# Patient Record
Sex: Male | Born: 1950 | Race: Black or African American | Hispanic: No | Marital: Married | Smoking: Never smoker
Health system: Southern US, Community
[De-identification: ages and names within clinical notes are randomized; demographics above are authoritative.]

## PROBLEM LIST (undated history)

## (undated) DIAGNOSIS — E785 Hyperlipidemia, unspecified: Secondary | ICD-10-CM

## (undated) DIAGNOSIS — E119 Type 2 diabetes mellitus without complications: Secondary | ICD-10-CM

## (undated) DIAGNOSIS — I10 Essential (primary) hypertension: Secondary | ICD-10-CM

## (undated) DIAGNOSIS — M1990 Unspecified osteoarthritis, unspecified site: Secondary | ICD-10-CM

## (undated) HISTORY — DX: Essential (primary) hypertension: I10

## (undated) HISTORY — DX: Hyperlipidemia, unspecified: E78.5

## (undated) HISTORY — DX: Type 2 diabetes mellitus without complications: E11.9

## (undated) HISTORY — PX: COLONOSCOPY: SHX174

## (undated) HISTORY — DX: Unspecified osteoarthritis, unspecified site: M19.90

## (undated) HISTORY — PX: EYE SURGERY: SHX253

---

## 1999-03-08 ENCOUNTER — Ambulatory Visit (HOSPITAL_COMMUNITY): Admission: RE | Admit: 1999-03-08 | Discharge: 1999-03-08 | Payer: Self-pay | Admitting: Specialist

## 1999-03-08 ENCOUNTER — Encounter: Payer: Self-pay | Admitting: Specialist

## 2007-05-14 ENCOUNTER — Ambulatory Visit: Payer: Self-pay | Admitting: Internal Medicine

## 2008-06-05 ENCOUNTER — Ambulatory Visit: Payer: Self-pay | Admitting: Internal Medicine

## 2008-07-03 ENCOUNTER — Ambulatory Visit: Payer: Self-pay | Admitting: Internal Medicine

## 2008-07-03 LAB — CONVERTED CEMR LAB: Microalb, Ur: 2.02 mg/dL — ABNORMAL HIGH (ref 0.00–1.89)

## 2008-07-07 ENCOUNTER — Ambulatory Visit: Payer: Self-pay | Admitting: *Deleted

## 2008-07-09 ENCOUNTER — Encounter: Admission: RE | Admit: 2008-07-09 | Discharge: 2008-10-07 | Payer: Self-pay | Admitting: Internal Medicine

## 2008-09-24 ENCOUNTER — Ambulatory Visit: Payer: Self-pay | Admitting: Internal Medicine

## 2008-12-03 ENCOUNTER — Encounter: Admission: RE | Admit: 2008-12-03 | Discharge: 2008-12-03 | Payer: Self-pay | Admitting: Internal Medicine

## 2009-02-03 ENCOUNTER — Ambulatory Visit: Payer: Self-pay | Admitting: Internal Medicine

## 2009-02-25 ENCOUNTER — Encounter: Admission: RE | Admit: 2009-02-25 | Discharge: 2009-03-18 | Payer: Self-pay | Admitting: Internal Medicine

## 2009-05-18 ENCOUNTER — Encounter: Admission: RE | Admit: 2009-05-18 | Discharge: 2009-05-18 | Payer: Self-pay | Admitting: Internal Medicine

## 2009-06-16 ENCOUNTER — Ambulatory Visit: Payer: Self-pay | Admitting: Internal Medicine

## 2009-08-24 ENCOUNTER — Encounter: Admission: RE | Admit: 2009-08-24 | Discharge: 2009-08-24 | Payer: Self-pay | Admitting: Internal Medicine

## 2009-08-25 ENCOUNTER — Ambulatory Visit: Payer: Self-pay | Admitting: Internal Medicine

## 2009-09-24 DIAGNOSIS — E1165 Type 2 diabetes mellitus with hyperglycemia: Secondary | ICD-10-CM | POA: Insufficient documentation

## 2009-09-24 DIAGNOSIS — Z008 Encounter for other general examination: Secondary | ICD-10-CM | POA: Insufficient documentation

## 2009-09-24 DIAGNOSIS — E785 Hyperlipidemia, unspecified: Secondary | ICD-10-CM | POA: Insufficient documentation

## 2009-09-24 DIAGNOSIS — E119 Type 2 diabetes mellitus without complications: Secondary | ICD-10-CM | POA: Insufficient documentation

## 2009-09-24 DIAGNOSIS — I10 Essential (primary) hypertension: Secondary | ICD-10-CM | POA: Insufficient documentation

## 2009-12-10 ENCOUNTER — Encounter (INDEPENDENT_AMBULATORY_CARE_PROVIDER_SITE_OTHER): Payer: Self-pay | Admitting: Internal Medicine

## 2009-12-10 LAB — CONVERTED CEMR LAB
ALT: 33 units/L (ref 0–53)
AST: 28 units/L (ref 0–37)
Albumin: 4.3 g/dL (ref 3.5–5.2)
Alkaline Phosphatase: 72 units/L (ref 39–117)
BUN: 16 mg/dL (ref 6–23)
CO2: 29 meq/L (ref 19–32)
Calcium: 9.4 mg/dL (ref 8.4–10.5)
Chloride: 103 meq/L (ref 96–112)
Cholesterol: 169 mg/dL (ref 0–200)
Creatinine, Ser: 1.08 mg/dL (ref 0.40–1.50)
Glucose, Bld: 145 mg/dL — ABNORMAL HIGH (ref 70–99)
HDL: 28 mg/dL — ABNORMAL LOW (ref >39)
LDL Cholesterol: 119 mg/dL — ABNORMAL HIGH (ref 0–99)
Microalb, Ur: 2.88 mg/dL — ABNORMAL HIGH (ref 0.00–1.89)
Potassium: 5 meq/L (ref 3.5–5.3)
Sodium: 142 meq/L (ref 135–145)
Total Bilirubin: 0.6 mg/dL (ref 0.3–1.2)
Total CHOL/HDL Ratio: 6
Total Protein: 7 g/dL (ref 6.0–8.3)
Triglycerides: 112 mg/dL (ref <150)
VLDL: 22 mg/dL (ref 0–40)

## 2010-10-14 ENCOUNTER — Other Ambulatory Visit: Payer: Self-pay | Admitting: Gastroenterology

## 2010-10-14 ENCOUNTER — Ambulatory Visit (HOSPITAL_COMMUNITY)
Admission: RE | Admit: 2010-10-14 | Discharge: 2010-10-14 | Disposition: A | Discharge status: Home / Self Care | Payer: Self-pay | Source: Ambulatory Visit | Attending: Gastroenterology | Admitting: Gastroenterology

## 2010-10-14 DIAGNOSIS — D126 Benign neoplasm of colon, unspecified: Secondary | ICD-10-CM | POA: Insufficient documentation

## 2010-10-14 DIAGNOSIS — I10 Essential (primary) hypertension: Secondary | ICD-10-CM | POA: Insufficient documentation

## 2010-10-14 DIAGNOSIS — E119 Type 2 diabetes mellitus without complications: Secondary | ICD-10-CM | POA: Insufficient documentation

## 2010-10-14 LAB — GLUCOSE, CAPILLARY
Glucose-Capillary: 196 mg/dL — ABNORMAL HIGH (ref 70–99)
Glucose-Capillary: 189 mg/dL — ABNORMAL HIGH (ref 70–99)

## 2010-12-01 NOTE — Op Note (Signed)
  NAMEEDREES, RODRICK NO.:  [id number]  MEDICAL RECORD NO.:  [id number]  LOCATION:  [facility]                         FACILITY:  [facility]  PHYSICIAN:  Wonda Horner, M.D.   DATE OF BIRTH:  [birth year]  DATE OF PROCEDURE:  10/14/2010 DATE OF DISCHARGE:                              OPERATIVE REPORT   INDICATIONS FOR PROCEDURE:  Screening.  Informed consent was obtained after explanation of the risks of bleeding, infection, and perforation.  PREMEDICATION:  Fentanyl 75 mcg IV, Versed 5 mg IV.  PROCEDURE:  With the patient in the left lateral decubitus position, a rectal exam was performed.  No masses were felt.  The Pentax colonoscope was inserted into the rectum and advanced around the colon to the cecum. Cecal landmarks were identified.  The cecum and ascending colon were normal.  The transverse colon was normal.  The descending colon was normal.  In the distal sigmoid, there was a small 4-mm polyp biopsied off with forceps.  The rectum was normal.  He tolerated the procedure well without complications.  IMPRESSION:  Sigmoid polyp.  PLAN:  Check pathology.          ______________________________ Wonda Horner, M.D.     SFG/MEDQ  D:  10/14/2010  T:  10/14/2010  Job:  [id number]  cc:   [facility]  Electronically Signed by Anson Fret MD on 12/01/2010 08:48:27 AM

## 2013-12-18 DIAGNOSIS — E11319 Type 2 diabetes mellitus with unspecified diabetic retinopathy without macular edema: Secondary | ICD-10-CM | POA: Insufficient documentation

## 2014-12-16 DIAGNOSIS — I10 Essential (primary) hypertension: Secondary | ICD-10-CM | POA: Diagnosis not present

## 2014-12-16 DIAGNOSIS — E785 Hyperlipidemia, unspecified: Secondary | ICD-10-CM | POA: Diagnosis not present

## 2014-12-16 DIAGNOSIS — Z125 Encounter for screening for malignant neoplasm of prostate: Secondary | ICD-10-CM | POA: Diagnosis not present

## 2014-12-16 DIAGNOSIS — Z0000 Encounter for general adult medical examination without abnormal findings: Secondary | ICD-10-CM | POA: Insufficient documentation

## 2014-12-16 DIAGNOSIS — E1165 Type 2 diabetes mellitus with hyperglycemia: Secondary | ICD-10-CM | POA: Diagnosis not present

## 2014-12-23 DIAGNOSIS — I129 Hypertensive chronic kidney disease with stage 1 through stage 4 chronic kidney disease, or unspecified chronic kidney disease: Secondary | ICD-10-CM | POA: Insufficient documentation

## 2014-12-23 DIAGNOSIS — E785 Hyperlipidemia, unspecified: Secondary | ICD-10-CM | POA: Diagnosis not present

## 2014-12-23 DIAGNOSIS — E1165 Type 2 diabetes mellitus with hyperglycemia: Secondary | ICD-10-CM | POA: Diagnosis not present

## 2014-12-23 DIAGNOSIS — N183 Chronic kidney disease, stage 3 (moderate): Secondary | ICD-10-CM | POA: Diagnosis not present

## 2014-12-23 DIAGNOSIS — E1121 Type 2 diabetes mellitus with diabetic nephropathy: Secondary | ICD-10-CM | POA: Diagnosis not present

## 2014-12-23 DIAGNOSIS — N1831 Chronic kidney disease, stage 3a: Secondary | ICD-10-CM | POA: Insufficient documentation

## 2014-12-23 DIAGNOSIS — E11319 Type 2 diabetes mellitus with unspecified diabetic retinopathy without macular edema: Secondary | ICD-10-CM | POA: Diagnosis not present

## 2014-12-23 DIAGNOSIS — I10 Essential (primary) hypertension: Secondary | ICD-10-CM | POA: Diagnosis not present

## 2014-12-23 DIAGNOSIS — Z0000 Encounter for general adult medical examination without abnormal findings: Secondary | ICD-10-CM | POA: Diagnosis not present

## 2014-12-23 DIAGNOSIS — Z1389 Encounter for screening for other disorder: Secondary | ICD-10-CM | POA: Diagnosis not present

## 2015-03-13 ENCOUNTER — Ambulatory Visit (INDEPENDENT_AMBULATORY_CARE_PROVIDER_SITE_OTHER): Payer: Medicare PPO

## 2015-03-13 ENCOUNTER — Ambulatory Visit (INDEPENDENT_AMBULATORY_CARE_PROVIDER_SITE_OTHER): Payer: Medicare HMO | Admitting: Physician Assistant

## 2015-03-13 VITALS — BP 144/80 | HR 63 | Temp 97.3°F | Resp 20 | Ht 61.0 in | Wt 202.8 lb

## 2015-03-13 DIAGNOSIS — M542 Cervicalgia: Secondary | ICD-10-CM

## 2015-03-13 DIAGNOSIS — R51 Headache: Secondary | ICD-10-CM | POA: Diagnosis not present

## 2015-03-13 DIAGNOSIS — R519 Headache, unspecified: Secondary | ICD-10-CM

## 2015-03-13 MED ORDER — CYCLOBENZAPRINE HCL 10 MG PO TABS: 5.0000 mg | ORAL_TABLET | Freq: Three times a day (TID) | ORAL | Status: DC | PRN

## 2015-03-13 MED ORDER — ACETAMINOPHEN 325 MG PO TABS
1000.0000 mg | ORAL_TABLET | Freq: Once | ORAL | Status: AC
  Administered 2015-03-13: 975 mg via ORAL

## 2015-03-13 NOTE — Patient Instructions (Signed)
Take 400-600 mg of Ibuprofen every 8 hours as needed for pain.
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
## 2015-03-19 NOTE — Progress Notes (Signed)
  Medical screening examination/treatment/procedure(s) were performed by non-physician practitioner and as supervising physician I was immediately available for consultation/collaboration.     

## 2015-06-16 ENCOUNTER — Emergency Department (HOSPITAL_COMMUNITY)
Admission: EM | Admit: 2015-06-16 | Discharge: 2015-06-16 | Disposition: A | Discharge status: Home / Self Care | Payer: Medicare Other | Attending: Emergency Medicine | Admitting: Emergency Medicine

## 2015-06-16 ENCOUNTER — Encounter (HOSPITAL_COMMUNITY): Payer: Self-pay | Admitting: Emergency Medicine

## 2015-06-16 ENCOUNTER — Emergency Department (HOSPITAL_COMMUNITY): Payer: Medicare Other

## 2015-06-16 DIAGNOSIS — E119 Type 2 diabetes mellitus without complications: Secondary | ICD-10-CM | POA: Insufficient documentation

## 2015-06-16 DIAGNOSIS — R7989 Other specified abnormal findings of blood chemistry: Secondary | ICD-10-CM | POA: Diagnosis not present

## 2015-06-16 DIAGNOSIS — R55 Syncope and collapse: Secondary | ICD-10-CM | POA: Insufficient documentation

## 2015-06-16 DIAGNOSIS — Z7984 Long term (current) use of oral hypoglycemic drugs: Secondary | ICD-10-CM | POA: Insufficient documentation

## 2015-06-16 DIAGNOSIS — Z79899 Other long term (current) drug therapy: Secondary | ICD-10-CM | POA: Diagnosis not present

## 2015-06-16 DIAGNOSIS — D649 Anemia, unspecified: Secondary | ICD-10-CM | POA: Insufficient documentation

## 2015-06-16 DIAGNOSIS — R799 Abnormal finding of blood chemistry, unspecified: Secondary | ICD-10-CM

## 2015-06-16 LAB — COMPREHENSIVE METABOLIC PANEL
ALT: 13 U/L — ABNORMAL LOW (ref 17–63)
AST: 20 U/L (ref 15–41)
Albumin: 3.6 g/dL (ref 3.5–5.0)
Alkaline Phosphatase: 75 U/L (ref 38–126)
Anion gap: 11 (ref 5–15)
BUN: 29 mg/dL — ABNORMAL HIGH (ref 6–20)
CO2: 22 mmol/L (ref 22–32)
Calcium: 8.4 mg/dL — ABNORMAL LOW (ref 8.9–10.3)
Chloride: 103 mmol/L (ref 101–111)
Creatinine, Ser: 1.72 mg/dL — ABNORMAL HIGH (ref 0.61–1.24)
GFR calc Af Amer: 46 mL/min — ABNORMAL LOW (ref >60)
GFR calc non Af Amer: 40 mL/min — ABNORMAL LOW (ref >60)
Glucose, Bld: 208 mg/dL — ABNORMAL HIGH (ref 65–99)
Potassium: 4.2 mmol/L (ref 3.5–5.1)
Sodium: 136 mmol/L (ref 135–145)
Total Bilirubin: 0.7 mg/dL (ref 0.3–1.2)
Total Protein: 6.5 g/dL (ref 6.5–8.1)

## 2015-06-16 LAB — CBC WITH DIFFERENTIAL/PLATELET
Basophils Absolute: 0 10*3/uL (ref 0.0–0.1)
Basophils Relative: 0 %
Eosinophils Absolute: 0 10*3/uL (ref 0.0–0.7)
Eosinophils Relative: 1 %
HCT: 30.8 % — ABNORMAL LOW (ref 39.0–52.0)
Hemoglobin: 10.8 g/dL — ABNORMAL LOW (ref 13.0–17.0)
Lymphocytes Relative: 15 %
Lymphs Abs: 0.9 10*3/uL (ref 0.7–4.0)
MCH: 32.1 pg (ref 26.0–34.0)
MCHC: 35.1 g/dL (ref 30.0–36.0)
MCV: 91.7 fL (ref 78.0–100.0)
Monocytes Absolute: 0.8 10*3/uL (ref 0.1–1.0)
Monocytes Relative: 14 %
Neutro Abs: 3.9 10*3/uL (ref 1.7–7.7)
Neutrophils Relative %: 70 %
Platelets: 168 10*3/uL (ref 150–400)
RBC: 3.36 MIL/uL — ABNORMAL LOW (ref 4.22–5.81)
RDW: 11.9 % (ref 11.5–15.5)
WBC: 5.6 10*3/uL (ref 4.0–10.5)

## 2015-06-16 LAB — URINALYSIS, ROUTINE W REFLEX MICROSCOPIC
Bilirubin Urine: NEGATIVE
Glucose, UA: 100 mg/dL — AB
Hgb urine dipstick: NEGATIVE
Ketones, ur: NEGATIVE mg/dL
Leukocytes, UA: NEGATIVE
Nitrite: NEGATIVE
Protein, ur: 30 mg/dL — AB
Specific Gravity, Urine: 1.013 (ref 1.005–1.030)
pH: 5.5 (ref 5.0–8.0)

## 2015-06-16 LAB — URINE MICROSCOPIC-ADD ON: Bacteria, UA: NONE SEEN

## 2015-06-16 LAB — CBG MONITORING, ED
Glucose-Capillary: 284 mg/dL — ABNORMAL HIGH (ref 65–99)
Glucose-Capillary: 224 mg/dL — ABNORMAL HIGH (ref 65–99)

## 2015-06-16 LAB — I-STAT TROPONIN, ED: Troponin i, poc: 0.01 ng/mL (ref 0.00–0.08)

## 2015-06-16 LAB — POC OCCULT BLOOD, ED: Fecal Occult Bld: NEGATIVE

## 2015-06-16 MED ORDER — DEXTROMETHORPHAN HBR 15 MG/5ML PO SYRP: 10.0000 mL | ORAL_SOLUTION | Freq: Four times a day (QID) | ORAL | Status: DC | PRN

## 2015-06-16 MED ORDER — SODIUM CHLORIDE 0.9 % IV BOLUS (SEPSIS)
1000.0000 mL | Freq: Once | INTRAVENOUS | Status: AC
  Administered 2015-06-16: 1000 mL via INTRAVENOUS

## 2015-06-16 NOTE — Discharge Instructions (Signed)
Medications: Dextromethorphan  Treatment: Take dextromethorphan every 4 hours as needed for cough.   Follow-up: Please follow up with your primary care doctor for further evaluation of your anemia and elevated BUN and Creatinine. Please return to the emergency department or contact your doctor if you experience similar symptoms, develop a fever, or any other concerning symptom.   Anemia, Nonspecific Anemia is a condition in which the concentration of red blood cells or hemoglobin in the blood is below normal. Hemoglobin is a substance in red blood cells that carries oxygen to the tissues of the body. Anemia results in not enough oxygen reaching these tissues.  CAUSES  Common causes of anemia include:   Excessive bleeding. Bleeding may be internal or external. This includes excessive bleeding from periods (in women) or from the intestine.   Poor nutrition.   Chronic kidney, thyroid, and liver disease.  Bone marrow disorders that decrease red blood cell production.  Cancer and treatments for cancer.  HIV, AIDS, and their treatments.  Spleen problems that increase red blood cell destruction.  Blood disorders.  Excess destruction of red blood cells due to infection, medicines, and autoimmune disorders. SIGNS AND SYMPTOMS   Minor weakness.   Dizziness.   Headache.  Palpitations.   Shortness of breath, especially with exercise.   Paleness.  Cold sensitivity.  Indigestion.  Nausea.  Difficulty sleeping.  Difficulty concentrating. Symptoms may occur suddenly or they may develop slowly.  DIAGNOSIS  Additional blood tests are often needed. These help your health care provider determine the best treatment. Your health care provider will check your stool for blood and look for other causes of blood loss.  TREATMENT  Treatment varies depending on the cause of the anemia. Treatment can include:   Supplements of iron, vitamin 123456, or folic acid.   Hormone  medicines.   A blood transfusion. This may be needed if blood loss is severe.   Hospitalization. This may be needed if there is significant continual blood loss.   Dietary changes.  Spleen removal. HOME CARE INSTRUCTIONS Keep all follow-up appointments. It often takes many weeks to correct anemia, and having your health care provider check on your condition and your response to treatment is very important. SEEK IMMEDIATE MEDICAL CARE IF:   You develop extreme weakness, shortness of breath, or chest pain.   You become dizzy or have trouble concentrating.  You develop heavy vaginal bleeding.   You develop a rash.   You have bloody or black, tarry stools.   You faint.   You vomit up blood.   You vomit repeatedly.   You have abdominal pain.  You have a fever or persistent symptoms for more than 2-3 days.   You have a fever and your symptoms suddenly get worse.   You are dehydrated.  MAKE SURE YOU:  Understand these instructions.  Will watch your condition.  Will get help right away if you are not doing well or get worse.   This information is not intended to replace advice given to you by your health care provider. Make sure you discuss any questions you have with your health care provider.   Document Released: 04/14/2004 Document Revised: 11/07/2012 Document Reviewed: 08/31/2012 Elsevier Interactive Patient Education [employer].

## 2015-06-16 NOTE — Progress Notes (Signed)
Patient confirms his pcp is Dr. Velna Hatchet and has an upcoming appointment on April 17th.  System updated.

## 2015-06-16 NOTE — ED Notes (Signed)
Per EMS, patient had near syncopal event while walking up stairs at apartment. PTAR stated her BP was 70 palpated. Blood pressure was 116/41 with [facility].

## 2015-06-16 NOTE — ED Provider Notes (Signed)
CSN: [id number]     Arrival date & time 06/16/15  1139 History   First MD Initiated Contact with Patient 06/16/15 1234     Chief Complaint  Patient presents with  . Near Syncope     (Consider location/radiation/quality/duration/timing/severity/associated sxs/prior Treatment) HPI Comments: Patient is a very pleasant 65yo male who presents today after an episode of near syncope. Patient was walking up stairs today when he began feeling lightheaded, warm, and clammy. Patient has had a nonproductive cough for the past few days. He has been taking Nyquil at night with good relief. Patient took Nyquil this morning for his cough, ate breakfast, and was feeling good. About 2-3 hours following taking Nyquil, patient had this near syncope episode. Pt states he feels great now and was cracking jokes during most of my time with him. He is still coughing. Wife stated that he felt warm in bed last night but has had no documented fever. Pt has had good appetite. Patient does not take his blood sugar regularly but his last A1c was 6.1. Most recent blood sugar at home unknown.  Patient is a 65 y.o. male presenting with near-syncope. The history is provided by the patient.  Near Syncope Pertinent negatives include no abdominal pain, chest pain, chills, fever, headaches, nausea, rash, sore throat or vomiting.    Past Medical History  Diagnosis Date  . Diabetes mellitus without complication ([place])   . Hypertension    History reviewed. No pertinent past surgical history. Family History  Problem Relation Age of Onset  . Diabetes Mother   . Diabetes Father   . Diabetes Sister   . Diabetes Brother    Social History  Substance Use Topics  . Smoking status: Never Smoker   . Smokeless tobacco: None  . Alcohol Use: 1.2 oz/week    2 Standard drinks or equivalent per week    Review of Systems  Constitutional: Negative for fever and chills.  HENT: Negative for facial swelling and sore throat.   Respiratory:  Negative for shortness of breath.   Cardiovascular: Positive for near-syncope. Negative for chest pain.  Gastrointestinal: Negative for nausea, vomiting and abdominal pain.  Genitourinary: Negative for dysuria.  Musculoskeletal: Negative for back pain.  Skin: Negative for rash and wound.  Neurological: Negative for light-headedness and headaches.  Psychiatric/Behavioral: The patient is not nervous/anxious.       Allergies  Review of patient's allergies indicates no known allergies.  Home Medications   Prior to Admission medications   Medication Sig Start Date End Date Taking? Authorizing Provider  glipiZIDE (GLUCOTROL XL) 10 MG 24 hr tablet Take 10 mg by mouth daily. 05/26/15  Yes Historical Provider, MD  lisinopril-hydrochlorothiazide (PRINZIDE,ZESTORETIC) 10-12.5 MG tablet Take 1 tablet by mouth daily. 05/26/15  Yes Historical Provider, MD  Pseudoeph-Doxylamine-DM-APAP (NYQUIL PO) Take 1 Dose by mouth daily as needed (cold symptoms).   Yes Historical Provider, MD  sitaGLIPtin-metformin (JANUMET) 50-1000 MG tablet Take 1 tablet by mouth 2 (two) times daily with a meal.   Yes Historical Provider, MD  cyclobenzaprine (FLEXERIL) 10 MG tablet Take 0.5-1 tablets (5-10 mg total) by mouth 3 (three) times daily as needed for muscle spasms. 03/13/15   Tereasa Coop, PA-C  dextromethorphan 15 MG/5ML syrup Take 10 mLs (30 mg total) by mouth 4 (four) times daily as needed for cough. 06/16/15   Abigail Teall M Kamren Heintzelman, PA-C   BP 141/75 mmHg  Pulse 74  Temp(Src) 98.3 F (36.8 C) (Oral)  Resp 16  SpO2 95% Physical  Exam  Constitutional: He appears well-developed and well-nourished. No distress.  HENT:  Head: Normocephalic and atraumatic.  Eyes: Conjunctivae and EOM are normal. Pupils are equal, round, and reactive to light. Right eye exhibits no discharge. Left eye exhibits no discharge. No scleral icterus.  Neck: Normal range of motion. Neck supple. No thyromegaly present.  Cardiovascular: Normal rate,  regular rhythm, normal heart sounds and intact distal pulses.  Exam reveals no gallop and no friction rub.   No murmur heard. Pulmonary/Chest: Effort normal and breath sounds normal. No stridor. No respiratory distress. He has no wheezes. He has no rales.  Abdominal: Soft. Bowel sounds are normal. He exhibits no distension, no abdominal bruit, no pulsatile midline mass and no mass. There is no tenderness. There is no rebound and no guarding.  Musculoskeletal: He exhibits no edema.  Lymphadenopathy:    He has no cervical adenopathy.  Neurological: He is alert. Coordination normal.  CN 3-12 intact, normal sensation, 5/5 strength throughout, good coordination on finger to nose evaluation; no facial drooping or slurring of speech  Skin: Skin is warm and dry. No rash noted. He is not diaphoretic. No pallor.  Psychiatric: He has a normal mood and affect.  Nursing note and vitals reviewed.   ED Course  Procedures (including critical care time) Labs Review Labs Reviewed  CBC WITH DIFFERENTIAL/PLATELET - Abnormal; Notable for the following:    RBC 3.36 (*)    Hemoglobin 10.8 (*)    HCT 30.8 (*)    All other components within normal limits  COMPREHENSIVE METABOLIC PANEL - Abnormal; Notable for the following:    Glucose, Bld 208 (*)    BUN 29 (*)    Creatinine, Ser 1.72 (*)    Calcium 8.4 (*)    ALT 13 (*)    GFR calc non Af Amer 40 (*)    GFR calc Af Amer 46 (*)    All other components within normal limits  CBG MONITORING, ED - Abnormal; Notable for the following:    Glucose-Capillary 284 (*)    All other components within normal limits  CBG MONITORING, ED - Abnormal; Notable for the following:    Glucose-Capillary 224 (*)    All other components within normal limits  URINALYSIS, ROUTINE W REFLEX MICROSCOPIC (NOT AT [facility])  POCT CBG (FASTING - GLUCOSE)-MANUAL ENTRY  I-STAT TROPOININ, ED  POC OCCULT BLOOD, ED    Imaging Review Dg Chest 2 View  06/16/2015  CLINICAL DATA:  Cough for 3  days. Near syncopal episode earlier today EXAM: CHEST  2 VIEW COMPARISON:  None. FINDINGS: Lungs are clear. Heart size and pulmonary vascularity are normal. No adenopathy. No bone lesions. IMPRESSION: No edema or consolidation. Electronically Signed   By: Lowella Grip III M.D.   On: 06/16/2015 14:55   I have personally reviewed and evaluated these images and lab results as part of my medical decision-making.   EKG Interpretation   Date/Time:  Tuesday June 16 2015 11:59:39 EDT Ventricular Rate:  67 PR Interval:  141 QRS Duration: 80 QT Interval:  378 QTC Calculation: 399 R Axis:   64 Text Interpretation:  Sinus rhythm Borderline repolarization abnormality  Confirmed by Jeneen Rinks  MD, [place] ([id number]) on 06/16/2015 1:51:46 PM      MDM   Patient is 65yo with a hx of DM and HTN who had a near syncopal episode this morning. He is "feeling great now." Patient was cracking jokes during the majority of my H&P. Troponin neg. CBC shows anemia,  HGB 10.8, HCT 30.8. CMP shows elevated BUN and Cr. Glucose 284, 224. UA unremarkable besides glucose. Fecal occult negative. CXR shows no edema or consolidation.  Suspected cause is anemia or possibly took too much lisinopril this morning. Patient realized in our discharge conversation that his pills changed and he wasn't sure if he took them correctly this morning and may have taken too much. Other possible etiology is his morning Nyquil intake. Patient discharged with daytime cough medicine dextromethorphan. Encouraged to drink fluids. Pt to follow up with PCP regarding anemia and elevated BUN and Cr. Patient also evaluated by Dr. Dorie Rank who is in agreement with the plan.  Patient understands and agrees with the plan.    Final diagnoses:  Near syncope  Anemia, unspecified anemia type       Frederica Kuster, PA-C 06/16/15 [place], PA-C 06/16/15 [place], PA-C 06/16/15 1724  Dorie Rank, MD 06/17/15 [phone]

## 2015-07-06 DIAGNOSIS — D649 Anemia, unspecified: Secondary | ICD-10-CM | POA: Insufficient documentation

## 2015-07-06 DIAGNOSIS — N179 Acute kidney failure, unspecified: Secondary | ICD-10-CM | POA: Insufficient documentation

## 2015-12-04 LAB — GLUCOSE, POCT (MANUAL RESULT ENTRY): POC GLUCOSE: 257 mg/dL — AB (ref 70–99)

## 2018-02-12 DIAGNOSIS — Z91199 Patient's noncompliance with other medical treatment and regimen due to unspecified reason: Secondary | ICD-10-CM | POA: Insufficient documentation

## 2019-04-22 DIAGNOSIS — I10 Essential (primary) hypertension: Secondary | ICD-10-CM | POA: Diagnosis not present

## 2019-04-22 DIAGNOSIS — Z7984 Long term (current) use of oral hypoglycemic drugs: Secondary | ICD-10-CM | POA: Diagnosis not present

## 2019-04-22 DIAGNOSIS — E119 Type 2 diabetes mellitus without complications: Secondary | ICD-10-CM | POA: Diagnosis not present

## 2019-04-26 DIAGNOSIS — H2513 Age-related nuclear cataract, bilateral: Secondary | ICD-10-CM | POA: Diagnosis not present

## 2019-04-26 DIAGNOSIS — H3582 Retinal ischemia: Secondary | ICD-10-CM | POA: Diagnosis not present

## 2019-04-26 DIAGNOSIS — E113593 Type 2 diabetes mellitus with proliferative diabetic retinopathy without macular edema, bilateral: Secondary | ICD-10-CM | POA: Diagnosis not present

## 2019-05-02 DIAGNOSIS — E1165 Type 2 diabetes mellitus with hyperglycemia: Secondary | ICD-10-CM | POA: Diagnosis not present

## 2019-05-03 DIAGNOSIS — E1165 Type 2 diabetes mellitus with hyperglycemia: Secondary | ICD-10-CM | POA: Diagnosis not present

## 2019-05-03 DIAGNOSIS — N1832 Chronic kidney disease, stage 3b: Secondary | ICD-10-CM | POA: Diagnosis not present

## 2019-05-03 DIAGNOSIS — I129 Hypertensive chronic kidney disease with stage 1 through stage 4 chronic kidney disease, or unspecified chronic kidney disease: Secondary | ICD-10-CM | POA: Diagnosis not present

## 2019-09-18 DIAGNOSIS — E11319 Type 2 diabetes mellitus with unspecified diabetic retinopathy without macular edema: Secondary | ICD-10-CM | POA: Diagnosis not present

## 2019-09-18 DIAGNOSIS — N1832 Chronic kidney disease, stage 3b: Secondary | ICD-10-CM | POA: Diagnosis not present

## 2019-09-18 DIAGNOSIS — I129 Hypertensive chronic kidney disease with stage 1 through stage 4 chronic kidney disease, or unspecified chronic kidney disease: Secondary | ICD-10-CM | POA: Diagnosis not present

## 2019-09-18 DIAGNOSIS — E785 Hyperlipidemia, unspecified: Secondary | ICD-10-CM | POA: Diagnosis not present

## 2019-09-18 DIAGNOSIS — E1165 Type 2 diabetes mellitus with hyperglycemia: Secondary | ICD-10-CM | POA: Diagnosis not present

## 2019-11-15 DIAGNOSIS — B351 Tinea unguium: Secondary | ICD-10-CM | POA: Diagnosis not present

## 2019-11-15 DIAGNOSIS — M19071 Primary osteoarthritis, right ankle and foot: Secondary | ICD-10-CM | POA: Diagnosis not present

## 2019-11-21 ENCOUNTER — Ambulatory Visit: Payer: Medicare HMO | Admitting: Podiatry

## 2019-11-22 DIAGNOSIS — B351 Tinea unguium: Secondary | ICD-10-CM | POA: Diagnosis not present

## 2019-12-13 DIAGNOSIS — H3582 Retinal ischemia: Secondary | ICD-10-CM | POA: Diagnosis not present

## 2019-12-13 DIAGNOSIS — E113593 Type 2 diabetes mellitus with proliferative diabetic retinopathy without macular edema, bilateral: Secondary | ICD-10-CM | POA: Diagnosis not present

## 2019-12-18 DIAGNOSIS — N1831 Chronic kidney disease, stage 3a: Secondary | ICD-10-CM | POA: Diagnosis not present

## 2019-12-18 DIAGNOSIS — I129 Hypertensive chronic kidney disease with stage 1 through stage 4 chronic kidney disease, or unspecified chronic kidney disease: Secondary | ICD-10-CM | POA: Diagnosis not present

## 2019-12-18 DIAGNOSIS — E1121 Type 2 diabetes mellitus with diabetic nephropathy: Secondary | ICD-10-CM | POA: Diagnosis not present

## 2019-12-20 DIAGNOSIS — B351 Tinea unguium: Secondary | ICD-10-CM | POA: Diagnosis not present

## 2020-01-24 DIAGNOSIS — B351 Tinea unguium: Secondary | ICD-10-CM | POA: Diagnosis not present

## 2020-01-24 DIAGNOSIS — Z7689 Persons encountering health services in other specified circumstances: Secondary | ICD-10-CM | POA: Diagnosis not present

## 2020-01-29 DIAGNOSIS — E1121 Type 2 diabetes mellitus with diabetic nephropathy: Secondary | ICD-10-CM | POA: Diagnosis not present

## 2020-01-29 DIAGNOSIS — I129 Hypertensive chronic kidney disease with stage 1 through stage 4 chronic kidney disease, or unspecified chronic kidney disease: Secondary | ICD-10-CM | POA: Diagnosis not present

## 2020-01-29 DIAGNOSIS — N1832 Chronic kidney disease, stage 3b: Secondary | ICD-10-CM | POA: Diagnosis not present

## 2020-02-21 DIAGNOSIS — B351 Tinea unguium: Secondary | ICD-10-CM | POA: Diagnosis not present

## 2020-02-27 DIAGNOSIS — Z125 Encounter for screening for malignant neoplasm of prostate: Secondary | ICD-10-CM | POA: Diagnosis not present

## 2020-02-27 DIAGNOSIS — E1165 Type 2 diabetes mellitus with hyperglycemia: Secondary | ICD-10-CM | POA: Diagnosis not present

## 2020-02-27 DIAGNOSIS — E785 Hyperlipidemia, unspecified: Secondary | ICD-10-CM | POA: Diagnosis not present

## 2020-03-05 DIAGNOSIS — I129 Hypertensive chronic kidney disease with stage 1 through stage 4 chronic kidney disease, or unspecified chronic kidney disease: Secondary | ICD-10-CM | POA: Diagnosis not present

## 2020-03-05 DIAGNOSIS — N1832 Chronic kidney disease, stage 3b: Secondary | ICD-10-CM | POA: Diagnosis not present

## 2020-03-05 DIAGNOSIS — E1121 Type 2 diabetes mellitus with diabetic nephropathy: Secondary | ICD-10-CM | POA: Diagnosis not present

## 2020-03-05 DIAGNOSIS — Z0000 Encounter for general adult medical examination without abnormal findings: Secondary | ICD-10-CM | POA: Diagnosis not present

## 2020-03-05 DIAGNOSIS — R82998 Other abnormal findings in urine: Secondary | ICD-10-CM | POA: Diagnosis not present

## 2020-03-05 DIAGNOSIS — Z794 Long term (current) use of insulin: Secondary | ICD-10-CM | POA: Diagnosis not present

## 2020-03-05 DIAGNOSIS — E785 Hyperlipidemia, unspecified: Secondary | ICD-10-CM | POA: Diagnosis not present

## 2020-03-05 DIAGNOSIS — I10 Essential (primary) hypertension: Secondary | ICD-10-CM | POA: Diagnosis not present

## 2020-04-16 DIAGNOSIS — I129 Hypertensive chronic kidney disease with stage 1 through stage 4 chronic kidney disease, or unspecified chronic kidney disease: Secondary | ICD-10-CM | POA: Diagnosis not present

## 2020-04-16 DIAGNOSIS — E1121 Type 2 diabetes mellitus with diabetic nephropathy: Secondary | ICD-10-CM | POA: Diagnosis not present

## 2020-04-16 DIAGNOSIS — N1832 Chronic kidney disease, stage 3b: Secondary | ICD-10-CM | POA: Diagnosis not present

## 2020-05-20 DIAGNOSIS — N1832 Chronic kidney disease, stage 3b: Secondary | ICD-10-CM | POA: Diagnosis not present

## 2020-05-20 DIAGNOSIS — E1121 Type 2 diabetes mellitus with diabetic nephropathy: Secondary | ICD-10-CM | POA: Diagnosis not present

## 2020-05-20 DIAGNOSIS — Z794 Long term (current) use of insulin: Secondary | ICD-10-CM | POA: Diagnosis not present

## 2020-05-20 DIAGNOSIS — E785 Hyperlipidemia, unspecified: Secondary | ICD-10-CM | POA: Diagnosis not present

## 2020-05-20 DIAGNOSIS — I129 Hypertensive chronic kidney disease with stage 1 through stage 4 chronic kidney disease, or unspecified chronic kidney disease: Secondary | ICD-10-CM | POA: Diagnosis not present

## 2020-05-22 DIAGNOSIS — B351 Tinea unguium: Secondary | ICD-10-CM | POA: Diagnosis not present

## 2020-05-25 DIAGNOSIS — E785 Hyperlipidemia, unspecified: Secondary | ICD-10-CM | POA: Diagnosis not present

## 2020-05-25 DIAGNOSIS — N1832 Chronic kidney disease, stage 3b: Secondary | ICD-10-CM | POA: Diagnosis not present

## 2020-05-25 DIAGNOSIS — Z794 Long term (current) use of insulin: Secondary | ICD-10-CM | POA: Diagnosis not present

## 2020-05-25 DIAGNOSIS — R600 Localized edema: Secondary | ICD-10-CM | POA: Diagnosis not present

## 2020-05-25 DIAGNOSIS — E1121 Type 2 diabetes mellitus with diabetic nephropathy: Secondary | ICD-10-CM | POA: Diagnosis not present

## 2020-05-25 DIAGNOSIS — I129 Hypertensive chronic kidney disease with stage 1 through stage 4 chronic kidney disease, or unspecified chronic kidney disease: Secondary | ICD-10-CM | POA: Diagnosis not present

## 2020-06-10 DIAGNOSIS — E1142 Type 2 diabetes mellitus with diabetic polyneuropathy: Secondary | ICD-10-CM | POA: Diagnosis not present

## 2020-06-10 DIAGNOSIS — Z7722 Contact with and (suspected) exposure to environmental tobacco smoke (acute) (chronic): Secondary | ICD-10-CM | POA: Diagnosis not present

## 2020-06-10 DIAGNOSIS — Z794 Long term (current) use of insulin: Secondary | ICD-10-CM | POA: Diagnosis not present

## 2020-06-10 DIAGNOSIS — Z809 Family history of malignant neoplasm, unspecified: Secondary | ICD-10-CM | POA: Diagnosis not present

## 2020-06-10 DIAGNOSIS — Z8249 Family history of ischemic heart disease and other diseases of the circulatory system: Secondary | ICD-10-CM | POA: Diagnosis not present

## 2020-06-10 DIAGNOSIS — I10 Essential (primary) hypertension: Secondary | ICD-10-CM | POA: Diagnosis not present

## 2020-06-10 DIAGNOSIS — Z833 Family history of diabetes mellitus: Secondary | ICD-10-CM | POA: Diagnosis not present

## 2020-06-10 DIAGNOSIS — Z7984 Long term (current) use of oral hypoglycemic drugs: Secondary | ICD-10-CM | POA: Diagnosis not present

## 2020-06-10 DIAGNOSIS — E1165 Type 2 diabetes mellitus with hyperglycemia: Secondary | ICD-10-CM | POA: Diagnosis not present

## 2020-06-19 DIAGNOSIS — E113591 Type 2 diabetes mellitus with proliferative diabetic retinopathy without macular edema, right eye: Secondary | ICD-10-CM | POA: Diagnosis not present

## 2020-06-19 DIAGNOSIS — H2513 Age-related nuclear cataract, bilateral: Secondary | ICD-10-CM | POA: Diagnosis not present

## 2020-06-19 DIAGNOSIS — E113512 Type 2 diabetes mellitus with proliferative diabetic retinopathy with macular edema, left eye: Secondary | ICD-10-CM | POA: Diagnosis not present

## 2020-08-21 DIAGNOSIS — B351 Tinea unguium: Secondary | ICD-10-CM | POA: Diagnosis not present

## 2020-08-26 DIAGNOSIS — N1832 Chronic kidney disease, stage 3b: Secondary | ICD-10-CM | POA: Diagnosis not present

## 2020-08-26 DIAGNOSIS — E1121 Type 2 diabetes mellitus with diabetic nephropathy: Secondary | ICD-10-CM | POA: Diagnosis not present

## 2020-08-26 DIAGNOSIS — Z794 Long term (current) use of insulin: Secondary | ICD-10-CM | POA: Diagnosis not present

## 2020-08-26 DIAGNOSIS — E785 Hyperlipidemia, unspecified: Secondary | ICD-10-CM | POA: Diagnosis not present

## 2020-08-26 DIAGNOSIS — I129 Hypertensive chronic kidney disease with stage 1 through stage 4 chronic kidney disease, or unspecified chronic kidney disease: Secondary | ICD-10-CM | POA: Diagnosis not present

## 2020-10-30 DIAGNOSIS — B351 Tinea unguium: Secondary | ICD-10-CM | POA: Diagnosis not present

## 2020-11-30 DIAGNOSIS — N1832 Chronic kidney disease, stage 3b: Secondary | ICD-10-CM | POA: Diagnosis not present

## 2020-11-30 DIAGNOSIS — I10 Essential (primary) hypertension: Secondary | ICD-10-CM | POA: Diagnosis not present

## 2020-11-30 DIAGNOSIS — E785 Hyperlipidemia, unspecified: Secondary | ICD-10-CM | POA: Diagnosis not present

## 2020-11-30 DIAGNOSIS — E1121 Type 2 diabetes mellitus with diabetic nephropathy: Secondary | ICD-10-CM | POA: Diagnosis not present

## 2020-11-30 DIAGNOSIS — Z794 Long term (current) use of insulin: Secondary | ICD-10-CM | POA: Diagnosis not present

## 2020-12-01 ENCOUNTER — Other Ambulatory Visit: Payer: Self-pay

## 2020-12-01 NOTE — Patient Outreach (Signed)
Aging Gracefully Program  12/01/2020  Gene Fletcher 1950-06-25 [id number]  [facility] Evaluation Interviewer made contact with patient. Aging Gracefully survey completed.   Interviewer will send referral to [name], RN and OT for follow up.   [place] [facility] Management Assistant  [phone]

## 2020-12-11 ENCOUNTER — Other Ambulatory Visit: Payer: Self-pay

## 2020-12-11 ENCOUNTER — Other Ambulatory Visit: Payer: Medicare Other | Admitting: Occupational Therapy

## 2020-12-11 NOTE — Patient Instructions (Signed)
Goals Addressed             This Visit's Progress    Patient Stated       To feel more safe when showering (walk in shower, hand held shower, grab bars, shower seat)     Patient Stated       Feel more safe with bedroom floors (stretching carpet)     Patient Stated       Feel more safe getting in and out their back door. (Fix back door lock so they can get out, small threshold ramp from porch into house so a wheelchair could get in, ramp at back door v. Fixing steps only)     Patient Stated       Gutters--potentially installing gutter guards so he does not have to get up on ladder to clean gutters.

## 2020-12-11 NOTE — Patient Outreach (Signed)
Aging Gracefully Program  OT Initial Visit  12/11/2020  Gene Fletcher 1950-12-05 [id number]  Visit:  1- Initial Visit  Start Time:  0930 End Time:  7867 Total Minutes:  60  CCAP: Typical Daily Routine: Typical Daily Routine:: Gets up, washes up, makes breakfast for he and wife, run errands-crossword books, walking, driving around town, shopping What Types Of Care Problems Are You Having Throughout The Day?: Harder than my wife to get up and down from surfaces. What Kind Of Help Do You Receive?: none Do You Think You Need Other Types Of Help?: home modificatons What Do You Think Would Make Everyday Life Easier For You?: ramp, walk in shower What Is A Good Day Like?: When we can get out and do something What Is A Bad Day Like?: When we are stuck in the house (raining) Do You Have Time For Yourself?: yes Patient Reported Equipment: Other Equipment:: tub bench, quad cane, transport chair Functional Mobility-Maintain Balance While Showering: Maintaining Balance While Showering: A Little Difficulty (sometimes I get dizzy if I turn too fast) Do You:: Use A Device Importance Of Learning New Strategies:: Moderate Safety: A Little Risk Efficiency: Somewhat Intervention: Yes Other Comments:: walk in shower, grab bars, hand held shower head, shower seat. Functional Mobility-Move In And Out Of Chair: Move In and Out Of A Chair: A Little Difficulty Do You:: No Device/No Assistance Importance Of Learning New Strategies:: A Little Observation: Move In And Out Of Chair: Independent With Pain, Difficulty, Or Use Of Device Safety: No Risk Efficiency: Somewhat Intervention: Yes Other Comments::  (Physical therapy) Functional Mobility-Get On And Off Toilet: Getting Up From The Floor: Moderate Difficulty Do You:: No Device/No Assistance Importance Of Learning New Strategies:: A Little Intervention: Yes Other Comments:: go over hand out; maybe physical therapy  Readiness To Change  Score:  Readiness to Change Score: 10  Home Environment Assessment: Outside Home Entry:: Front porch has on hand rail but no railings around the porch. Back door has uneven steps and back screen porch door is broken as well as back door into house lock is broken (so they can't open the door). Bathroom:: [employer] has a tub/shower combo with one lower grab bar. Master bath has tub/shower combo with hand held shower and 4 grab bars. Master Bedroom:: Carpet is rolling Other Home Environment Concerns:: Cleaning gutters   Patient Education: Safety at home Checklist and getting up from a fall    Goals:  Goals Addressed             This Visit's Progress    Patient Stated       To feel more safe when showering (walk in shower, hand held shower, grab bars, shower seat)     Patient Stated       Feel more safe with bedroom floors (stretching carpet)     Patient Stated       Feel more safe getting in and out their back door. (Fix back door lock so they can get out, small threshold ramp from porch into house so a wheelchair could get in, ramp at back door v. Fixing steps only)     Patient Stated       Gutters--potentially installing gutter guards so he does not have to get up on ladder to clean gutters.        Post Clinical Reasoning: Clinician View Of Client Situation:: Mr. Blanch Media has DM and reports he is taking better care of himself these days to get his A1C  down. He tries his best to watch what he eats. He tries to stay active for his physcial and mental health. He is an outgoing gentleman who loves to be around others.He and his wife help out other older ladies with getting errands run.He cooks breakfast for he and his wife. He likes to do the shopping. He has thought about aspects of their home that they would like modified/made more safe and he shared them with me. He has a great sense of humor. Client View Of His/Her Situation:: He feels he is doing really well for his age (he reports  he does have more of an issue with getting up and down from surfaces), He wants to keep as active as possible and likes to stay around those younger than him to help keep him young as well as helping those in need. Next Visit Plan:: Shower seat

## 2020-12-16 ENCOUNTER — Other Ambulatory Visit: Payer: Self-pay

## 2020-12-16 NOTE — Patient Outreach (Signed)
Aging Gracefully Program  RN Visit  12/16/2020  Gene Fletcher [birth year] [id number]  Visit:   RN Initial home visit  Start Time:   1100 End Time:   1200 Total Minutes:   60  Readiness To Change Score:     Universal RN Interventions: Calendar Distribution: Yes Exercise Review: No Medications: Yes Medication Changes: Yes Mood: Yes Pain: Yes PCP Advocacy/Support: No Fall Prevention: Yes Incontinence: Yes Clinician View Of Client Situation: Home neat and clean. Ambulating well with no assistive devices. Happy joking patient. Able to self manage well but not will to self monitor CBG due to not like to have his finger stuck. After visit patient needed to get on a zoom call. Client View Of His/Her Situation: Patient reports that he is working on his DM. Reports he has been doing better. Has elimated juice. States he is foody. Reports he was started on insulin and his wife gives it to him.  Reports he is supposed to take 70/30 twice a day but only takes it once a day.  Reports he stays positive.  Healthcare Provider Communication: Did Higher education careers adviser With [employer] Provider?: No According to Client, Did PCP Report Communication With An Aging Gracefully RN?: No  Clinician View of Client Situation: Clinician View Of Client Situation: Home neat and clean. Ambulating well with no assistive devices. Happy joking patient. Able to self manage well but not will to self monitor CBG due to not like to have his finger stuck. After visit patient needed to get on a zoom call. Client's View of His/Her Situation: Client View Of His/Her Situation: Patient reports that he is working on his DM. Reports he has been doing better. Has elimated juice. States he is foody. Reports he was started on insulin and his wife gives it to him.  Reports he is supposed to take 70/30 twice a day but only takes it once a day.  Reports he stays positive.  Medication Assessment: Do You Have Any Problems Paying  For Medications?: No Where Does Client Store Medications?: Other: (bedroom) Can Client Read Pill Bottles?: No Does Client Use A Pillbox?: Yes Does Anyone Assist Client In Filling Pillbox?: No Does Anyone Assist Client In Taking Medications?: Yes Name Of Person Assisting With Medications: wife gives him his insuin Relationship Of Person Assisting With Medications: wife Do You Take Vitamin D?: No Does Client Have Any Questions Or Concerns About Medictions?: No Is Client Complaining Of Any Symptoms That Could Be Side Effects To Medications?: No Any Possible Changes In Medication Regimen?: No  OT Update: pending [facility] assessment  Session Summary:Very nice patient who is appreciative of the AG program. Poor DM control and not self monitoring. Reviewed importance of DM control and will support patient in improving DM control.    Goals Addressed               This Visit's Progress     Patient Stated (pt-stated)        Aging Gracefully RN:  Goal: Patient states he will self monitor CBG ( with wife's assistance) 2 times per day.  Assessment: not self monitoring at all. Assisted patient with checking CBG during home visit . Reading 233.   Interventions: long discussion with patient about how 70/30 insulin works. Reviewed the importance of self monitoring and recording CBG readings to help MD figure out best dose of insulin. Reviewed with patient the importance of good DM control. Reviewed DM diet and avoiding " white things"  Report  he has DM educations and needs to read it.  PLAN: next home visit scheduled. Encouraged patient to self monitor.   Gene Rand RN, BSN, Careers information officer for [employer] Mobile: [phone]          Gene Rand RN, BSN, Careers information officer for [employer] Mobile: [phone]

## 2020-12-16 NOTE — Patient Instructions (Signed)
Visit Information  PATIENT GOALS:  Goals Addressed               This Visit's Progress     Patient Stated (pt-stated)        Aging Gracefully RN:  Goal: Patient states he will self monitor CBG ( with wife's assistance) 2 times per day.  Assessment: not self monitoring at all. Assisted patient with checking CBG during home visit . Reading 233.   Interventions: long discussion with patient about how 70/30 insulin works. Reviewed the importance of self monitoring and recording CBG readings to help MD figure out best dose of insulin. Reviewed with patient the importance of good DM control. Reviewed DM diet and avoiding " white things"  Report he has DM educations and needs to read it.  PLAN: next home visit scheduled. Encouraged patient to self monitor.   [name] RN, BSN, Careers information officer for [employer] Mobile: [phone]

## 2021-01-13 ENCOUNTER — Other Ambulatory Visit: Payer: Self-pay

## 2021-01-13 NOTE — Patient Outreach (Signed)
Aging Gracefully Program  RN Visit  01/13/2021  Gene Fletcher March 18, 1951 [id number]  Visit:   RN home visit #2  Start Time:   1000 End Time:   1100 Total Minutes:   60  Readiness To Change Score:     Universal RN Interventions: Calendar Distribution: Yes Exercise Review: Yes Medications: Yes Medication Changes: Yes Mood: Yes Pain: Yes PCP Advocacy/Support: No Fall Prevention: Yes Incontinence: Yes Clinician View Of Client Situation: Ambulating well. Very talkative.Home neat and clean. Client View Of His/Her Situation: Reports taking the shingle shot.  Reports that he has not been self monitoring except once a week.  Reports CBG range of 120-220.  reports taking medications as prescribed.  Healthcare Provider Communication: Did Higher education careers adviser With [employer] Provider?: No According to Client, Did PCP Report Communication With An Aging Gracefully RN?: No  Clinician View of Client Situation: Clinician View Of Client Situation: Ambulating well. Very talkative.Home neat and clean. Client's View of His/Her Situation: Client View Of His/Her Situation: Reports taking the shingle shot.  Reports that he has not been self monitoring except once a week.  Reports CBG range of 120-220.  reports taking medications as prescribed.  Medication Assessment:no changes in medications. Outpatient Encounter Medications as of 01/13/2021  Medication Sig   cyclobenzaprine (FLEXERIL) 10 MG tablet Take 0.5-1 tablets (5-10 mg total) by mouth 3 (three) times daily as needed for muscle spasms. (Patient not taking: Reported on 12/16/2020)   dextromethorphan 15 MG/5ML syrup Take 10 mLs (30 mg total) by mouth 4 (four) times daily as needed for cough. (Patient not taking: Reported on 12/16/2020)   glipiZIDE (GLUCOTROL XL) 10 MG 24 hr tablet Take 10 mg by mouth daily. (Patient not taking: Reported on 12/16/2020)   insulin aspart protamine- aspart (NOVOLOG MIX 70/30) (70-30) 100 UNIT/ML injection  Inject 20 Units into the skin daily with breakfast.   lisinopril-hydrochlorothiazide (PRINZIDE,ZESTORETIC) 10-12.5 MG tablet Take 1 tablet by mouth daily.   metFORMIN (GLUCOPHAGE) 1000 MG tablet Take 1,000 mg by mouth 2 (two) times daily with a meal.   Pseudoeph-Doxylamine-DM-APAP (NYQUIL PO) Take 1 Dose by mouth daily as needed (cold symptoms). (Patient not taking: Reported on 12/16/2020)   sitaGLIPtin-metformin (JANUMET) 50-1000 MG tablet Take 1 tablet by mouth 2 (two) times daily with a meal. (Patient not taking: Reported on 12/16/2020)   No facility-administered encounter medications on file as of 01/13/2021.       OT Update: pending home assessment from [facility]  Session Summary: Patient doing well. Interested in home exercises as provided today. Reviewed with patient the importance of DM control. No admissions or falls since last home visit.   Goals Addressed               This Visit's Progress     Patient Stated (pt-stated)        Aging Gracefully RN:  Goal: Patient states he will self monitor CBG ( with wife's assistance) 2 times per day.  Assessment: not self monitoring at all. Assisted patient with checking CBG during home visit . Reading 233.   Interventions: long discussion with patient about how 70/30 insulin works. Reviewed the importance of self monitoring and recording CBG readings to help MD figure out best dose of insulin. Reviewed with patient the importance of good DM control. Reviewed DM diet and avoiding " white things"  Report he has DM educations and needs to read it.  PLAN: next home visit scheduled. Encouraged patient to self monitor.   Gene Fletcher  Randolm Idol, BSN, Careers information officer for [place] Mobile: [phone]   01/13/2021 Assessment:  Patient is not self monitoring.  Reports he is busy.   Interventions:  reviewed importance of self monitoring.  Reviewed and provided home exercise plan. Provided a carb  counting handbook. Reviewed with patient diet suggestions. Encouraged patient to eat foods with less carbs.  Patient is eating lots of fruits.  PLan: Follow up in 1 month  Gene Rand RN, BSN, Careers information officer for [place] Mobile: [phone]         Gene Rand RN, BSN, Careers information officer for [employer] Mobile: [phone]

## 2021-01-27 DIAGNOSIS — H31093 Other chorioretinal scars, bilateral: Secondary | ICD-10-CM | POA: Diagnosis not present

## 2021-01-27 DIAGNOSIS — E113512 Type 2 diabetes mellitus with proliferative diabetic retinopathy with macular edema, left eye: Secondary | ICD-10-CM | POA: Diagnosis not present

## 2021-01-27 DIAGNOSIS — E113591 Type 2 diabetes mellitus with proliferative diabetic retinopathy without macular edema, right eye: Secondary | ICD-10-CM | POA: Diagnosis not present

## 2021-01-27 DIAGNOSIS — H2513 Age-related nuclear cataract, bilateral: Secondary | ICD-10-CM | POA: Diagnosis not present

## 2021-03-01 ENCOUNTER — Other Ambulatory Visit: Payer: Self-pay

## 2021-03-01 NOTE — Patient Outreach (Signed)
Aging Gracefully Program  RN Visit  03/01/2021  Gene Fletcher 07-31-1950 [id number]  Visit:   RN home visit #3  Start Time:   1200 End Time:   51 Total Minutes:   40 Readiness To Change Score:     Universal RN Interventions: Calendar Distribution: Yes Exercise Review: Yes Medications: Yes Medication Changes: Yes Mood: Yes Pain: Yes PCP Advocacy/Support: No Fall Prevention: Yes Incontinence: Yes Clinician View Of Client Situation: HOme neat and clean.  Ambulating without problems. , Client View Of His/Her Situation: Just returmed from [place].  Walking more and doing home exercises. Feels stronger. Reports CBG range 130-160.  Reports only taking insulin once a day.   Wants to lose weight.  Going to eye doctor this week.  Healthcare Provider Communication: Did Higher education careers adviser With [employer] Provider?: No According to Client, Did PCP Report Communication With An Aging Gracefully RN?: No  Clinician View of Client Situation: Clinician View Of Client Situation: HOme neat and clean.  Ambulating without problems. , Client's View of His/Her Situation: Client View Of His/Her Situation: Just returmed from [place].  Walking more and doing home exercises. Feels stronger. Reports CBG range 130-160.  Reports only taking insulin once a day.   Wants to lose weight.  Going to eye doctor this week.  Medication Assessment:no changes. Taking insulin once a day    OT Update: pending home assessment  Session Summary: Doing well. No new problems or concerns. No falls no hospitalizations.    Goals Addressed               This Visit's Progress     Patient Stated (pt-stated)        Aging Gracefully RN:  Goal: Patient states he will self monitor CBG ( with wife's assistance) 2 times per day.  Assessment: not self monitoring at all. Assisted patient with checking CBG during home visit . Reading 233.   Interventions: long discussion with patient about how 70/30 insulin  works. Reviewed the importance of self monitoring and recording CBG readings to help MD figure out best dose of insulin. Reviewed with patient the importance of good DM control. Reviewed DM diet and avoiding " white things"  Report he has DM educations and needs to read it.  PLAN: next home visit scheduled. Encouraged patient to self monitor.   [name] RN, BSN, CEN RN Case Freight forwarder for [employer] Mobile: [phone]   01/13/2021 Assessment:  Patient is not self monitoring.  Reports he is busy.   Interventions:  reviewed importance of self monitoring.  Reviewed and provided home exercise plan. Provided a carb counting handbook. Reviewed with patient diet suggestions. Encouraged patient to eat foods with less carbs.  Patient is eating lots of fruits.  PLan: Follow up in 1 month  [name] RN, BSN, CEN RN Case Freight forwarder for [place] Mobile: [phone]   03/01/2021  Assessment:  reports that he is doing well. Reports that he cut back on some of his fruits. Reports that he is feeling good. Realized he is out of shape.   Reports he is walking more and doing his home exercises.  Interventions:   Encouraged patient to keep on exercising. Reviewed importance of continuing to take medications as prescribed.   Plan: follow up in 1 month  [name] RN, BSN, Careers information officer for [place] Mobile: [place] RN, BSN, [employer]  RN Case Freight forwarder for [employer] Mobile: [phone]

## 2021-03-17 DIAGNOSIS — Z794 Long term (current) use of insulin: Secondary | ICD-10-CM | POA: Diagnosis not present

## 2021-03-17 DIAGNOSIS — I129 Hypertensive chronic kidney disease with stage 1 through stage 4 chronic kidney disease, or unspecified chronic kidney disease: Secondary | ICD-10-CM | POA: Diagnosis not present

## 2021-03-17 DIAGNOSIS — E785 Hyperlipidemia, unspecified: Secondary | ICD-10-CM | POA: Diagnosis not present

## 2021-03-17 DIAGNOSIS — E1121 Type 2 diabetes mellitus with diabetic nephropathy: Secondary | ICD-10-CM | POA: Diagnosis not present

## 2021-03-17 DIAGNOSIS — N1831 Chronic kidney disease, stage 3a: Secondary | ICD-10-CM | POA: Diagnosis not present

## 2021-03-19 DIAGNOSIS — E119 Type 2 diabetes mellitus without complications: Secondary | ICD-10-CM | POA: Diagnosis not present

## 2021-04-16 ENCOUNTER — Other Ambulatory Visit: Payer: Self-pay | Admitting: Occupational Therapy

## 2021-04-17 ENCOUNTER — Other Ambulatory Visit: Payer: Self-pay

## 2021-04-17 NOTE — Patient Instructions (Signed)
Goals Addressed             This Visit's Progress    COMPLETED: Patient Stated       To feel more safe when showering (walk in shower, hand held shower, grab bars, shower seat)  ACTION PLANNING - BATHING  Target Problem Area: Decreased safety with showering  Why Problem May Occur: Decreased balance, decreased safety with stepping over tub wall   Target Goal: Safety and independence with showering   STRATEGIES Saving Your Energy: DO: DON'T:  Use a shower seat Minimize standing while bathing, it uses more energy  Keep all items you'll need within easy reach [place]  Modifying your home environment and making it safe: DO: DON'T:  Use grab bars in the shower    Place a rubber mat the size you need in the shower or use non skid treds. Place loose rugs in the bathroom- they can trip you or your walker/cane can get caught on them  Make sure the bathroom is well lit    Use handheld shower head   Simplifying the way you set up tasks or daily routines: DO: DON'T:  Plan to shower before you're overly tired [place] through [employer] all items before getting started    Practice It is important to practice the strategies so we can determine if they will be effective in helping to reach your goal. Follow these specific recommendations: 1.Use your new shower seat. 2.Use a non-skid mat.  If a strategy does not work the first time, try it again and again (and maybe again). Golden Circle, OTR/L       04/16/2021

## 2021-04-17 NOTE — Patient Outreach (Signed)
Aging Gracefully Program  OT Follow-Up Visit  04/17/2021  Gene Fletcher October 03, 1950 [id number]  Visit:  2- Second Visit  Start Time:  5366 End Time:  4403 Total Minutes:  30  Readiness to Change Score :  Readiness to Change Score: 10  Durable Medical Equipment: Durable Medical Equipment: Shower Chair With Back Durable Medical Equipment Distribution Date: 04/16/21   Goals:   Goals Addressed             This Visit's Progress    COMPLETED: Patient Stated       To feel more safe when showering (walk in shower, hand held shower, grab bars, shower seat)  ACTION PLANNING - BATHING  Target Problem Area: Decreased safety with showering  Why Problem May Occur: Decreased balance, decreased safety with stepping over tub wall   Target Goal: Safety and independence with showering   STRATEGIES Saving Your Energy: DO: DON'T:  Use a shower seat Minimize standing while bathing, it uses more energy  Keep all items you'll need within easy reach [place]  Modifying your home environment and making it safe: DO: DON'T:  Use grab bars in the shower    Place a rubber mat the size you need in the shower or use non skid treds. Place loose rugs in the bathroom- they can trip you or your walker/cane can get caught on them  Make sure the bathroom is well lit    Use handheld shower head   Simplifying the way you set up tasks or daily routines: DO: DON'T:  Plan to shower before you're overly tired [place] through [employer] all items before getting started    Practice It is important to practice the strategies so we can determine if they will be effective in helping to reach your goal. Follow these specific recommendations: 1.Use your new shower seat. 2.Use a non-skid mat.  If a strategy does not work the first time, try it again and again (and maybe again). Golden Circle, OTR/L       04/16/2021        Post Clinical Reasoning: Client Action (Goal) One Interventions:  Client is really happy with new tub cut out and now seat that has been provided. Did Client Try?: Yes Targeted Problem Area Status: A Lot Better Clinician View Of Client Situation:: [name] is doing well. He and [name] are happy with their new tub cut out and now shower seat provided. I went over placement of the shower seat and were they could put handheld shower head so they could both reach it easier when sitting on the shower seat. Client View Of His/Her Situation:: [name] continues to do well and is so happy with all the modifications that have been completed at their home. Next Visit Plan:: Getting up from a fall and "Tips for Aging at Home" book.  Golden Circle, OTR/L Aging Gracefully [phone]

## 2021-05-03 ENCOUNTER — Other Ambulatory Visit: Payer: Self-pay

## 2021-05-03 NOTE — Patient Instructions (Signed)
Goals Addressed               This Visit's Progress     COMPLETED: Patient Stated (pt-stated)        Aging Gracefully RN:  Goal: Patient states he will self monitor CBG ( with wife's assistance) 2 times per day.  Assessment: not self monitoring at all. Assisted patient with checking CBG during home visit . Reading 233.   Interventions: long discussion with patient about how 70/30 insulin works. Reviewed the importance of self monitoring and recording CBG readings to help MD figure out best dose of insulin. Reviewed with patient the importance of good DM control. Reviewed DM diet and avoiding " white things"  Report he has DM educations and needs to read it.  PLAN: next home visit scheduled. Encouraged patient to self monitor.   [name] RN, BSN, CEN RN Case Freight forwarder for [facility] [employer] Mobile: [phone]   01/13/2021 Assessment:  Patient is not self monitoring.  Reports he is busy.   Interventions:  reviewed importance of self monitoring.  Reviewed and provided home exercise plan. Provided a carb counting handbook. Reviewed with patient diet suggestions. Encouraged patient to eat foods with less carbs.  Patient is eating lots of fruits.  PLan: Follow up in 1 month  [name] RN, BSN, CEN RN Case Freight forwarder for [place] Mobile: [phone]   03/01/2021  Assessment:  reports that he is doing well. Reports that he cut back on some of his fruits. Reports that he is feeling good. Realized he is out of shape.   Reports he is walking more and doing his home exercises.  Interventions:   Encouraged patient to keep on exercising. Reviewed importance of continuing to take medications as prescribed.   Plan: follow up in 1 month  [name] RN, BSN, CEN RN Case Freight forwarder for [facility] Mobile: [phone]   05/03/2021  Assessment: Patient continues to struggle with CBG monitoring. He has  a fear of needles and CBG and insulin shots bother him.   He  has not self monitored in 2 weeks.  He reports that he took his insulin twice a day as RX for a few days and felt weak. So he has gone back to once  a day. He is interested in trying to go off his insulin as it is very bothersome to him. Interventions: reviewed DM diet and importance of exercise. Encouraged patient to talk to his MD at his scheduled check up on 05/31/2021  at 2pm.  Plan: Patient has completed the Aging Gracefully RN part of the program./ goal not met due to dis interest in CBG monitoring.  [name] RN, BSN, Careers information officer for [employer] Mobile: [phone]

## 2021-05-03 NOTE — Patient Outreach (Signed)
Aging Gracefully Program  RN Visit  05/03/2021  Gene Fletcher 1950-12-22 [id number]  Visit:   RN home visit #4  Start Time:   1300 End Time:   1500 Total Minutes:   120  Readiness To Change Score:     Universal RN Interventions: Calendar Distribution: Yes Exercise Review: Yes Medications: Yes Medication Changes: Yes Mood: Yes Pain: Yes PCP Advocacy/Support: No Fall Prevention: Yes Incontinence: Yes Clinician View Of Client Situation: Home neat and clean.  Walking without difficulty. Client View Of His/Her Situation: Patient reports that he is doing well.  Reports that he is self monitoring 1 time every 2 weeks.   Taking insulin once  day.   Reports h is taking all his medications as prescribed.  Reports he does not want to check his CBG during this home visit.  no pain. no falls.  Healthcare Provider Communication: Did Higher education careers adviser With [employer] Provider?: No According to Client, Did PCP Report Communication With An Aging Gracefully RN?: No  Clinician View of Client Situation: Clinician View Of Client Situation: Home neat and clean.  Walking without difficulty. Client's View of His/Her Situation: Client View Of His/Her Situation: Patient reports that he is doing well.  Reports that he is self monitoring 1 time every 2 weeks.   Taking insulin once  day.   Reports h is taking all his medications as prescribed.  Reports he does not want to check his CBG during this home visit.  no pain. no falls.  Medication Assessment:denies any changes.     OT Update: home modifications completed. RN home visit completed.   Session Summary: Patient doing well. Reports that he is doing better with his diet. Reports knowledge of need for exercise. Encouraged patient to talk to MD at next office visit.    Goals Addressed               This Visit's Progress     COMPLETED: Patient Stated (pt-stated)        Aging Gracefully RN:  Goal: Patient states he will self monitor  CBG ( with wife's assistance) 2 times per day.  Assessment: not self monitoring at all. Assisted patient with checking CBG during home visit . Reading 233.   Interventions: long discussion with patient about how 70/30 insulin works. Reviewed the importance of self monitoring and recording CBG readings to help MD figure out best dose of insulin. Reviewed with patient the importance of good DM control. Reviewed DM diet and avoiding " white things"  Report he has DM educations and needs to read it.  PLAN: next home visit scheduled. Encouraged patient to self monitor.   [name] RN, BSN, CEN RN Case Freight forwarder for [facility] [employer] Mobile: [phone]   01/13/2021 Assessment:  Patient is not self monitoring.  Reports he is busy.   Interventions:  reviewed importance of self monitoring.  Reviewed and provided home exercise plan. Provided a carb counting handbook. Reviewed with patient diet suggestions. Encouraged patient to eat foods with less carbs.  Patient is eating lots of fruits.  PLan: Follow up in 1 month  [name] RN, BSN, CEN RN Case Freight forwarder for [place] Mobile: [phone]   03/01/2021  Assessment:  reports that he is doing well. Reports that he cut back on some of his fruits. Reports that he is feeling good. Realized he is out of shape.   Reports he is walking more and doing his home exercises.  Interventions:  Encouraged patient to keep on exercising. Reviewed importance of continuing to take medications as prescribed.   Plan: follow up in 1 month  [name] RN, BSN, CEN RN Case Freight forwarder for [place] Mobile: [phone]   05/03/2021  Assessment: Patient continues to struggle with CBG monitoring. He has a fear of needles and CBG and insulin shots bother him.   He  has not self monitored in 2 weeks.  He reports that he took his insulin twice a day as RX for a few days and felt  weak. So he has gone back to once  a day. He is interested in trying to go off his insulin as it is very bothersome to him. Interventions: reviewed DM diet and importance of exercise. Encouraged patient to talk to his MD at his scheduled check up on 05/31/2021  at 2pm.  Plan: Patient has completed the Aging Gracefully RN part of the program./ goal not met due to dis interest in CBG monitoring.  [name] RN, BSN, Careers information officer for [employer] Mobile: [phone]

## 2021-05-05 DIAGNOSIS — H52223 Regular astigmatism, bilateral: Secondary | ICD-10-CM | POA: Diagnosis not present

## 2021-05-05 DIAGNOSIS — H524 Presbyopia: Secondary | ICD-10-CM | POA: Diagnosis not present

## 2021-05-24 DIAGNOSIS — I10 Essential (primary) hypertension: Secondary | ICD-10-CM | POA: Diagnosis not present

## 2021-05-24 DIAGNOSIS — E1121 Type 2 diabetes mellitus with diabetic nephropathy: Secondary | ICD-10-CM | POA: Diagnosis not present

## 2021-05-24 DIAGNOSIS — Z125 Encounter for screening for malignant neoplasm of prostate: Secondary | ICD-10-CM | POA: Diagnosis not present

## 2021-05-24 DIAGNOSIS — E785 Hyperlipidemia, unspecified: Secondary | ICD-10-CM | POA: Diagnosis not present

## 2021-05-31 DIAGNOSIS — E1121 Type 2 diabetes mellitus with diabetic nephropathy: Secondary | ICD-10-CM | POA: Diagnosis not present

## 2021-05-31 DIAGNOSIS — D649 Anemia, unspecified: Secondary | ICD-10-CM | POA: Diagnosis not present

## 2021-05-31 DIAGNOSIS — Z794 Long term (current) use of insulin: Secondary | ICD-10-CM | POA: Diagnosis not present

## 2021-05-31 DIAGNOSIS — Z1339 Encounter for screening examination for other mental health and behavioral disorders: Secondary | ICD-10-CM | POA: Diagnosis not present

## 2021-05-31 DIAGNOSIS — I129 Hypertensive chronic kidney disease with stage 1 through stage 4 chronic kidney disease, or unspecified chronic kidney disease: Secondary | ICD-10-CM | POA: Diagnosis not present

## 2021-05-31 DIAGNOSIS — E785 Hyperlipidemia, unspecified: Secondary | ICD-10-CM | POA: Diagnosis not present

## 2021-05-31 DIAGNOSIS — Z1331 Encounter for screening for depression: Secondary | ICD-10-CM | POA: Diagnosis not present

## 2021-05-31 DIAGNOSIS — N1832 Chronic kidney disease, stage 3b: Secondary | ICD-10-CM | POA: Diagnosis not present

## 2021-05-31 DIAGNOSIS — Z0000 Encounter for general adult medical examination without abnormal findings: Secondary | ICD-10-CM | POA: Diagnosis not present

## 2021-06-03 ENCOUNTER — Other Ambulatory Visit: Payer: Self-pay | Admitting: Occupational Therapy

## 2021-06-03 ENCOUNTER — Other Ambulatory Visit: Payer: Self-pay

## 2021-06-03 NOTE — Patient Instructions (Signed)
Handouts on getting up from a fall and Check for Safety at home provided and gone over. ?

## 2021-06-03 NOTE — Patient Outreach (Signed)
Aging Gracefully Program ? ?OT Follow-Up Visit ? ?06/03/2021 ? ?Karn Cassis ?July 07, 1950 ?[id number] ? ?Visit:  3- Third Visit ? ?Start Time:  1000 ?End Time:  1030 ?Total Minutes:  30 ? ? ?Readiness to Change Score :  Readiness to Change Score: 10 ? ?Patient Education: ?Education Provided: Yes ?Education Details: How to get up from a fall and Check for Safety handouts provided ?Person(s) Educated: Patient ?Comprehension: Returned Demonstration, Verbalized Understanding (return demo for getting up from fall, verbalized understanding for check for safety handout) ? ?Goals: This goal was completed last visit--forgot to mark it as such. ? Goals Addressed   ? ?  ?  ?  ?  ? This Visit's Progress  ?  COMPLETED: Patient Stated     ?  Feel more safe getting in and out their back door. (Fix back door lock so they can get out, small threshold ramp from porch into house so a wheelchair could get in, ramp at back door v. Fixing steps only) ?  ? ?  ? ? ?Post Clinical Reasoning: ?Clinician View Of Client Situation:: Mr. Butt continues to do well. He and wife remain active with getting out and about. ?Client View Of His/Her Situation:: Mr. Chauca watches his health well as well as his wife's (she has mild memory loss and just started taking medication for this). He knows they should both get involved in [employer] as part of their [employer] benefits. ?Next Visit Plan:: Tips for aging at home booklet and non-skid grips for shower (client to provide). ? ?Golden Circle, OTR/L ?Aging Gracefully ?[phone] ? ? ?

## 2021-06-17 DIAGNOSIS — E785 Hyperlipidemia, unspecified: Secondary | ICD-10-CM | POA: Diagnosis not present

## 2021-06-17 DIAGNOSIS — I10 Essential (primary) hypertension: Secondary | ICD-10-CM | POA: Diagnosis not present

## 2021-06-17 DIAGNOSIS — Z8249 Family history of ischemic heart disease and other diseases of the circulatory system: Secondary | ICD-10-CM | POA: Diagnosis not present

## 2021-06-17 DIAGNOSIS — Z794 Long term (current) use of insulin: Secondary | ICD-10-CM | POA: Diagnosis not present

## 2021-06-17 DIAGNOSIS — Z833 Family history of diabetes mellitus: Secondary | ICD-10-CM | POA: Diagnosis not present

## 2021-06-17 DIAGNOSIS — E113599 Type 2 diabetes mellitus with proliferative diabetic retinopathy without macular edema, unspecified eye: Secondary | ICD-10-CM | POA: Diagnosis not present

## 2021-06-17 DIAGNOSIS — Z7984 Long term (current) use of oral hypoglycemic drugs: Secondary | ICD-10-CM | POA: Diagnosis not present

## 2021-07-26 ENCOUNTER — Other Ambulatory Visit: Payer: Self-pay | Admitting: Occupational Therapy

## 2021-07-26 NOTE — Patient Instructions (Signed)
Gutters--potentially installing gutter guards so he does not have to get up on ladder to clean gutters. ? ?Mr. Kurtzman reported that [facility] cleaned out his gutters but do not provide gutter guards. Mr. Phineas Douglas states he has someone else that can do it from now on. ?

## 2021-07-26 NOTE — Patient Outreach (Signed)
Aging Gracefully Program ? ?OT FINAL Visit ? ?07/26/2021 ? ?Karn Cassis ?1950/11/15 ?[id number] ? ?Visit:  4- Fourth Visit ? ?Start Time:  1600 ?End Time:  1630 ?Total Minutes:  30 ? ?Readiness to Change:  Readiness to Change Score: 10 ? ?Patient Education: ?Education Provided: Yes ?Education Details: Tips for Aging at Home booklet provided ?Person(s) Educated: Patient, Spouse ?Comprehension: Verbalized Understanding ? ?Goals: ? Goals Addressed   ? ?  ?  ?  ?  ? This Visit's Progress  ?  COMPLETED: Patient Stated     ?  Gutters--potentially installing gutter guards so he does not have to get up on ladder to clean gutters. ? ?Mr. Rathbun reported that [facility] cleaned [facility] but do not provide gutter guards. Mr. Phineas Douglas states he has someone else that can do it from now on. ?  ? ?  ? ? ?Post Clinical Reasoning: ?Client Action (Goal) Four Interventions: Gutters were cleaned out by [facility] so client would not have to climb up on a ladder to clean them out and potentially fall. ?Targeted Problem Area Status: A Lot Better ?Clinician View Of Client Situation:: All is well with Mr. Lazar. He lets his wife do as much for herself as possible and only directs her when needed--she has mild dementia. He makes sure she gets her health needs taken care of. He and wife try to stay busy. ?Client View Of His/Her Situation:: Mr. Postlewait continues to take care of himself and his wife as needed. He does a great job of this. He is pleased will all the work [employer], RN, and OT has done for he and his wife. ?Next Visit Plan:: This was my last visit with the [facility]. It has been a pleasure working with them. ? ?Golden Circle, OTR/L ?Aging Gracefully ?[phone] ?

## 2021-08-06 ENCOUNTER — Other Ambulatory Visit: Payer: Self-pay

## 2021-08-06 DIAGNOSIS — E113591 Type 2 diabetes mellitus with proliferative diabetic retinopathy without macular edema, right eye: Secondary | ICD-10-CM | POA: Diagnosis not present

## 2021-08-06 DIAGNOSIS — H3582 Retinal ischemia: Secondary | ICD-10-CM | POA: Diagnosis not present

## 2021-08-06 DIAGNOSIS — H2513 Age-related nuclear cataract, bilateral: Secondary | ICD-10-CM | POA: Diagnosis not present

## 2021-08-06 DIAGNOSIS — E113512 Type 2 diabetes mellitus with proliferative diabetic retinopathy with macular edema, left eye: Secondary | ICD-10-CM | POA: Diagnosis not present

## 2021-08-06 NOTE — Patient Outreach (Signed)
Aging Gracefully Program  08/06/2021  Gene Fletcher 1950-12-14 [id number]   [facility] Evaluation Interviewer made contact with patient. Aging Gracefully 5 month survey completed.   Interviewer will send referral to Sharol Given at [facility] for follow up.  [place] Management Assistant  [phone]

## 2021-08-06 NOTE — Patient Outreach (Signed)
[facility]) Care Management  08/06/2021  Gene Fletcher 03-08-1951 [id number]   [facility] Evaluation Interviewer attempted to call patient on today regarding Aging Gracefully referral. No answer from patient after multiple rings. CMA left confidential voicemail for patient to return call.  Will attempt to call back within 1 week.   [place] Management Assistant [phone]

## 2021-08-07 ENCOUNTER — Ambulatory Visit
Admission: EM | Admit: 2021-08-07 | Discharge: 2021-08-07 | Disposition: A | Discharge status: Home / Self Care | Payer: Medicare HMO

## 2021-08-07 DIAGNOSIS — R519 Headache, unspecified: Secondary | ICD-10-CM

## 2021-08-07 NOTE — Discharge Instructions (Addendum)
Please follow-up at ER if symptoms persist or worsen.
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
## 2021-08-09 ENCOUNTER — Emergency Department (HOSPITAL_BASED_OUTPATIENT_CLINIC_OR_DEPARTMENT_OTHER): Payer: No Typology Code available for payment source

## 2021-08-09 ENCOUNTER — Emergency Department (HOSPITAL_BASED_OUTPATIENT_CLINIC_OR_DEPARTMENT_OTHER)
Admission: EM | Admit: 2021-08-09 | Discharge: 2021-08-09 | Disposition: A | Discharge status: Home / Self Care | Payer: No Typology Code available for payment source | Attending: Emergency Medicine | Admitting: Emergency Medicine

## 2021-08-09 ENCOUNTER — Encounter (HOSPITAL_BASED_OUTPATIENT_CLINIC_OR_DEPARTMENT_OTHER): Payer: Self-pay

## 2021-08-09 ENCOUNTER — Other Ambulatory Visit: Payer: Self-pay

## 2021-08-09 DIAGNOSIS — Z79899 Other long term (current) drug therapy: Secondary | ICD-10-CM | POA: Insufficient documentation

## 2021-08-09 DIAGNOSIS — R519 Headache, unspecified: Secondary | ICD-10-CM | POA: Diagnosis not present

## 2021-08-09 DIAGNOSIS — I10 Essential (primary) hypertension: Secondary | ICD-10-CM | POA: Diagnosis not present

## 2021-08-09 DIAGNOSIS — M25511 Pain in right shoulder: Secondary | ICD-10-CM | POA: Diagnosis not present

## 2021-08-09 DIAGNOSIS — R030 Elevated blood-pressure reading, without diagnosis of hypertension: Secondary | ICD-10-CM

## 2021-08-09 DIAGNOSIS — Z794 Long term (current) use of insulin: Secondary | ICD-10-CM | POA: Diagnosis not present
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
## 2021-08-09 NOTE — Discharge Instructions (Signed)
Your CT scan was reassuring at this time. Your blood pressure was noted to be elevated today. It is recommended that you follow up with your PCP first thing tomorrow morning. I would recommend checking your blood pressure again tomorrow after taking your lisinopril-hctz. If it is significantly elevated again you should see your PCP immediately or return to the ED.   Return to the ED for any new/worsening symptoms including worsening headache, vision changes, chest pain, shortness of breath, difficulty urinating, or any other new/concerning symptoms.
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
## 2021-09-28 DIAGNOSIS — Z794 Long term (current) use of insulin: Secondary | ICD-10-CM | POA: Diagnosis not present

## 2021-09-28 DIAGNOSIS — E785 Hyperlipidemia, unspecified: Secondary | ICD-10-CM | POA: Diagnosis not present

## 2021-09-28 DIAGNOSIS — E1121 Type 2 diabetes mellitus with diabetic nephropathy: Secondary | ICD-10-CM | POA: Diagnosis not present

## 2021-09-28 DIAGNOSIS — I129 Hypertensive chronic kidney disease with stage 1 through stage 4 chronic kidney disease, or unspecified chronic kidney disease: Secondary | ICD-10-CM | POA: Diagnosis not present

## 2021-09-28 DIAGNOSIS — N1831 Chronic kidney disease, stage 3a: Secondary | ICD-10-CM | POA: Diagnosis not present

## 2021-12-06 DIAGNOSIS — Z794 Long term (current) use of insulin: Secondary | ICD-10-CM | POA: Diagnosis not present

## 2021-12-06 DIAGNOSIS — E1121 Type 2 diabetes mellitus with diabetic nephropathy: Secondary | ICD-10-CM | POA: Diagnosis not present

## 2021-12-06 DIAGNOSIS — I129 Hypertensive chronic kidney disease with stage 1 through stage 4 chronic kidney disease, or unspecified chronic kidney disease: Secondary | ICD-10-CM | POA: Diagnosis not present

## 2021-12-06 DIAGNOSIS — N1831 Chronic kidney disease, stage 3a: Secondary | ICD-10-CM | POA: Diagnosis not present

## 2021-12-06 DIAGNOSIS — E785 Hyperlipidemia, unspecified: Secondary | ICD-10-CM | POA: Diagnosis not present

## 2021-12-16 ENCOUNTER — Other Ambulatory Visit: Payer: Self-pay

## 2021-12-16 NOTE — Patient Outreach (Signed)
Aging Gracefully Program  12/16/2021  PADEN SENGER 1950-10-12 [id number]   [facility] Evaluation Interviewer attempted to call patient on today regarding Aging Gracefully referral. No answer from patient after multiple rings. CMA left confidential voicemail for patient to return call.  Will attempt to call back within 1 week.   [place] Management Assistant [phone]

## 2021-12-16 NOTE — Patient Outreach (Signed)
Aging Gracefully Program  12/16/2021  Gene Fletcher [birth year] [id number]   [facility] Evaluation Interviewer made contact with patient. Aging Gracefully 9 months survey completed.    [place] Management Assistant [phone]

## 2022-03-08 DIAGNOSIS — E785 Hyperlipidemia, unspecified: Secondary | ICD-10-CM | POA: Diagnosis not present

## 2022-03-08 DIAGNOSIS — I129 Hypertensive chronic kidney disease with stage 1 through stage 4 chronic kidney disease, or unspecified chronic kidney disease: Secondary | ICD-10-CM | POA: Diagnosis not present

## 2022-03-08 DIAGNOSIS — N1831 Chronic kidney disease, stage 3a: Secondary | ICD-10-CM | POA: Diagnosis not present

## 2022-03-08 DIAGNOSIS — Z794 Long term (current) use of insulin: Secondary | ICD-10-CM | POA: Diagnosis not present

## 2022-03-08 DIAGNOSIS — E1121 Type 2 diabetes mellitus with diabetic nephropathy: Secondary | ICD-10-CM | POA: Diagnosis not present

## 2022-03-19 ENCOUNTER — Emergency Department: Payer: Medicare HMO

## 2022-03-19 ENCOUNTER — Other Ambulatory Visit: Payer: Self-pay

## 2022-03-19 ENCOUNTER — Emergency Department
Admission: EM | Admit: 2022-03-19 | Discharge: 2022-03-19 | Disposition: A | Discharge status: Home / Self Care | Payer: Medicare HMO | Attending: Emergency Medicine | Admitting: Emergency Medicine

## 2022-03-19 DIAGNOSIS — R9431 Abnormal electrocardiogram [ECG] [EKG]: Secondary | ICD-10-CM | POA: Diagnosis not present

## 2022-03-19 DIAGNOSIS — E860 Dehydration: Secondary | ICD-10-CM | POA: Insufficient documentation

## 2022-03-19 DIAGNOSIS — R55 Syncope and collapse: Secondary | ICD-10-CM | POA: Diagnosis not present

## 2022-03-19 DIAGNOSIS — N179 Acute kidney failure, unspecified: Secondary | ICD-10-CM | POA: Diagnosis not present

## 2022-03-19 DIAGNOSIS — I700 Atherosclerosis of aorta: Secondary | ICD-10-CM | POA: Diagnosis not present

## 2022-03-19 DIAGNOSIS — E1122 Type 2 diabetes mellitus with diabetic chronic kidney disease: Secondary | ICD-10-CM | POA: Insufficient documentation

## 2022-03-19 DIAGNOSIS — N184 Chronic kidney disease, stage 4 (severe): Secondary | ICD-10-CM | POA: Diagnosis not present

## 2022-03-19 DIAGNOSIS — N189 Chronic kidney disease, unspecified: Secondary | ICD-10-CM

## 2022-03-19 DIAGNOSIS — K4090 Unilateral inguinal hernia, without obstruction or gangrene, not specified as recurrent: Secondary | ICD-10-CM | POA: Insufficient documentation

## 2022-03-19 DIAGNOSIS — H0520 Unspecified exophthalmos: Secondary | ICD-10-CM | POA: Insufficient documentation

## 2022-03-19 LAB — URINALYSIS, ROUTINE W REFLEX MICROSCOPIC
Bacteria, UA: NONE SEEN
Bilirubin Urine: NEGATIVE
Glucose, UA: 50 mg/dL — AB
Hgb urine dipstick: NEGATIVE
Ketones, ur: NEGATIVE mg/dL
Leukocytes,Ua: NEGATIVE
Nitrite: NEGATIVE
Protein, ur: >=300 mg/dL — AB
Specific Gravity, Urine: 1.015 (ref 1.005–1.030)
Squamous Epithelial / LPF: NONE SEEN /HPF (ref 0–5)
pH: 5 (ref 5.0–8.0)

## 2022-03-19 LAB — CBC
HCT: 32.8 % — ABNORMAL LOW (ref 39.0–52.0)
Hemoglobin: 10.8 g/dL — ABNORMAL LOW (ref 13.0–17.0)
MCH: 31.6 pg (ref 26.0–34.0)
MCHC: 32.9 g/dL (ref 30.0–36.0)
MCV: 95.9 fL (ref 80.0–100.0)
Platelets: 239 10*3/uL (ref 150–400)
RBC: 3.42 MIL/uL — ABNORMAL LOW (ref 4.22–5.81)
RDW: 11.9 % (ref 11.5–15.5)
WBC: 5.6 10*3/uL (ref 4.0–10.5)
nRBC: 0 % (ref 0.0–0.2)

## 2022-03-19 LAB — BASIC METABOLIC PANEL
Anion gap: 13 (ref 5–15)
BUN: 41 mg/dL — ABNORMAL HIGH (ref 8–23)
CO2: 18 mmol/L — ABNORMAL LOW (ref 22–32)
Calcium: 8.3 mg/dL — ABNORMAL LOW (ref 8.9–10.3)
Chloride: 109 mmol/L (ref 98–111)
Creatinine, Ser: 2.56 mg/dL — ABNORMAL HIGH (ref 0.61–1.24)
GFR, Estimated: 26 mL/min — ABNORMAL LOW (ref >60)
Glucose, Bld: 166 mg/dL — ABNORMAL HIGH (ref 70–99)
Potassium: 4 mmol/L (ref 3.5–5.1)
Sodium: 140 mmol/L (ref 135–145)

## 2022-03-19 LAB — TSH: TSH: 2.204 u[IU]/mL (ref 0.350–4.500)

## 2022-03-19 LAB — T4, FREE: Free T4: 0.88 ng/dL (ref 0.61–1.12)

## 2022-03-19 MED ORDER — SODIUM CHLORIDE 0.9 % IV BOLUS
500.0000 mL | Freq: Once | INTRAVENOUS | Status: AC
  Administered 2022-03-19: 500 mL via INTRAVENOUS

## 2022-03-19 NOTE — ED Notes (Signed)
Gene Mussel RN informed pt in room.

## 2022-03-19 NOTE — ED Provider Notes (Signed)
[facility] Provider Note    Event Date/Time   First MD Initiated Contact with Patient 03/19/22 2044     (approximate)   History   Loss of Consciousness   HPI  Gene Fletcher is a 71 y.o. male has a history of diabetes and chronic kidney disease most of the summer but with stage I-IV based on primary care notes, unfortunately are not able to access his baseline labs from his primary provider, just documentation that he has known chronic kidney disease  Patient reports has been in his normal state of health been doing well.  He was having dinner with friends he reports that he had something to eat and then had a [place] iced tea, he got tired after that and started to doze off at the table, and then wife reports he passed out for a few seconds.  They called EMS.  He reports he came to by the time the paramedics had arrived.  Patient thinks he might of become a little bit dehydrated  No chest pain no trouble breathing.  He reports he feels perfectly fine now no abdominal pain nothing bothering him.  He feels absolutely fine at this point but he does report right after or around the time he passed out he vomited briefly.  Advises that he does not want to stay much longer feels like he needs to get home soon as he feels much better and is very clear that he does not wish to be hospitalized  Patient reports that he does not have any trouble with his bladder no difficulty urinating or voiding.  He does not know if he has a history of kidney problems, but he does have diabetes. (His medical record indicates a history of chronic kidney disease to some extent, but to the exact degree is not known)      Physical Exam   Triage Vital Signs: ED Triage Vitals  Enc Vitals Group     BP 03/19/22 1746 (!) 155/71     Pulse Rate 03/19/22 1746 70     Resp 03/19/22 1746 18     Temp 03/19/22 1746 97.8 F (36.6 C)     Temp Source 03/19/22 1746 Oral     SpO2 03/19/22  1746 100 %     Weight 03/19/22 1747 209 lb (94.8 kg)     Height 03/19/22 1747 '5\' 11"'$  (1.803 m)     Head Circumference --      Peak Flow --      Pain Score 03/19/22 1746 0     Pain Loc --      Pain Edu? --      Excl. in [place]? --     Most recent vital signs: Vitals:   03/19/22 2200 03/19/22 2202  BP: (!) 154/76   Pulse: 82   Resp: 18   Temp:  97.9 F (36.6 C)  SpO2: 100%      General: Awake, no distress.  Very pleasant.  Mucous membranes moist.  In no distress fully alert and oriented CV:  Good peripheral perfusion.  Normal heart tones and rate Resp:  Normal effort.  Clear bilaterally with normal effort Abd:  No distention.  Soft nontender nondistended throughout Other:  Lower extremity edema   ED Results / Procedures / Treatments   Labs (all labs ordered are listed, but only abnormal results are displayed) Labs Reviewed  BASIC METABOLIC PANEL - Abnormal; Notable for the following components:  Result Value   CO2 18 (*)    Glucose, Bld 166 (*)    BUN 41 (*)    Creatinine, Ser 2.56 (*)    Calcium 8.3 (*)    GFR, Estimated 26 (*)    All other components within normal limits  CBC - Abnormal; Notable for the following components:   RBC 3.42 (*)    Hemoglobin 10.8 (*)    HCT 32.8 (*)    All other components within normal limits  URINALYSIS, ROUTINE W REFLEX MICROSCOPIC - Abnormal; Notable for the following components:   Color, Urine YELLOW (*)    APPearance CLEAR (*)    Glucose, UA 50 (*)    Protein, ur >=300 (*)    All other components within normal limits  TSH  T4, FREE     EKG  And interpreted by me at 2100 heart rate 70 QRS 80 QTc 430 Normal sinus rhythm no evidence of acute ischemia.  Mild nonspecific T wave abnormality.   RADIOLOGY    CT head interpreted by me as negative for acute intracranial pathology.  Radiologist notes evidence of some exophthalmos  CT Renal Stone Study  Result Date: 03/19/2022 CLINICAL DATA:  Assess for renal infarct or  renal disease EXAM: CT ABDOMEN AND PELVIS WITHOUT CONTRAST TECHNIQUE: Multidetector CT imaging of the abdomen and pelvis was performed following the standard protocol without IV contrast. RADIATION DOSE REDUCTION: This exam was performed according to the departmental dose-optimization program which includes automated exposure control, adjustment of the mA and/or kV according to patient size and/or use of iterative reconstruction technique. COMPARISON:  None Available. FINDINGS: Lower chest: Lung bases demonstrate no acute airspace disease. Hepatobiliary: No calcified gallstone. Subcentimeter hypodensity in the posterior right hepatic lobe too small to further characterize. No biliary dilatation Pancreas: Unremarkable. No pancreatic ductal dilatation or surrounding inflammatory changes. Spleen: Normal in size without focal abnormality. Adrenals/Urinary Tract: Adrenal glands are normal. Nonspecific perinephric stranding. No hydronephrosis or ureteral stone. The bladder is normal Stomach/Bowel: Stomach is within normal limits. Appendix appears normal. No evidence of bowel wall thickening, distention, or inflammatory changes. Vascular/Lymphatic: Mild aortic atherosclerosis. No aneurysm. No suspicious lymph nodes. Reproductive: Prostate is unremarkable. Other: Negative for pelvic effusion or free air. Small fat containing right inguinal hernia Musculoskeletal: No acute or suspicious osseous abnormality IMPRESSION: 1. No CT evidence for acute intra-abdominal or pelvic abnormality. Negative for hydronephrosis or ureteral stone. Nonspecific perinephric stranding. 2. Aortic atherosclerosis. Aortic Atherosclerosis (ICD10-I70.0). Electronically Signed   By: Donavan Foil M.D.   On: 03/19/2022 21:26   CT HEAD WO CONTRAST (5MM)  Result Date: 03/19/2022 CLINICAL DATA:  Syncope/presyncope, cerebrovascular cause suspected EXAM: CT HEAD WITHOUT CONTRAST TECHNIQUE: Contiguous axial images were obtained from the base of the skull  through the vertex without intravenous contrast. RADIATION DOSE REDUCTION: This exam was performed according to the departmental dose-optimization program which includes automated exposure control, adjustment of the mA and/or kV according to patient size and/or use of iterative reconstruction technique. COMPARISON:  08/09/2021 FINDINGS: Brain: There is periventricular white matter decreased attenuation consistent with small vessel ischemic changes. Gray-white differentiation is preserved. No acute intracranial hemorrhage, mass effect or shift. No hydrocephalus. Vascular: No hyperdense vessel or unexpected calcification. Skull: Normal. Negative for fracture or focal lesion. Sinuses/Orbits: No acute finding. Bilateral exophthalmos again noted. IMPRESSION: Periventricular white matter changes consistent with chronic small vessel ischemia. Bilateral exophthalmos. No acute intracranial process identified. Electronically Signed   By: Sammie Bench M.D.   On: 03/19/2022 19:13  PROCEDURES:  Critical Care performed: No  Procedures   MEDICATIONS ORDERED IN ED: Medications  sodium chloride 0.9 % bolus 500 mL (500 mLs Intravenous New Bag/Given 03/19/22 2104)     IMPRESSION / MDM / ASSESSMENT AND PLAN / ED COURSE  I reviewed the triage vital signs and the nursing notes.                              Differential diagnosis includes, but is not limited to, dehydration, dysrhythmia, carotid stenosis, dehydration, infection, acute kidney injury, neurologic syncope, stroke etc.  No evidence of acute intracranial abnormalities alert well-oriented reassuring neurologic exam moves all extremities well follows commands clear speech no ataxia eyes normal extraocular movements.  Patient's clinical history seems to suggest element of possible dehydration but his labs indicate notable renal disease.  He does have a documented history of renal disease but his baseline creatinine is not clear, I have paged for the  on-call at [facility] to try to discern additional records as we continue our workup.  Labs notable for creatinine 2.6.  CO2 of 18 BUN of 41.  Hemoglobin is 10.  6 years ago his creatinine was 1.7 which to me suggest that this may be chronic elevation of his creatinine in nature, but is not quite clear  Patient's presentation is most consistent with acute complicated illness / injury requiring diagnostic workup.   The patient is on the cardiac monitor to evaluate for evidence of arrhythmia and/or significant heart rate changes.  ----------------------------------------- 10:10 PM on 03/19/2022 ----------------------------------------- Patient now ambulating around the department without difficulty.  Appears well appears to be in no distress and he is asymptomatic at this time.  The patient and I discussed and I recommended admission to the hospital but with shared medical decision making, discussion with the patient's goals, his recovery, and his desire not to be hospitalized and to continue and will follow-up closely with his doctor.  I have also spoken with his primary care physician and primary care aware of today's presentation and will set up follow-up with the patient Tuesday.  He knows he can return to the ER at any time and advised he will do so if he has any ongoing or concerning symptoms   ----------------------------------------- 10:40 PM on 03/19/2022 ----------------------------------------- Workup reassuring.  Patient has been up ambulating doing feeling much improved. Patient comfortable with plan for discharge.  Friends will be picking him up he is not driving this evening.  Return precautions and treatment recommendations and follow-up discussed with the patient who is agreeable with the plan.   FINAL CLINICAL IMPRESSION(S) / ED DIAGNOSES   Final diagnoses:  Dehydration  Acute kidney injury superimposed on chronic kidney disease ([place])  Syncope and  collapse     Rx / DC Orders   ED Discharge Orders     None        Note:  This document was prepared using Dragon voice recognition software and may include unintentional dictation errors.   Delman Kitten, MD 03/19/22 2240

## 2022-03-19 NOTE — ED Provider Notes (Signed)
Discussed with patient's PCP Velna Hatchet) who knows patient well. Advises can follow-up very close with him if discharged. Discussed clinical presentaiton, CT results, labs from today. PCP advises renal function is atypical for him as well. Cr 1.8. PCP office will call him Tuesday to setup close follow-up   Delman Kitten, MD 03/19/22 2221

## 2022-03-19 NOTE — ED Triage Notes (Signed)
Pt arrives via [facility] from a family gathering- pt had a big meal and one mixed drink- pt then had a syncopal episode where his eyes rolled back and he was unconscious for about 3-4 seconds and then vomited when he came to

## 2022-03-19 NOTE — ED Notes (Signed)
Called on call MD for [place]. Holwerda MD

## 2022-03-19 NOTE — ED Provider Triage Note (Signed)
Emergency Medicine Provider Triage Evaluation Note  Gene Fletcher , a 71 y.o. male  was evaluated in triage. Per daughter, patient was standing in the kitchen after a family gathering when his eyes rolled back in his head and he vomited then was in a "transient state" for several seconds before coming back around. He then got clammy. She checked his glucose, which was 189. He denies complaint at this time.  Physical Exam  BP (!) 155/71 (BP Location: Left Arm)   Pulse 70   Temp 97.8 F (36.6 C) (Oral)   Resp 18   Ht '5\' 11"'$  (1.803 m)   Wt 94.8 kg   SpO2 100%   BMI 29.15 kg/m  Gen:   Awake, no distress   Resp:  Normal effort  MSK:   Moves extremities without difficulty  Other:    Medical Decision Making  Medically screening exam initiated at 5:55 PM.  Appropriate orders placed.  Karn Cassis was informed that the remainder of the evaluation will be completed by another provider, this initial triage assessment does not replace that evaluation, and the importance of remaining in the ED until their evaluation is complete.    Victorino Dike, FNP 03/19/22 1758

## 2022-06-23 ENCOUNTER — Other Ambulatory Visit (HOSPITAL_COMMUNITY): Payer: Self-pay | Admitting: Internal Medicine

## 2022-06-23 ENCOUNTER — Ambulatory Visit (HOSPITAL_BASED_OUTPATIENT_CLINIC_OR_DEPARTMENT_OTHER)
Admission: RE | Admit: 2022-06-23 | Discharge: 2022-06-23 | Disposition: A | Discharge status: Home / Self Care | Payer: 59 | Source: Ambulatory Visit | Attending: Internal Medicine | Admitting: Internal Medicine

## 2022-06-23 DIAGNOSIS — N184 Chronic kidney disease, stage 4 (severe): Secondary | ICD-10-CM

## 2022-06-23 DIAGNOSIS — R748 Abnormal levels of other serum enzymes: Secondary | ICD-10-CM | POA: Insufficient documentation

## 2022-06-23 DIAGNOSIS — D649 Anemia, unspecified: Secondary | ICD-10-CM | POA: Diagnosis present

## 2022-07-01 ENCOUNTER — Encounter: Payer: Self-pay | Admitting: Gastroenterology

## 2022-07-04 DIAGNOSIS — N184 Chronic kidney disease, stage 4 (severe): Secondary | ICD-10-CM | POA: Insufficient documentation

## 2022-07-04 DIAGNOSIS — Z794 Long term (current) use of insulin: Secondary | ICD-10-CM | POA: Insufficient documentation

## 2022-07-04 DIAGNOSIS — R600 Localized edema: Secondary | ICD-10-CM | POA: Insufficient documentation

## 2022-07-12 ENCOUNTER — Ambulatory Visit (AMBULATORY_SURGERY_CENTER): Payer: BC Managed Care – PPO | Admitting: *Deleted

## 2022-07-12 VITALS — Ht 71.0 in | Wt 200.0 lb

## 2022-07-12 DIAGNOSIS — Z1211 Encounter for screening for malignant neoplasm of colon: Secondary | ICD-10-CM

## 2022-07-12 NOTE — Progress Notes (Signed)
Pt's name and DOB verified at the beginning of the pre-visit.  Pt denies any difficulty with ambulating.  No egg or soy allergy known to patient  No issues known to pt with past sedation with any surgeries or procedures Patient denies ever being intubated Pt has no issues moving head neck or swallowing No FH of Malignant Hyperthermia Pt is not on diet pills Pt is not on home 02  Pt is not on blood thinners  Pt denies issues with constipation  Pt is not on dialysis Pt denies any upcoming cardiac testing Pt encouraged to use to use Singlecare or Goodrx to reduce cost  Patient's chart reviewed by Cathlyn Parsons CNRA prior to pre-visit and patient appropriate for the [facility].  Pre-visit completed and red dot placed by patient's name on their procedure day (on provider's schedule).  . Visit by phone Pt states weight is 200 lb Instructed pt why it is important to and  to call if they have any changes in health or new medications. Directed them to the # given and on instructions.   Pt states they will.  Instructions reviewed with pt and pt states understanding. Instructed to review again prior to procedure. Pt states they will.  Instructions sent by mail with coupon and by my chart

## 2022-07-18 ENCOUNTER — Encounter: Payer: Self-pay | Admitting: Gastroenterology

## 2022-07-20 ENCOUNTER — Other Ambulatory Visit: Payer: Self-pay

## 2022-07-20 ENCOUNTER — Emergency Department (HOSPITAL_COMMUNITY)
Admission: EM | Admit: 2022-07-20 | Discharge: 2022-07-20 | Disposition: A | Discharge status: Home / Self Care | Payer: 59 | Attending: Emergency Medicine | Admitting: Emergency Medicine

## 2022-07-20 ENCOUNTER — Emergency Department (HOSPITAL_COMMUNITY): Payer: 59

## 2022-07-20 ENCOUNTER — Encounter (HOSPITAL_COMMUNITY): Payer: Self-pay

## 2022-07-20 DIAGNOSIS — Z7984 Long term (current) use of oral hypoglycemic drugs: Secondary | ICD-10-CM | POA: Insufficient documentation

## 2022-07-20 DIAGNOSIS — S61212A Laceration without foreign body of right middle finger without damage to nail, initial encounter: Secondary | ICD-10-CM | POA: Insufficient documentation

## 2022-07-20 DIAGNOSIS — Z794 Long term (current) use of insulin: Secondary | ICD-10-CM | POA: Diagnosis not present

## 2022-07-20 DIAGNOSIS — E119 Type 2 diabetes mellitus without complications: Secondary | ICD-10-CM | POA: Insufficient documentation

## 2022-07-20 DIAGNOSIS — S62639B Displaced fracture of distal phalanx of unspecified finger, initial encounter for open fracture: Secondary | ICD-10-CM | POA: Diagnosis not present

## 2022-07-20 DIAGNOSIS — R55 Syncope and collapse: Secondary | ICD-10-CM | POA: Insufficient documentation

## 2022-07-20 DIAGNOSIS — N189 Chronic kidney disease, unspecified: Secondary | ICD-10-CM | POA: Insufficient documentation

## 2022-07-20 DIAGNOSIS — I129 Hypertensive chronic kidney disease with stage 1 through stage 4 chronic kidney disease, or unspecified chronic kidney disease: Secondary | ICD-10-CM | POA: Diagnosis not present

## 2022-07-20 DIAGNOSIS — Z23 Encounter for immunization: Secondary | ICD-10-CM | POA: Diagnosis not present

## 2022-07-20 DIAGNOSIS — M79644 Pain in right finger(s): Secondary | ICD-10-CM | POA: Diagnosis present

## 2022-07-20 DIAGNOSIS — W010XXA Fall on same level from slipping, tripping and stumbling without subsequent striking against object, initial encounter: Secondary | ICD-10-CM | POA: Diagnosis not present

## 2022-07-20 LAB — CBC WITH DIFFERENTIAL/PLATELET
Abs Immature Granulocytes: 0.03 10*3/uL (ref 0.00–0.07)
Basophils Absolute: 0.1 10*3/uL (ref 0.0–0.1)
Basophils Relative: 1 %
Eosinophils Absolute: 0.1 10*3/uL (ref 0.0–0.5)
Eosinophils Relative: 2 %
HCT: 27.6 % — ABNORMAL LOW (ref 39.0–52.0)
Hemoglobin: 9.1 g/dL — ABNORMAL LOW (ref 13.0–17.0)
Immature Granulocytes: 0 %
Lymphocytes Relative: 7 %
Lymphs Abs: 0.5 10*3/uL — ABNORMAL LOW (ref 0.7–4.0)
MCH: 31.5 pg (ref 26.0–34.0)
MCHC: 33 g/dL (ref 30.0–36.0)
MCV: 95.5 fL (ref 80.0–100.0)
Monocytes Absolute: 0.8 10*3/uL (ref 0.1–1.0)
Monocytes Relative: 12 %
Neutro Abs: 5.2 10*3/uL (ref 1.7–7.7)
Neutrophils Relative %: 78 %
Platelets: 246 10*3/uL (ref 150–400)
RBC: 2.89 MIL/uL — ABNORMAL LOW (ref 4.22–5.81)
RDW: 12 % (ref 11.5–15.5)
WBC: 6.7 10*3/uL (ref 4.0–10.5)
nRBC: 0 % (ref 0.0–0.2)

## 2022-07-20 LAB — BASIC METABOLIC PANEL
Anion gap: 11 (ref 5–15)
BUN: 35 mg/dL — ABNORMAL HIGH (ref 8–23)
CO2: 19 mmol/L — ABNORMAL LOW (ref 22–32)
Calcium: 8.2 mg/dL — ABNORMAL LOW (ref 8.9–10.3)
Chloride: 104 mmol/L (ref 98–111)
Creatinine, Ser: 2.25 mg/dL — ABNORMAL HIGH (ref 0.61–1.24)
GFR, Estimated: 30 mL/min — ABNORMAL LOW (ref >60)
Glucose, Bld: 207 mg/dL — ABNORMAL HIGH (ref 70–99)
Potassium: 4.1 mmol/L (ref 3.5–5.1)
Sodium: 134 mmol/L — ABNORMAL LOW (ref 135–145)

## 2022-07-20 LAB — MAGNESIUM: Magnesium: 1.6 mg/dL — ABNORMAL LOW (ref 1.7–2.4)

## 2022-07-20 LAB — CBG MONITORING, ED: Glucose-Capillary: 212 mg/dL — ABNORMAL HIGH (ref 70–99)

## 2022-07-20 MED ORDER — MAGNESIUM OXIDE -MG SUPPLEMENT 400 (240 MG) MG PO TABS
400.0000 mg | ORAL_TABLET | Freq: Once | ORAL | Status: AC
  Administered 2022-07-20: 400 mg via ORAL

## 2022-07-20 MED ORDER — LIDOCAINE HCL (PF) 1 % IJ SOLN
5.0000 mL | Freq: Once | INTRAMUSCULAR | Status: AC
  Administered 2022-07-20: 5 mL
  Filled 2022-07-20: qty 30

## 2022-07-20 MED ORDER — CEPHALEXIN 500 MG PO CAPS: 500.0000 mg | ORAL_CAPSULE | Freq: Four times a day (QID) | ORAL | 0 refills | Status: AC

## 2022-07-20 MED ORDER — CEFAZOLIN SODIUM-DEXTROSE 1-4 GM/50ML-% IV SOLN
1.0000 g | Freq: Once | INTRAVENOUS | Status: AC
  Administered 2022-07-20: 1 g via INTRAVENOUS
  Filled 2022-07-20: qty 50

## 2022-07-20 MED ORDER — SODIUM CHLORIDE 0.9 % IV BOLUS
1000.0000 mL | Freq: Once | INTRAVENOUS | Status: AC
  Administered 2022-07-20: 1000 mL via INTRAVENOUS

## 2022-07-20 MED ORDER — TETANUS-DIPHTH-ACELL PERTUSSIS 5-2.5-18.5 LF-MCG/0.5 IM SUSY
0.5000 mL | PREFILLED_SYRINGE | Freq: Once | INTRAMUSCULAR | Status: AC
  Administered 2022-07-20: 0.5 mL via INTRAMUSCULAR
  Filled 2022-07-20: qty 0.5

## 2022-07-20 NOTE — ED Triage Notes (Signed)
Pt to er, pt states that he passed out when standing outside today, states that after he came around they gave him some water and he felt better. Pt states that when he fell he hurt his R middle finger on the curb.

## 2022-07-20 NOTE — ED Provider Notes (Signed)
EMERGENCY DEPARTMENT AT [facility] Provider Note   CSN: [id number] Arrival date & time: 07/20/22  1726     History  Chief Complaint  Patient presents with   Near Syncope    Gene Fletcher is a 72 y.o. male.  Patient is a 72 year old male with a past medical history of hypertension, diabetes and CKD presenting to the emergency department after a near syncopal episode.  The patient states that he was out in the heat this afternoon when he started to feel lightheaded and dizzy.  He states that he slowly dropped to the ground and tried to brace himself with his right hand and ended up cutting his right middle finger.  He denies hitting his head or complete loss of consciousness.  He denies any recent nausea, vomiting or diarrhea.  He denies any chest pain or shortness of breath prior to the event.  He states that this has happened to him twice in the past before and has been attributed to dehydration.  He states that he is right-handed.  He states that he believes he is overdue for his tetanus vaccine.  He denies any other injuries from the fall.  The history is provided by the patient and the spouse.  Near Syncope       Home Medications Prior to Admission medications   Medication Sig Start Date End Date Taking? Authorizing Provider  cephALEXin (KEFLEX) 500 MG capsule Take 1 capsule (500 mg total) by mouth 4 (four) times daily for 7 days. 07/20/22 07/27/22 Yes Elayne Snare K, DO  ACCU-CHEK GUIDE test strip Check blood sugar q daily Dx-E11.65 In Vitro as directed for 90 days 06/11/21   [provider]  amLODipine (NORVASC) 5 MG tablet Take 5 mg by mouth daily. 06/22/22   [provider]  Blood Glucose Monitoring Suppl (ACCU-CHEK GUIDE) w/Device KIT Check blood sugar q daily Dx E-11.65 In Vitro as directed for 90 days 06/11/21   [provider]  cyclobenzaprine (FLEXERIL) 10 MG tablet Take 0.5-1 tablets (5-10 mg total) by mouth 3 (three)  times daily as needed for muscle spasms. Patient not taking: Reported on 12/16/2020 03/13/15   Ofilia Neas, PA-C  dextromethorphan 15 MG/5ML syrup Take 10 mLs (30 mg total) by mouth 4 (four) times daily as needed for cough. Patient not taking: Reported on 12/16/2020 06/16/15   Emi Holes, PA-C  glipiZIDE (GLUCOTROL XL) 10 MG 24 hr tablet Take 10 mg by mouth daily. Patient not taking: Reported on 12/16/2020 05/26/15   [provider]  insulin aspart protamine- aspart (NOVOLOG MIX 70/30) (70-30) 100 UNIT/ML injection Inject 20 Units into the skin daily with breakfast.    [provider]  Insulin Pen Needle (PEN NEEDLES) 32G X 4 MM MISC as directed . twice daily with insulin for 30 days    [provider]  lisinopril-hydrochlorothiazide (PRINZIDE,ZESTORETIC) 10-12.5 MG tablet Take 1 tablet by mouth daily. Patient not taking: Reported on 07/12/2022 05/26/15   [provider]  metFORMIN (GLUCOPHAGE) 1000 MG tablet Take 1,000 mg by mouth 2 (two) times daily with a meal.    [provider]  Pseudoeph-Doxylamine-DM-APAP (NYQUIL PO) Take 1 Dose by mouth daily as needed (cold symptoms). Patient not taking: Reported on 12/16/2020    [provider]  sitaGLIPtin-metformin (JANUMET) 50-1000 MG tablet Take 1 tablet by mouth 2 (two) times daily with a meal. Patient not taking: Reported on 12/16/2020    [provider]  Allergies    Patient has no known allergies.    Review of Systems   Review of Systems  Cardiovascular:  Positive for near-syncope.    Physical Exam Updated Vital Signs BP (!) 157/90   Pulse 76   Temp 98.2 F (36.8 C) (Oral)   Resp 17   Ht 5\' 11"  (1.803 m)   Wt 90.7 kg   SpO2 100%   BMI 27.89 kg/m  Physical Exam Vitals and nursing note reviewed.  Constitutional:      General: He is not in acute distress.    Appearance: Normal appearance.  HENT:     Head: Normocephalic and atraumatic.     Nose: Nose normal.      Mouth/Throat:     Mouth: Mucous membranes are moist.     Pharynx: Oropharynx is clear.  Eyes:     Extraocular Movements: Extraocular movements intact.     Conjunctiva/sclera: Conjunctivae normal.  Cardiovascular:     Rate and Rhythm: Normal rate and regular rhythm.     Heart sounds: Normal heart sounds.  Pulmonary:     Effort: Pulmonary effort is normal.     Breath sounds: Normal breath sounds.  Abdominal:     General: Abdomen is flat.     Palpations: Abdomen is soft.     Tenderness: There is no abdominal tenderness.  Musculoskeletal:        General: Normal range of motion.     Cervical back: Normal range of motion and neck supple.     Comments: Flexion/extension intact at DIP, PIP and MCP joint of right middle finger with mild tenderness to palpation at the tip of the digit, no other bony tenderness on exam  Skin:    General: Skin is warm and dry.     Comments: Approximately 1.5 cm flap laceration at distal tip of right middle finger with no active bleeding  Neurological:     General: No focal deficit present.     Mental Status: He is alert and oriented to person, place, and time.  Psychiatric:        Mood and Affect: Mood normal.        Behavior: Behavior normal.     ED Results / Procedures / Treatments   Labs (all labs ordered are listed, but only abnormal results are displayed) Labs Reviewed  BASIC METABOLIC PANEL - Abnormal; Notable for the following components:      Result Value   Sodium 134 (*)    CO2 19 (*)    Glucose, Bld 207 (*)    BUN 35 (*)    Creatinine, Ser 2.25 (*)    Calcium 8.2 (*)    GFR, Estimated 30 (*)    All other components within normal limits  CBC WITH DIFFERENTIAL/PLATELET - Abnormal; Notable for the following components:   RBC 2.89 (*)    Hemoglobin 9.1 (*)    HCT 27.6 (*)    Lymphs Abs 0.5 (*)    All other components within normal limits  MAGNESIUM - Abnormal; Notable for the following components:   Magnesium 1.6 (*)    All other  components within normal limits  CBG MONITORING, ED - Abnormal; Notable for the following components:   Glucose-Capillary 212 (*)    All other components within normal limits    EKG EKG Interpretation  Date/Time:  Wednesday Jul 20 2022 18:09:49 EDT Ventricular Rate:  85 PR Interval:  136 QRS Duration: 77 QT Interval:  344 QTC Calculation: 409 R Axis:  34 Text Interpretation: Sinus rhythm Abnormal R-wave progression, early transition Borderline repolarization abnormality T wave abnormality Abnormal ECG Confirmed by Gerhard Munch [phone]) on 07/20/2022 6:14:28 PM  Radiology DG Finger Middle Right  Result Date: 07/20/2022 CLINICAL DATA:  Fall EXAM: RIGHT MIDDLE FINGER 2+V COMPARISON:  None Available. FINDINGS: There is a comminuted fracture of the distal tuft of the third finger. There is 1 mm of distraction of fracture fragments. There is overlying soft tissue swelling and laceration. Small rectangular density seen adjacent to the tip on the lateral view may represent tiny fracture fragment versus foreign body. No dislocation. IMPRESSION: 1. Comminuted fracture of the distal tuft of the third finger. 2. Small rectangular density adjacent to the tip on the lateral view may represent tiny fracture fragment versus foreign body. Electronically Signed   By: Darliss Cheney M.D.   On: 07/20/2022 18:44    Procedures .Marland KitchenLaceration Repair  Date/Time: 07/20/2022 8:26 PM  Performed by: Rexford Maus, DO Authorized by: Rexford Maus, DO   Consent:    Consent obtained:  Verbal   Consent given by:  Patient   Risks, benefits, and alternatives were discussed: yes     Risks discussed:  Infection, pain, poor cosmetic result, poor wound healing, need for additional repair, nerve damage, retained foreign body, tendon damage and vascular damage   Alternatives discussed:  No treatment and delayed treatment Universal protocol:    Procedure explained and questions answered to patient or proxy's  satisfaction: yes     Imaging studies available: yes     Patient identity confirmed:  Verbally with patient Anesthesia:    Anesthesia method:  Nerve block   Block location:  R middle finger   Block needle gauge:  25 G   Block anesthetic:  Lidocaine 1% w/o epi   Block technique:  Digital ring block   Block injection procedure:  Anatomic landmarks identified, anatomic landmarks palpated, introduced needle, negative aspiration for blood and incremental injection   Block outcome:  Anesthesia achieved Laceration details:    Location:  Finger   Finger location:  R long finger   Length (cm):  2   Depth (mm):  2 Pre-procedure details:    Preparation:  Imaging obtained to evaluate for foreign bodies Exploration:    Limited defect created (wound extended): yes     Hemostasis achieved with:  Direct pressure   Imaging obtained: x-ray     Imaging outcome: foreign body noted     Wound exploration: wound explored through full range of motion and entire depth of wound visualized     Wound extent: underlying fracture     Wound extent: areolar tissue not violated, fascia not violated, no foreign body, no signs of injury, no nerve damage, no tendon damage and no vascular damage     Contaminated: no   Treatment:    Area cleansed with:  Saline   Amount of cleaning:  Standard   Irrigation solution:  Sterile saline   Irrigation method:  Pressure wash   Visualized foreign bodies/material removed: no     Debridement:  None   Undermining:  None   Scar revision: no   Skin repair:    Repair method:  Sutures   Suture size:  4-0   Suture material:  Prolene   Suture technique:  Simple interrupted   Number of sutures:  7 Approximation:    Approximation:  Close Repair type:    Repair type:  Simple Post-procedure details:    Dressing:  Adhesive  bandage and splint for protection   Procedure completion:  Tolerated well, no immediate complications     Medications Ordered in ED Medications  magnesium  oxide (MAG-OX) tablet 400 mg (has no administration in time range)  sodium chloride 0.9 % bolus 1,000 mL (1,000 mLs Intravenous New Bag/Given 07/20/22 1919)  lidocaine (PF) (XYLOCAINE) 1 % injection 5 mL (5 mLs Infiltration Given 07/20/22 1922)  Tdap (BOOSTRIX) injection 0.5 mL (0.5 mLs Intramuscular Given 07/20/22 1919)  ceFAZolin (ANCEF) IVPB 1 g/50 mL premix (1 g Intravenous New Bag/Given 07/20/22 1924)    ED Course/ Medical Decision Making/ A&P Clinical Course as of 07/20/22 2032  Wed Jul 20, 2022  1904 Tufts fracture on XR concerning for open fracture. He will be given ancef. [VK]  1948 Cr at baseline, mildly worsening anemia from last labs in our system, patient reported worsening anemia on recent outaptient labs. Mag will be repleted. [VK]    Clinical Course User Index [VK] Rexford Maus, DO                             Medical Decision Making This patient presents to the ED with chief complaint(s) of near syncope with pertinent past medical history of HTN, DM, CKD which further complicates the presenting complaint. The complaint involves an extensive differential diagnosis and also carries with it a high risk of complications and morbidity.    The differential diagnosis includes arrhythmia, anemia, dehydration, electrolyte abnormality, orthostatic hypotension, heat exhaustion, finger fracture dislocation, laceration, open fracture  Additional history obtained: Additional history obtained from spouse Records reviewed Care Everywhere/External Records  ED Course and Reassessment: On patient's arrival to the emergency department he is hemodynamically stable no acute distress and asymptomatic at this time.  Bleeding is controlled on his finger.  Accu-Chek on arrival showed mild hyperglycemia with a glucose of 212.  EKG on arrival showed normal sinus rhythm without any acute ischemic changes.  Patient will have labs including CBC and BMP to further evaluate for dehydration, anemia or  electrolyte abnormality as causes of his syncope.  He had an x-ray of his finger to evaluate for fracture and will require laceration repair. Tetanus will be updated  Independent labs interpretation:  The following labs were independently interpreted: mild hypomag, Cr at baseline, Hgb at baseline per patient on recent outpatient labs  Independent visualization of imaging: - I independently visualized the following imaging with scope of interpretation limited to determining acute life threatening conditions related to emergency care: R hand XR, which revealed tufts fracture of R middle finger  Consultation: - Consulted or discussed management/test interpretation w/ external professional: N/A  Consideration for admission or further workup: Patient has no emergent conditions requiring admission or further work-up at this time and is stable for discharge home with primary care and hand surgery follow-up  Social Determinants of health: N/A    Amount and/or Complexity of Data Reviewed Labs: ordered. Radiology: ordered.  Risk OTC drugs. Prescription drug management.          Final Clinical Impression(s) / ED Diagnoses Final diagnoses:  Near syncope  Open fracture of tuft of distal phalanx of finger  Laceration of right middle finger without foreign body without damage to nail, initial encounter    Rx / DC Orders ED Discharge Orders          Ordered    cephALEXin (KEFLEX) 500 MG capsule  4 times daily  07/20/22 2029              Elayne Snare K, DO 07/20/22 2032

## 2022-07-20 NOTE — Discharge Instructions (Addendum)
You were seen in the emergency department after episode of nearly passing out.  This is likely due to overheating being out in the heat.  Your labs showed no signs of worsening kidney function and your anemia seems to be at his baseline from your recent outpatient labs.  Your magnesium level is slightly low which we have repleted for you in the emergency department.  You did break the tip of your middle finger and we have placed you in a finger splint.  You had 7 stitches placed and we placed you on prophylactic antibiotics to prevent infection.  You can follow-up with your primary doctor as well as with the hand surgeon to have your symptoms and finger rechecked.  Your stitches should be removed in 7 to 10 days.  You should return to the emergency department for recurrent episodes of passing out, pus draining from your wound, spreading redness up your finger, fevers despite the antibiotics or any other new or concerning symptoms.

## 2022-07-26 ENCOUNTER — Encounter: Payer: BC Managed Care – PPO | Admitting: Gastroenterology

## 2022-09-27 ENCOUNTER — Telehealth: Payer: Self-pay | Admitting: Gastroenterology

## 2022-09-27 NOTE — Telephone Encounter (Signed)
Patient called to see when procedure appt was, not realizing it was tomorrow. Patient said he forgot and lost his instructions. Rescheduled 8/27 at 4 pm

## 2022-09-27 NOTE — Telephone Encounter (Signed)
No charge for this late cancellation but if it happens again he will need to be charged.

## 2022-09-28 ENCOUNTER — Encounter: Payer: BC Managed Care – PPO | Admitting: Gastroenterology

## 2022-10-26 ENCOUNTER — Ambulatory Visit: Payer: 59

## 2022-11-15 ENCOUNTER — Encounter: Payer: BC Managed Care – PPO | Admitting: Gastroenterology

## 2022-12-05 ENCOUNTER — Ambulatory Visit: Payer: 59

## 2022-12-05 VITALS — Ht 71.0 in | Wt 175.0 lb

## 2022-12-05 DIAGNOSIS — Z1211 Encounter for screening for malignant neoplasm of colon: Secondary | ICD-10-CM

## 2022-12-05 MED ORDER — PEG 3350-KCL-NA BICARB-NACL 420 G PO SOLR: 4000.0000 mL | Freq: Once | ORAL | 0 refills | Status: AC

## 2022-12-05 NOTE — Progress Notes (Signed)
No egg or soy allergy known to patient  No issues known to pt with past sedation with any surgeries or procedures Patient denies ever being told they had issues or difficulty with intubation  No FH of Malignant Hyperthermia Pt is not on diet pills Pt is not on  home 02  Pt is not on blood thinners  Pt reports constipation  No A fib or A flutter Have any cardiac testing pending--no   LOA: independent  Prep: Golytely   Patient's chart reviewed by Cathlyn Parsons CNRA prior to previsit and patient appropriate for the [facility].  Previsit completed and red dot placed by patient's name on their procedure day (on provider's schedule).     PV competed with patient. Prep instructions sent to home address

## 2023-01-04 ENCOUNTER — Encounter: Payer: Self-pay | Admitting: Gastroenterology

## 2023-01-04 ENCOUNTER — Ambulatory Visit: Payer: 59 | Admitting: Gastroenterology

## 2023-01-04 VITALS — BP 160/76 | HR 66 | Temp 97.5°F | Resp 17 | Ht 71.0 in | Wt 175.0 lb

## 2023-01-04 DIAGNOSIS — D125 Benign neoplasm of sigmoid colon: Secondary | ICD-10-CM | POA: Diagnosis not present

## 2023-01-04 DIAGNOSIS — D123 Benign neoplasm of transverse colon: Secondary | ICD-10-CM

## 2023-01-04 DIAGNOSIS — D122 Benign neoplasm of ascending colon: Secondary | ICD-10-CM | POA: Diagnosis not present

## 2023-01-04 DIAGNOSIS — D128 Benign neoplasm of rectum: Secondary | ICD-10-CM

## 2023-01-04 DIAGNOSIS — D127 Benign neoplasm of rectosigmoid junction: Secondary | ICD-10-CM | POA: Diagnosis not present

## 2023-01-04 DIAGNOSIS — Z1211 Encounter for screening for malignant neoplasm of colon: Secondary | ICD-10-CM

## 2023-01-04 MED ORDER — SODIUM CHLORIDE 0.9 % IV SOLN: 500.0000 mL | Freq: Once | INTRAVENOUS | Status: DC

## 2023-01-04 NOTE — Progress Notes (Signed)
GASTROENTEROLOGY PROCEDURE H&P NOTE   Primary Care Physician: Alysia Penna, MD  HPI: Gene Fletcher is a 72 y.o. male who presents for Colonoscopy for screening.  Past Medical History:  Diagnosis Date   Arthritis    Diabetes mellitus without complication (HCC)    Hyperlipidemia    Hypertension    Past Surgical History:  Procedure Laterality Date   COLONOSCOPY     EYE SURGERY     Current Outpatient Medications  Medication Sig Dispense Refill   ACCU-CHEK GUIDE test strip Check blood sugar q daily Dx-E11.65 In Vitro as directed for 90 days     amLODipine (NORVASC) 5 MG tablet Take 5 mg by mouth daily.     Blood Glucose Monitoring Suppl (ACCU-CHEK GUIDE) w/Device KIT Check blood sugar q daily Dx E-11.65 In Vitro as directed for 90 days     insulin aspart protamine- aspart (NOVOLOG MIX 70/30) (70-30) 100 UNIT/ML injection Inject 20 Units into the skin daily with breakfast.     Insulin Pen Needle (PEN NEEDLES) 32G X 4 MM MISC as directed . twice daily with insulin for 30 days     metFORMIN (GLUCOPHAGE) 1000 MG tablet Take 1,000 mg by mouth 2 (two) times daily with a meal.     cyclobenzaprine (FLEXERIL) 10 MG tablet Take 0.5-1 tablets (5-10 mg total) by mouth 3 (three) times daily as needed for muscle spasms. (Patient not taking: Reported on 12/16/2020) 30 tablet 0   dextromethorphan 15 MG/5ML syrup Take 10 mLs (30 mg total) by mouth 4 (four) times daily as needed for cough. (Patient not taking: Reported on 12/16/2020) 120 mL 0   glipiZIDE (GLUCOTROL XL) 10 MG 24 hr tablet Take 10 mg by mouth daily. (Patient not taking: Reported on 12/16/2020)     lisinopril-hydrochlorothiazide (PRINZIDE,ZESTORETIC) 10-12.5 MG tablet Take 1 tablet by mouth daily. (Patient not taking: Reported on 07/12/2022)     Pseudoeph-Doxylamine-DM-APAP (NYQUIL PO) Take 1 Dose by mouth daily as needed (cold symptoms). (Patient not taking: Reported on 12/16/2020)     sitaGLIPtin-metformin (JANUMET) 50-1000 MG  tablet Take 1 tablet by mouth 2 (two) times daily with a meal. (Patient not taking: Reported on 12/16/2020)     Current Facility-Administered Medications  Medication Dose Route Frequency Provider Last Rate Last Admin   0.9 %  sodium chloride infusion  500 mL Intravenous Once Mansouraty, Netty Starring., MD        Current Outpatient Medications:    ACCU-CHEK GUIDE test strip, Check blood sugar q daily Dx-E11.65 In Vitro as directed for 90 days, Disp: , Rfl:    amLODipine (NORVASC) 5 MG tablet, Take 5 mg by mouth daily., Disp: , Rfl:    Blood Glucose Monitoring Suppl (ACCU-CHEK GUIDE) w/Device KIT, Check blood sugar q daily Dx E-11.65 In Vitro as directed for 90 days, Disp: , Rfl:    insulin aspart protamine- aspart (NOVOLOG MIX 70/30) (70-30) 100 UNIT/ML injection, Inject 20 Units into the skin daily with breakfast., Disp: , Rfl:    Insulin Pen Needle (PEN NEEDLES) 32G X 4 MM MISC, as directed . twice daily with insulin for 30 days, Disp: , Rfl:    metFORMIN (GLUCOPHAGE) 1000 MG tablet, Take 1,000 mg by mouth 2 (two) times daily with a meal., Disp: , Rfl:    cyclobenzaprine (FLEXERIL) 10 MG tablet, Take 0.5-1 tablets (5-10 mg total) by mouth 3 (three) times daily as needed for muscle spasms. (Patient not taking: Reported on 12/16/2020), Disp: 30 tablet, Rfl: 0  dextromethorphan 15 MG/5ML syrup, Take 10 mLs (30 mg total) by mouth 4 (four) times daily as needed for cough. (Patient not taking: Reported on 12/16/2020), Disp: 120 mL, Rfl: 0   glipiZIDE (GLUCOTROL XL) 10 MG 24 hr tablet, Take 10 mg by mouth daily. (Patient not taking: Reported on 12/16/2020), Disp: , Rfl:    lisinopril-hydrochlorothiazide (PRINZIDE,ZESTORETIC) 10-12.5 MG tablet, Take 1 tablet by mouth daily. (Patient not taking: Reported on 07/12/2022), Disp: , Rfl:    Pseudoeph-Doxylamine-DM-APAP (NYQUIL PO), Take 1 Dose by mouth daily as needed (cold symptoms). (Patient not taking: Reported on 12/16/2020), Disp: , Rfl:    sitaGLIPtin-metformin  (JANUMET) 50-1000 MG tablet, Take 1 tablet by mouth 2 (two) times daily with a meal. (Patient not taking: Reported on 12/16/2020), Disp: , Rfl:   Current Facility-Administered Medications:    0.9 %  sodium chloride infusion, 500 mL, Intravenous, Once, Mansouraty, Netty Starring., MD No Known Allergies Family History  Problem Relation Age of Onset   Diabetes Mother    Diabetes Father    Diabetes Sister    Diabetes Brother    Colon polyps Neg Hx    Colon cancer Neg Hx    Rectal cancer Neg Hx    Stomach cancer Neg Hx    Esophageal cancer Neg Hx    Social History   Socioeconomic History   Marital status: Married    Spouse name: Not on file   Number of children: 1   Years of education: Not on file   Highest education level: Not on file  Occupational History   Not on file  Tobacco Use   Smoking status: Never   Smokeless tobacco: Never  Vaping Use   Vaping status: Never Used  Substance and Sexual Activity   Alcohol use: Yes    Alcohol/week: 2.0 standard drinks of alcohol    Types: 2 Standard drinks or equivalent per week   Drug use: No   Sexual activity: Not on file  Other Topics Concern   Not on file  Social History Narrative   Retired- [employer]   Social Determinants of Corporate investment banker Strain: Not on file  Food Insecurity: Not on file  Transportation Needs: Not on file  Physical Activity: Not on file  Stress: Not on file  Social Connections: Not on file  Intimate Partner Violence: Not on file    Physical Exam: Today's Vitals   01/04/23 0958  BP: (!) 151/77  Pulse: 87  Temp: (!) 97.5 F (36.4 C)  TempSrc: Skin  SpO2: 100%  Weight: 175 lb (79.4 kg)  Height: 5\' 11"  (1.803 m)   Body mass index is 24.41 kg/m. GEN: NAD EYE: Sclerae anicteric ENT: MMM CV: Non-tachycardic GI: Soft, NT/ND NEURO:  Alert & Oriented x 3  Lab Results: No results for input(s): "WBC", "HGB", "HCT", "PLT" in the last 72 hours. BMET No results for input(s): "NA", "K",  "CL", "CO2", "GLUCOSE", "BUN", "CREATININE", "CALCIUM" in the last 72 hours. LFT No results for input(s): "PROT", "ALBUMIN", "AST", "ALT", "ALKPHOS", "BILITOT", "BILIDIR", "IBILI" in the last 72 hours. PT/INR No results for input(s): "LABPROT", "INR" in the last 72 hours.   Impression / Plan: This is a 72 y.o.male who presents for Colonoscopy for screening.  The risks and benefits of endoscopic evaluation/treatment were discussed with the patient and/or family; these include but are not limited to the risk of perforation, infection, bleeding, missed lesions, lack of diagnosis, severe illness requiring hospitalization, as well as anesthesia and sedation  related illnesses.  The patient's history has been reviewed, patient examined, no change in status, and deemed stable for procedure.  The patient and/or family is agreeable to proceed.    Corliss Parish, MD [facility] Office # [phone]

## 2023-01-04 NOTE — Patient Instructions (Addendum)
-  Handout on polyps, hemorrhoids provided -await pathology results -repeat colonoscopy for surveillance recommended. Date to be determined when pathology result become available   -Continue present medications . - High fiber diet.(SEE HANDOUT) - Use FiberCon 1-2 tablets PO daily. Get over the counter    YOU HAD AN ENDOSCOPIC PROCEDURE TODAY AT THE [facility]:   Refer to the procedure report that was given to you for any specific questions about what was found during the examination.  If the procedure report does not answer your questions, please call your gastroenterologist to clarify.  If you requested that your care partner not be given the details of your procedure findings, then the procedure report has been included in a sealed envelope for you to review at your convenience later.  YOU SHOULD EXPECT: Some feelings of bloating in the abdomen. Passage of more gas than usual.  Walking can help get rid of the air that was put into your GI tract during the procedure and reduce the bloating. If you had a lower endoscopy (such as a colonoscopy or flexible sigmoidoscopy) you may notice spotting of blood in your stool or on the toilet paper. If you underwent a bowel prep for your procedure, you may not have a normal bowel movement for a few days.  Please Note:  You might notice some irritation and congestion in your nose or some drainage.  This is from the oxygen used during your procedure.  There is no need for concern and it should clear up in a day or so.  SYMPTOMS TO REPORT IMMEDIATELY:  Following lower endoscopy (colonoscopy or flexible sigmoidoscopy):  Excessive amounts of blood in the stool  Significant tenderness or worsening of abdominal pains  Swelling of the abdomen that is new, acute  Fever of 100F or higher  For urgent or emergent issues, a gastroenterologist can be reached at any hour by calling [phone]. Do not use MyChart messaging for urgent concerns.     DIET:  We do recommend a small meal at first, but then you may proceed to your regular diet.  Drink plenty of fluids but you should avoid alcoholic beverages for 24 hours.  ACTIVITY:  You should plan to take it easy for the rest of today and you should NOT DRIVE or use heavy machinery until tomorrow (because of the sedation medicines used during the test).    FOLLOW UP: Our staff will call the number listed on your records the next business day following your procedure.  We will call around 7:15- 8:00 am to check on you and address any questions or concerns that you may have regarding the information given to you following your procedure. If we do not reach you, we will leave a message.     If any biopsies were taken you will be contacted by phone or by letter within the next 1-3 weeks.  Please call us at [phone] if you have not heard about the biopsies in 3 weeks.    SIGNATURES/CONFIDENTIALITY: You and/or your care partner have signed paperwork which will be entered into your electronic medical record.  These signatures attest to the fact that that the information above on your After Visit Summary has been reviewed and is understood.  Full responsibility of the confidentiality of this discharge information lies with you and/or your care-partner.

## 2023-01-04 NOTE — Progress Notes (Signed)
Pt's states no medical or surgical changes since previsit or office visit. VS assessed by C.W 

## 2023-01-04 NOTE — Op Note (Signed)
[facility] Patient Name: Gene Fletcher Procedure Date: 01/04/2023 10:13 AM MRN: [id number] Endoscopist: Corliss Parish , MD, [id number] Age: 72 Referring MD:  Date of Birth: 1950/06/18 Gender: Male Account #: [id number] Procedure:                Colonoscopy Indications:              Screening for colorectal malignant neoplasm Medicines:                Monitored Anesthesia Care Procedure:                Pre-Anesthesia Assessment:                           - Prior to the procedure, a History and Physical                            was performed, and patient medications and                            allergies were reviewed. The patient's tolerance of                            previous anesthesia was also reviewed. The risks                            and benefits of the procedure and the sedation                            options and risks were discussed with the patient.                            All questions were answered, and informed consent                            was obtained. Prior Anticoagulants: The patient has                            taken no anticoagulant or antiplatelet agents. ASA                            Grade Assessment: II - A patient with mild systemic                            disease. After reviewing the risks and benefits,                            the patient was deemed in satisfactory condition to                            undergo the procedure.                           After obtaining informed consent, the colonoscope  was passed under direct vision. Throughout the                            procedure, the patient's blood pressure, pulse, and                            oxygen saturations were monitored continuously. The                            Olympus Scope [id number] was introduced through the                            anus and advanced to the 3 cm into the ileum. The                             colonoscopy was performed without difficulty. The                            patient tolerated the procedure. The quality of the                            bowel preparation was adequate. The terminal ileum,                            ileocecal valve, appendiceal orifice, and rectum                            were photographed. Scope In: 10:22:11 AM Scope Out: 10:48:56 AM Scope Withdrawal Time: 0 hours 21 minutes 18 seconds  Total Procedure Duration: 0 hours 26 minutes 45 seconds  Findings:                 The digital rectal exam findings include                            hemorrhoids. Pertinent negatives include no                            palpable rectal lesions.                           A large amount of semi-liquid stool was found in                            the entire colon, interfering with visualization.                            Lavage of the area was performed using copious                            amounts, resulting in clearance with adequate                            visualization.  The colon (entire examined portion) was                            significantly redundant.                           Ten sessile and semi-sessile polyps were found in                            the rectum (3), sigmoid colon (1), splenic flexure                            (3), transverse colon (1) and ascending colon (2).                            The polyps were 3 to 14 mm in size. These polyps                            were removed with a cold snare. Resection and                            retrieval were complete.                           Normal mucosa was found in the entire colon                            otherwise.                           Anal papilla was hypertrophied.                           Non-bleeding non-thrombosed internal hemorrhoids                            were found during retroflexion, during perianal                            exam and  during digital exam. The hemorrhoids were                            Grade II (internal hemorrhoids that prolapse but                            reduce spontaneously). Complications:            No immediate complications. Estimated Blood Loss:     Estimated blood loss was minimal. Impression:               - Hemorrhoids found on digital rectal exam.                           - Stool in the entire examined colon - lavaged with  adequate visualization.                           - Redundant colon.                           - Ten, 3 to 14 mm polyps in the rectum, in the                            sigmoid colon, at the splenic flexure, in the                            transverse colon and in the ascending colon,                            removed with a cold snare. Resected and retrieved.                           - Normal mucosa in the entire examined colon                            otherwise.                           - Anal papilla was hypertrophied.                           - Non-bleeding non-thrombosed internal hemorrhoids. Recommendation:           - The patient will be observed post-procedure,                            until all discharge criteria are met.                           - Discharge patient to home.                           - Patient has a contact number available for                            emergencies. The signs and symptoms of potential                            delayed complications were discussed with the                            patient. Return to normal activities tomorrow.                            Written discharge instructions were provided to the                            patient.                           -  High fiber diet.                           - Use FiberCon 1-2 tablets PO daily.                           - Continue present medications.                           - Await pathology results.                           -  Repeat colonoscopy in 1 versus 3 years for                            surveillance (based on final pathology?"if all                            polyps removed were adenomas then 1 year versus 3                            years).                           - The findings and recommendations were discussed                            with the patient.                           - The findings and recommendations were discussed                            with the patient's family. Corliss Parish, MD 01/04/2023 10:55:08 AM

## 2023-01-04 NOTE — Progress Notes (Signed)
Report to PACU, RN, vss, BBS= Clear.  

## 2023-01-05 ENCOUNTER — Telehealth: Payer: Self-pay

## 2023-01-05 NOTE — Telephone Encounter (Signed)
  Follow up Call-     01/04/2023   10:05 AM  Call back number  Post procedure Call Back phone  # [phone]  Permission to leave phone message Yes     Patient questions:  Do you have a fever, pain , or abdominal swelling? No. Pain Score  0 *  Have you tolerated food without any problems? Yes.    Have you been able to return to your normal activities? Yes.    Do you have any questions about your discharge instructions: Diet   No. Medications  No. Follow up visit  No.  Do you have questions or concerns about your Care? No.  Actions: * If pain score is 4 or above: No action needed, pain <4.

## 2023-01-06 ENCOUNTER — Encounter: Payer: Self-pay | Admitting: Gastroenterology

## 2023-01-06 LAB — SURGICAL PATHOLOGY

## 2023-01-23 ENCOUNTER — Telehealth: Payer: Self-pay | Admitting: Gastroenterology

## 2023-01-23 NOTE — Telephone Encounter (Signed)
Inbound call from patient in regards to lab results. Please advise.   Patient will not be available until 3 pm but says a message can be left on his voicemail and he will give [place] a call back .  Thank you

## 2023-01-23 NOTE — Telephone Encounter (Signed)
[facility]                                                                                                   [address] [place] [address]                                                                               Dear Mr. Mau,   The polyps removed from your colon were all tubular adenomas.  These are precancerous polyps meaning that they had the potential to change into cancer over time had they not been removed.   Due to the number of precancerous polyps that were found on this procedure, I recommend you have a repeat colonoscopy in 1 year to determine if you have developed any new polyps and to screen for colorectal cancer.   If you develop any new rectal bleeding, abdominal pain or significant bowel habit changes, please contact us before then at [phone].   Please call us if you have persistent problems or have questions about your condition that have not been fully answered at this time.     Sincerely,     Lemar Lofty., MD

## 2023-01-23 NOTE — Telephone Encounter (Signed)
The pt has been advised of the pathology letter. He will expect a copy in the mail. He will call back with any questions or concerns.  Recall has been entered

## 2023-02-13 ENCOUNTER — Telehealth: Payer: Self-pay | Admitting: Nurse Practitioner

## 2023-02-13 DIAGNOSIS — Z780 Asymptomatic menopausal state: Secondary | ICD-10-CM

## 2023-02-13 NOTE — Telephone Encounter (Signed)
-----   Message from [facility] Ashlee G sent at 02/10/2023  4:44 PM EST ----- Regarding: bone density PT called to schedule bone density appointment and was told by staffing that order has expired and need to be resubmitted.

## 2023-02-13 NOTE — Telephone Encounter (Signed)
Called patient to inform of new referral was placed. PT didn't answer; left brief VM.

## 2023-02-22 ENCOUNTER — Other Ambulatory Visit: Payer: Self-pay | Admitting: Adult Health

## 2023-02-22 DIAGNOSIS — R634 Abnormal weight loss: Secondary | ICD-10-CM

## 2023-02-22 DIAGNOSIS — R053 Chronic cough: Secondary | ICD-10-CM

## 2023-02-22 DIAGNOSIS — D649 Anemia, unspecified: Secondary | ICD-10-CM

## 2023-02-23 ENCOUNTER — Other Ambulatory Visit: Payer: Self-pay | Admitting: Adult Health

## 2023-02-23 DIAGNOSIS — F809 Developmental disorder of speech and language, unspecified: Secondary | ICD-10-CM

## 2023-02-23 DIAGNOSIS — R55 Syncope and collapse: Secondary | ICD-10-CM

## 2023-02-23 DIAGNOSIS — D649 Anemia, unspecified: Secondary | ICD-10-CM

## 2023-02-23 DIAGNOSIS — R634 Abnormal weight loss: Secondary | ICD-10-CM

## 2023-02-24 ENCOUNTER — Ambulatory Visit
Admission: RE | Admit: 2023-02-24 | Discharge: 2023-02-24 | Disposition: A | Discharge status: Home / Self Care | Payer: 59 | Source: Ambulatory Visit | Attending: Adult Health | Admitting: Adult Health

## 2023-02-24 ENCOUNTER — Other Ambulatory Visit: Payer: Self-pay | Admitting: Internal Medicine

## 2023-02-24 DIAGNOSIS — R053 Chronic cough: Secondary | ICD-10-CM

## 2023-02-24 DIAGNOSIS — R634 Abnormal weight loss: Secondary | ICD-10-CM

## 2023-02-24 DIAGNOSIS — D649 Anemia, unspecified: Secondary | ICD-10-CM

## 2023-02-24 NOTE — Progress Notes (Signed)
LM with provider office regarding patient stage 4 CKD and contrast- awaiting call back

## 2023-03-01 ENCOUNTER — Ambulatory Visit
Admission: RE | Admit: 2023-03-01 | Discharge: 2023-03-01 | Disposition: A | Discharge status: Home / Self Care | Payer: 59 | Source: Ambulatory Visit | Attending: Adult Health | Admitting: Adult Health

## 2023-03-01 DIAGNOSIS — R634 Abnormal weight loss: Secondary | ICD-10-CM

## 2023-03-01 DIAGNOSIS — R55 Syncope and collapse: Secondary | ICD-10-CM

## 2023-03-01 DIAGNOSIS — D649 Anemia, unspecified: Secondary | ICD-10-CM

## 2023-03-01 DIAGNOSIS — F809 Developmental disorder of speech and language, unspecified: Secondary | ICD-10-CM

## 2023-03-01 MED ORDER — GADOPICLENOL 0.5 MMOL/ML IV SOLN
7.0000 mL | Freq: Once | INTRAVENOUS | Status: AC | PRN
  Administered 2023-03-01: 7 mL via INTRAVENOUS

## 2023-04-03 ENCOUNTER — Ambulatory Visit: Payer: No Typology Code available for payment source | Admitting: Neurology

## 2023-05-09 ENCOUNTER — Ambulatory Visit (INDEPENDENT_AMBULATORY_CARE_PROVIDER_SITE_OTHER): Payer: 59 | Admitting: Diagnostic Neuroimaging

## 2023-05-09 ENCOUNTER — Encounter: Payer: Self-pay | Admitting: Diagnostic Neuroimaging

## 2023-05-09 VITALS — BP 142/74 | HR 65 | Ht 71.0 in | Wt 138.2 lb

## 2023-05-09 DIAGNOSIS — R292 Abnormal reflex: Secondary | ICD-10-CM

## 2023-05-09 DIAGNOSIS — R531 Weakness: Secondary | ICD-10-CM

## 2023-05-09 DIAGNOSIS — R634 Abnormal weight loss: Secondary | ICD-10-CM

## 2023-05-09 NOTE — Progress Notes (Signed)
[facility]  PATIENT: Gene Fletcher DOB: 04/12/1950  REFERRING CLINICIAN: Alysia Penna, MD HISTORY FROM: patient, wife, daughter REASON FOR VISIT: new consult   HISTORICAL  CHIEF COMPLAINT:  Chief Complaint  Patient presents with   New Patient (Initial Visit)    Pt in room 6. Wife and daughter in room. New patient paper referral abnormal brain MRI. Daughter said she has noticed slight hand tremors in right hand at times.  Daughter reports pt has passed out twice.    HISTORY OF PRESENT ILLNESS:   73 year old male with history of renal insufficiency diabetes hypertension, here for evaluation of progressive weakness, weight loss, speech difficulty, cognitive difficulty, tremors.  December 2023 patient had dinner with friends, had a long LMAC, got tired and postop/passed out at the table.  EMS was called.  He was taken to the hospital for evaluation and was diagnosed with dehydration.  May 2024 patient had another episode of overheating, dehydration and near syncope.  At this time patient weighed 90kg.  Soon after this patient lost his appetite, started eating less and started to have profound weight loss. Now patient is 63kg. He had generalized atrophy of muscles.  He started to have changes in his speech and swallowing.  Family has noticed some mild memory and cognitive changes.  He is having trouble with mobility and walking.  Had CT scan of the chest which showed pulmonary nodule/mass, which was recommended to be biopsied but patient opted for monitoring.  Had MRI of the brain which showed some white matter changes and patient was referred here for further evaluation.   REVIEW OF SYSTEMS: Full 14 system review of systems performed and negative with exception of: as per HPI.  ALLERGIES: No Known Allergies  HOME MEDICATIONS: Outpatient Medications Prior to Visit  Medication Sig Dispense Refill   amLODipine (NORVASC) 5 MG tablet Take 5 mg by mouth  daily.     ACCU-CHEK GUIDE test strip Check blood sugar q daily Dx-E11.65 In Vitro as directed for 90 days (Patient not taking: Reported on 05/09/2023)     Blood Glucose Monitoring Suppl (ACCU-CHEK GUIDE) w/Device KIT Check blood sugar q daily Dx E-11.65 In Vitro as directed for 90 days (Patient not taking: Reported on 05/09/2023)     cyclobenzaprine (FLEXERIL) 10 MG tablet Take 0.5-1 tablets (5-10 mg total) by mouth 3 (three) times daily as needed for muscle spasms. (Patient not taking: Reported on 12/16/2020) 30 tablet 0   dextromethorphan 15 MG/5ML syrup Take 10 mLs (30 mg total) by mouth 4 (four) times daily as needed for cough. (Patient not taking: Reported on 05/09/2023) 120 mL 0   glipiZIDE (GLUCOTROL XL) 10 MG 24 hr tablet Take 10 mg by mouth daily. (Patient not taking: Reported on 12/16/2020)     insulin aspart protamine- aspart (NOVOLOG MIX 70/30) (70-30) 100 UNIT/ML injection Inject 20 Units into the skin daily with breakfast. (Patient not taking: Reported on 05/09/2023)     Insulin Pen Needle (PEN NEEDLES) 32G X 4 MM MISC as directed . twice daily with insulin for 30 days (Patient not taking: Reported on 05/09/2023)     lisinopril-hydrochlorothiazide (PRINZIDE,ZESTORETIC) 10-12.5 MG tablet Take 1 tablet by mouth daily. (Patient not taking: Reported on 07/12/2022)     metFORMIN (GLUCOPHAGE) 1000 MG tablet Take 1,000 mg by mouth 2 (two) times daily with a meal. (Patient not taking: Reported on 05/09/2023)     Pseudoeph-Doxylamine-DM-APAP (NYQUIL PO) Take 1 Dose by mouth daily as needed (cold symptoms). (Patient  not taking: Reported on 12/16/2020)     sitaGLIPtin-metformin (JANUMET) 50-1000 MG tablet Take 1 tablet by mouth 2 (two) times daily with a meal. (Patient not taking: Reported on 12/16/2020)     No facility-administered medications prior to visit.    PAST MEDICAL HISTORY: Past Medical History:  Diagnosis Date   Arthritis    Diabetes mellitus without complication (HCC)    Hyperlipidemia     Hypertension     PAST SURGICAL HISTORY: Past Surgical History:  Procedure Laterality Date   COLONOSCOPY     EYE SURGERY      FAMILY HISTORY: Family History  Problem Relation Age of Onset   Diabetes Mother    Diabetes Father    Diabetes Sister    Diabetes Brother    Colon polyps Neg Hx    Colon cancer Neg Hx    Rectal cancer Neg Hx    Stomach cancer Neg Hx    Esophageal cancer Neg Hx     SOCIAL HISTORY: Social History   Socioeconomic History   Marital status: Married    Spouse name: Not on file   Number of children: 1   Years of education: Not on file   Highest education level: Not on file  Occupational History   Not on file  Tobacco Use   Smoking status: Never   Smokeless tobacco: Never  Vaping Use   Vaping status: Never Used  Substance and Sexual Activity   Alcohol use: Not Currently    Alcohol/week: 2.0 standard drinks of alcohol    Types: 2 Standard drinks or equivalent per week   Drug use: No   Sexual activity: Not on file  Other Topics Concern   Not on file  Social History Narrative   Retired- [employer]      Right handed    Wear glasses    Drinks green tea daily    Social Drivers of Corporate investment banker Strain: Not on file  Food Insecurity: Not on file  Transportation Needs: Not on file  Physical Activity: Not on file  Stress: Not on file  Social Connections: Not on file  Intimate Partner Violence: Not on file     PHYSICAL EXAM  GENERAL EXAM/CONSTITUTIONAL: Vitals:  Vitals:   05/09/23 1004  BP: (!) 142/74  Pulse: 65  Weight: 138 lb 3.2 oz (62.7 kg)  Height: 5\' 11"  (1.803 m)   Body mass index is 19.27 kg/m. Wt Readings from Last 3 Encounters:  05/09/23 138 lb 3.2 oz (62.7 kg)  01/04/23 175 lb (79.4 kg)  12/05/22 175 lb (79.4 kg)   Patient is in no distress; well developed, nourished and groomed; neck is supple  CARDIOVASCULAR: Examination of carotid arteries is normal; no carotid bruits Regular rate and rhythm, no  murmurs Examination of peripheral vascular system by observation and palpation is normal  EYES: Ophthalmoscopic exam of optic discs and posterior segments is normal; no papilledema or hemorrhages No results found.  MUSCULOSKELETAL: Gait, strength, tone, movements noted in Neurologic exam below  NEUROLOGIC: MENTAL STATUS:      No data to display         awake, alert, oriented to person, place and time recent and remote memory intact normal attention and concentration language fluent, comprehension intact, naming intact fund of knowledge appropriate  CRANIAL NERVE:  2nd - no papilledema on fundoscopic exam 2nd, 3rd, 4th, 6th - pupils equal and reactive to light, visual fields full to confrontation, extraocular muscles --> DECR UPGAZE  AND LATERAL GAZE; POSSIBLE OCULOMOTOR APRAXIA, no nystagmus; PROPTOSIS 5th - facial sensation symmetric 7th - facial strength symmetric 8th - hearing intact 9th - palate elevates symmetrically, uvula midline 11th - shoulder shrug symmetric 12th - tongue protrusion midline SLOW SPEECH; MILD HOARSE VOICE  MOTOR:  MUSCLE ATROPHY; RARE FASCICULATIONS IN DELTOIDS AND CALVES; INCREASE TONE IN BUE BUE 3-4; BLE 3-4  SENSORY:  normal and symmetric to light touch, temperature, vibration  COORDINATION:  finger-nose-finger, fine finger movements SLOW  REFLEXES:  deep tendon reflexes --> BUE 3+; KNEES 3+; ANKLES 1+ and symmetric BILATERAL POSITIVE HOFFMANS POSITIVE SNOUT REFLEX  GAIT/STATION:  narrow based gait    DIAGNOSTIC DATA (LABS, IMAGING, TESTING) - I reviewed patient records, labs, notes, testing and imaging myself where available.  Lab Results  Component Value Date   WBC 6.7 07/20/2022   HGB 9.1 (L) 07/20/2022   HCT 27.6 (L) 07/20/2022   MCV 95.5 07/20/2022   PLT 246 07/20/2022      Component Value Date/Time   NA 134 (L) 07/20/2022 1903   K 4.1 07/20/2022 1903   CL 104 07/20/2022 1903   CO2 19 (L) 07/20/2022 1903    GLUCOSE 207 (H) 07/20/2022 1903   BUN 35 (H) 07/20/2022 1903   CREATININE 2.25 (H) 07/20/2022 1903   CALCIUM 8.2 (L) 07/20/2022 1903   PROT 6.5 06/16/2015 1435   ALBUMIN 3.6 06/16/2015 1435   AST 20 06/16/2015 1435   ALT 13 (L) 06/16/2015 1435   ALKPHOS 75 06/16/2015 1435   BILITOT 0.7 06/16/2015 1435   GFRNONAA 30 (L) 07/20/2022 1903   GFRAA 46 (L) 06/16/2015 1435   Lab Results  Component Value Date   CHOL 169 12/10/2009   HDL 28 (L) 12/10/2009   LDLCALC 119 (H) 12/10/2009   TRIG 112 12/10/2009   CHOLHDL 6.0 Ratio 12/10/2009   No results found for: "HGBA1C" No results found for: "VITAMINB12" Lab Results  Component Value Date   TSH 2.204 03/19/2022   A1c 5.9  02/24/23 CT chest 1. Part solid 2.9 x 1.5 cm peripheral right upper lobe pulmonary nodule with tiny 0.2 cm solid component. Primary bronchogenic adenocarcinoma not excluded. Follow-up non-contrast CT recommended at 3-6 months to confirm persistence. If unchanged, and solid component remains <6 mm, annual CT is recommended until 5 years of stability has been established. If persistent these nodules should be considered highly suspicious if the solid component of the nodule is 6 mm or greater in size and enlarging. This recommendation follows the consensus statement: Guidelines for Management of Incidental Pulmonary Nodules Detected on CT Images: From the Fleischner Society 2017; Radiology 2017; 284:228-243. 2. Several additional (at least 10) solid pulmonary nodules scattered throughout both lungs, largest 0.7 cm, predominantly along the fissures or subpleural. Mild mediastinal and bilateral hilar adenopathy, nonspecific. This combination of findings could indicate sarcoidosis, although metastatic adenopathy from the right upper lobe nodule or lymphoproliferative disease cannot be excluded. Pulmonology consultation suggested. 3. Two-vessel coronary atherosclerosis. 4.  Aortic Atherosclerosis  (ICD10-I70.0).  03/01/23 MRI brain [I reviewed images myself and agree with interpretation. -VRP]  1. No acute intracranial abnormality. 2. Scattered and confluent foci of T2 hyperintensity within the white of the cerebral hemispheres, predominantly periventricular, and within the pons, nonspecific. 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 Differential diagnosis include demyelinating disease, vasculitis, and post inflammatory/infectious processes. 3. 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 4. Bilateral exophthalmos.   ASSESSMENT AND PLAN  73 y.o. year old male here  with:   Dx:  1. Weight loss   2. Weakness   3. 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  I spent 75 minutes of face-to-face and non-face-to-face time with patient.  This included previsit chart review, lab review, study review, order entry, electronic health record documentation, patient education.     Suanne Marker, MD 05/09/2023, 11:43 AM Certified in Neurology, Neurophysiology and Neuroimaging  [facility] [address], [address] [place], [place] [address] [phone]

## 2023-05-14 LAB — VITAMIN B1: Thiamine: 77.5 nmol/L (ref 66.5–200.0)

## 2023-05-14 LAB — VITAMIN B12: Vitamin B-12: 707 pg/mL (ref 232–1245)

## 2023-05-15 ENCOUNTER — Telehealth: Payer: Self-pay | Admitting: Diagnostic Neuroimaging

## 2023-05-15 NOTE — Progress Notes (Signed)
 Lab results are good. -VRP

## 2023-05-15 NOTE — Telephone Encounter (Signed)
 The patient's daughter left a voice mail asking if there was any earlier EMG appointments than the one he has this Thursday, which there is not. She also asked for a call back because she has questions about if her dad's confusion and memory problems could be caused because of a brain infection. She asked for a call back at [phone]

## 2023-05-15 NOTE — Telephone Encounter (Signed)
 [employer] state NPR sent to [facility] [phone]

## 2023-05-15 NOTE — Telephone Encounter (Signed)
 Daughter Cherly [place]

## 2023-05-16 ENCOUNTER — Telehealth: Payer: Self-pay | Admitting: Diagnostic Neuroimaging

## 2023-05-16 NOTE — Telephone Encounter (Signed)
 Called the daughter back. There was no answer. Left a detailed message requesting her to call back with concerns or send a mychart message.   In [facility] office visit he states below.  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

## 2023-05-16 NOTE — Telephone Encounter (Signed)
 Pt's daughter reports that last week pt had a dentist appointment, it was confirmed that pt has a very bad mouth infection and pt has not been taking the medication, pt has had a lot of confusion and dentist has encouraged daughter to relay to Dr Marjory Lies to see if the bad in infection in pt's mouth could have anything to do with pt's recent confusion and agitation(daughter was reminded that the office is referral based and if this is something not related to the referral for which pt is currently being seen another will be needed, daughter stated she understands ). Please call.

## 2023-05-17 NOTE — Telephone Encounter (Signed)
 I called and spoke with patient daughter Midge Aver and she mainly wanted the information documented in chart. Pt is scheduled for EMG appointment on 05/18/23.   Daughter had no further questions.

## 2023-05-17 NOTE — Telephone Encounter (Signed)
 Spoke with patient daughter and informed her the below as well. Daughter states brain infection was not a concern. It a mouth infection as noted on telephone encounter on 05/16/23 call.

## 2023-05-18 ENCOUNTER — Ambulatory Visit (INDEPENDENT_AMBULATORY_CARE_PROVIDER_SITE_OTHER): Payer: 59 | Admitting: Diagnostic Neuroimaging

## 2023-05-18 ENCOUNTER — Telehealth: Payer: Self-pay | Admitting: Neurology

## 2023-05-18 DIAGNOSIS — R292 Abnormal reflex: Secondary | ICD-10-CM

## 2023-05-18 DIAGNOSIS — R531 Weakness: Secondary | ICD-10-CM

## 2023-05-18 DIAGNOSIS — R634 Abnormal weight loss: Secondary | ICD-10-CM

## 2023-05-18 DIAGNOSIS — Z0289 Encounter for other administrative examinations: Secondary | ICD-10-CM

## 2023-05-18 NOTE — Progress Notes (Signed)
 EMG.NCS shows axonal, sensory predominant, and motor, length dependent polyneuropathy. Needle EMG shows minimal areas of denervation and rare fasciculations.   Differential diagnosis still includes motor neuron disease, but also need to rule out lung cancer / paraneoplastic dz and spinal cord issues (myelopathy). Will also check other neuromuscular labs today.  Orders Placed This Encounter  Procedures   CK   Aldolase   Autoimmune Neurology Ab   AChR Abs with Reflex to MuSK   TSH+T3+ThyAbs+TPO Ab+TRAb+T...   CBC with Differential/Platelet   Comprehensive metabolic panel   Hemoglobin A1c   Suanne Marker, MD 05/18/2023, 2:13 PM Certified in Neurology, Neurophysiology and Neuroimaging  [facility] [address], [address] [place], [place] [address] [phone]

## 2023-05-18 NOTE — Telephone Encounter (Signed)
 Pt was here today for NCV/EMG and Dr Marjory Lies wanted the pt to get blood work completed. Pt states that he is unable to give blood today. His wife and daughter were with him trying to convince him to get it done while he was here but pt refused stating he will come back.

## 2023-05-22 ENCOUNTER — Other Ambulatory Visit (INDEPENDENT_AMBULATORY_CARE_PROVIDER_SITE_OTHER): Payer: Self-pay

## 2023-05-22 DIAGNOSIS — Z0289 Encounter for other administrative examinations: Secondary | ICD-10-CM

## 2023-05-22 NOTE — Procedures (Signed)
 [facility]  NCS (NERVE CONDUCTION STUDY) WITH EMG (ELECTROMYOGRAPHY) REPORT   STUDY DATE: 05/18/23 PATIENT NAME: Gene Fletcher DOB: Aug 12, 1950 MRN: [id number]  ORDERING CLINICIAN: Joycelyn Schmid, MD   TECHNOLOGIST: Jenelle Mages ELECTROMYOGRAPHER: Glenford Bayley. Chase Knebel, MD  CLINICAL INFORMATION: 73 year old male with progressive weakness, weight loss, speech difficulty, cognitive difficulty, tremors.   FINDINGS: NERVE CONDUCTION STUDY:  Right median motor responses prolonged distal latency, normal amplitude, slow conduction velocity.  Right ulnar motor response has prolonged distal latency, decreased amplitude and slow conduction velocity.  Right peroneal and right tibial motor responses could not be obtained.  Right median and right ulnar sensory responses could not be obtained.  Right sural and right superficial peroneal sensory sponsors could not be obtained.  Right radial sensory response has prolonged peak latency and decreased amplitude.   NEEDLE ELECTROMYOGRAPHY:  Needle examination of right upper and right lower extremities notable for decreased motor unit recruitment in right first dorsal interosseous.  1+ positive sharp waves and fasciculations noted right gastrocnemius.  Rare fasciculations noted in right first dorsal interosseous.  Left cervical and right thoracic paraspinal muscles are normal.   IMPRESSION:   Abnormal study demonstrating: - Axonal, sensory predominant, and motor, length dependent polyneuropathy.     INTERPRETING PHYSICIAN:  Suanne Marker, MD Certified in Neurology, Neurophysiology and Neuroimaging  [facility] [address], [address] [place], [place] [address] [phone]  [facility]    Nerve / Sites Muscle Latency Ref. Amplitude Ref. Rel Amp Segments Distance Velocity Ref. Area    ms ms mV mV %  cm m/s m/s mVms  R Median - APB     Wrist APB 6.7 <=4.4 5.2 >=4.0 100 Wrist - APB 7   26.0      Upper arm APB 13.3  4.7  91.6 Upper arm - Wrist 26 40 >=49 25.6  R Ulnar - ADM     Wrist ADM 6.3 <=3.3 1.3 >=6.0 100 Wrist - ADM 7   7.6     B.Elbow ADM 9.1  1.1  82.9 B.Elbow - Wrist 11.6 41 >=49 6.3     A.Elbow ADM 15.1  1.6  147 A.Elbow - B.Elbow 23 38 >=49 11.2  R Peroneal - EDB     Ankle EDB NR <=6.5 NR >=2.0 NR Ankle - EDB 9   NR         Pop fossa - Ankle      R Tibial - AH     Ankle AH NR <=5.8 NR >=4.0 NR Ankle - AH 9   NR             SNC    Nerve / Sites Rec. Site Peak Lat Ref.  Amp Ref. Segments Distance    ms ms V V  cm  R Median - Digit II (Antidromic)     Wrist Dig II NR <=3.5 NR >=20 Wrist - Dig II 13  R Ulnar - Digit V (Antidromic)     Wrist Dig V NR <=3.1 NR >=17 Wrist - Dig V 11  R Radial - Anatomical snuff box (Forearm)     Forearm Wrist 3.4 <=2.9 12 >=15 Forearm - Wrist 10  R Sural - Ankle (Calf)     Calf Ankle NR <=4.4 NR >=6 Calf - Ankle 14  R Superficial peroneal - Ankle     Lat leg Ankle NR <=4.4 NR >=6 Lat leg - Ankle 14  R Median - Orthodromic (Dig II, Mid palm)  Dig II Wrist NR <=3.4 NR >=10 Dig II - Wrist 15                 F  Wave    Nerve F Lat Ref.   ms ms  R Ulnar - ADM 26.1 <=32.0       EMG Summary Table    Spontaneous MUAP Recruitment  Muscle IA Fib PSW Fasc Other Amp Dur. Poly Pattern  R. Vastus medialis Normal None None None _______ Normal Normal Normal Normal  R. Tibialis anterior Normal None None None _______ Normal Normal Normal Normal  R. Gastrocnemius (Medial head) Normal None 1+ Rare _______ Normal Normal Normal Normal  R. Deltoid Normal None None Rare _______ Normal Normal Normal Normal  R. Biceps brachii Normal None None None _______ Normal Normal Normal Normal  R. Triceps brachii Normal None None None _______ Normal Normal Normal Normal  R. Flexor carpi radialis Normal None None None _______ Normal Normal Normal Normal  R. First dorsal interosseous Normal None None Rare _______ Increased Normal Normal Reduced  L. Cervical  paraspinals Normal None None None _______ Normal Normal Normal Normal  R. Thoracic paraspinals (mid) Normal None None None _______ Normal Normal Normal Normal

## 2023-06-06 ENCOUNTER — Ambulatory Visit
Admission: RE | Admit: 2023-06-06 | Discharge: 2023-06-06 | Disposition: A | Discharge status: Home / Self Care | Payer: 59 | Source: Ambulatory Visit | Attending: Diagnostic Neuroimaging | Admitting: Diagnostic Neuroimaging

## 2023-06-06 DIAGNOSIS — R292 Abnormal reflex: Secondary | ICD-10-CM

## 2023-06-06 DIAGNOSIS — R531 Weakness: Secondary | ICD-10-CM

## 2023-06-06 DIAGNOSIS — R634 Abnormal weight loss: Secondary | ICD-10-CM | POA: Diagnosis not present

## 2023-06-06 MED ORDER — GADOPICLENOL 0.5 MMOL/ML IV SOLN
7.5000 mL | Freq: Once | INTRAVENOUS | Status: AC | PRN
  Administered 2023-06-06: 6 mL via INTRAVENOUS

## 2023-06-09 NOTE — Progress Notes (Signed)
 Results are good, no major findings. Continue current plan. -VRP

## 2023-06-12 ENCOUNTER — Encounter: Payer: Self-pay | Admitting: Pulmonary Disease

## 2023-06-14 ENCOUNTER — Other Ambulatory Visit (HOSPITAL_COMMUNITY): Payer: Self-pay | Admitting: Pulmonary Disease

## 2023-06-14 DIAGNOSIS — R634 Abnormal weight loss: Secondary | ICD-10-CM

## 2023-06-14 DIAGNOSIS — R911 Solitary pulmonary nodule: Secondary | ICD-10-CM

## 2023-06-17 LAB — TSH+T3+THYABS+TPO AB+TRAB+T...
Anti-Thyroglobulin Antibodies: <1 [IU]/mL
Anti-Thyroid Peroxidase Ab: <9 [IU]/mL
Free T-3: 1.9 pg/mL — ABNORMAL LOW
Free T4 by Dialysis: 1.5 ng/dL
Reverse T3,  LCMS Endo Sci: 37.5 ng/dL — ABNORMAL HIGH
TSH Receptor Antibody (TBII): <0.3 U/L
TSH: 1.7 uU/mL
Triiodothyronine (T-3), Serum: 50 ng/dL — ABNORMAL LOW

## 2023-06-17 LAB — AUTOIMMUNE NEUROLOGY AB
AGNA-1: NEGATIVE
AMPA-R1 Antibody: NEGATIVE
AMPA-R2 Antibody: NEGATIVE
Amphiphysin Antibody: NEGATIVE
Anti-Hu Ab: NEGATIVE
Anti-Ri Ab: NEGATIVE
Anti-Yo Ab: NEGATIVE
Antineruonal nuclear Ab Type 3: NEGATIVE
Aquaporin 4 Antibody: NEGATIVE
CASPR2 Antibody,Cell-based IFA: NEGATIVE
CRMP-5 IgG: NEGATIVE
DNER Antibody: NEGATIVE
DPPX Antibody: NEGATIVE
GABA-B-R Antibody: NEGATIVE
GAD65 Antibody: NEGATIVE
ITPR1 Antibody: NEGATIVE
IgLON5 Antibody: NEGATIVE
Interpretation: NEGATIVE
LGI1 Antibody, Cell-based IFA: NEGATIVE
Ma2/Ta Antibody: NEGATIVE
NMDA-R Antibody: NEGATIVE
Purkinje Cell Cyto Ab Type 2: NEGATIVE
Purkinje Cell Cyto Ab Type Tr: NEGATIVE
VGCC Antibody: 5.7 pmol/L (ref 0.0–30.0)
Zic4 Antibody: NEGATIVE
mGluR1 Antibody: NEGATIVE

## 2023-06-17 LAB — ALDOLASE: Aldolase: 4.6 U/L (ref 3.3–10.3)

## 2023-06-17 LAB — ACHR ABS WITH REFLEX TO MUSK: AChR Binding Ab, Serum: <0.03 nmol/L (ref 0.00–0.24)

## 2023-06-17 LAB — CK: Total CK: 67 U/L (ref 41–331)

## 2023-06-17 LAB — MUSK ANTIBODIES: MuSK Antibodies: <1 U/mL

## 2023-06-21 ENCOUNTER — Encounter (HOSPITAL_COMMUNITY)

## 2023-06-26 ENCOUNTER — Encounter (HOSPITAL_COMMUNITY): Payer: Self-pay

## 2023-06-26 ENCOUNTER — Ambulatory Visit (HOSPITAL_COMMUNITY)
Admission: RE | Admit: 2023-06-26 | Discharge: 2023-06-26 | Disposition: A | Discharge status: Home / Self Care | Source: Ambulatory Visit | Attending: Pulmonary Disease | Admitting: Pulmonary Disease

## 2023-06-26 DIAGNOSIS — R911 Solitary pulmonary nodule: Secondary | ICD-10-CM | POA: Insufficient documentation

## 2023-06-26 DIAGNOSIS — R634 Abnormal weight loss: Secondary | ICD-10-CM | POA: Insufficient documentation

## 2023-06-26 LAB — GLUCOSE, CAPILLARY
Glucose-Capillary: 521 mg/dL (ref 70–99)
Glucose-Capillary: 523 mg/dL (ref 70–99)

## 2023-06-26 MED ORDER — FLUDEOXYGLUCOSE F - 18 (FDG) INJECTION: 6.8000 | Freq: Once | INTRAVENOUS | Status: DC

## 2023-06-30 ENCOUNTER — Encounter: Payer: Self-pay | Admitting: Diagnostic Neuroimaging

## 2023-07-01 IMAGING — CT CT HEAD W/O CM
4 series · 16 of 47 positions shown, 18 images · non-contrast
Comparison: No pertinent prior exams available for comparison.

CLINICAL DATA: Provided history: Headache, new or worsening.



[Series 2: head wo · axial · 0.42mm/px · z∈[+966,+1081]mm · 7 of 31 slices shown, 9 images]
[im 4/31  brain]
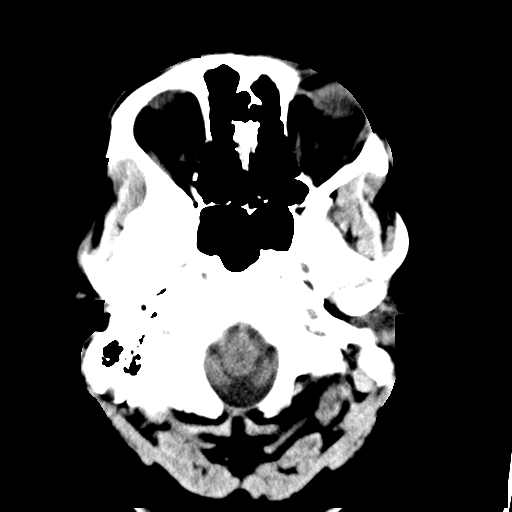
[im 4/31  bone]
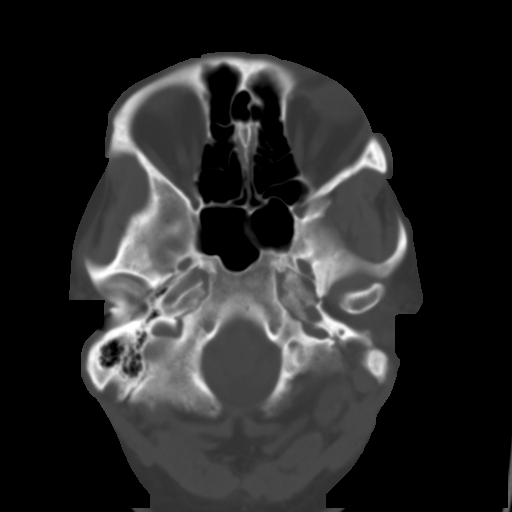
[im 8/31  brain]
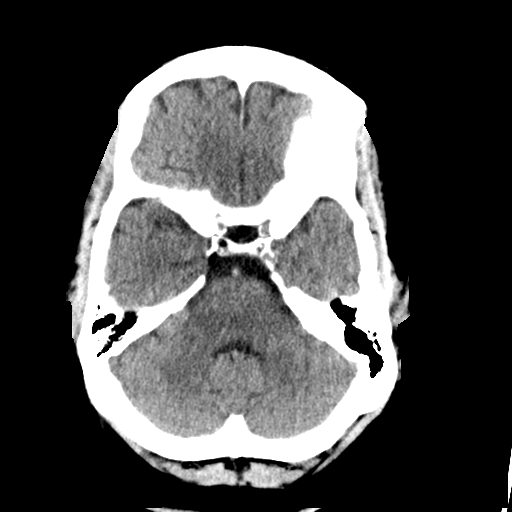
[im 12/31  brain]
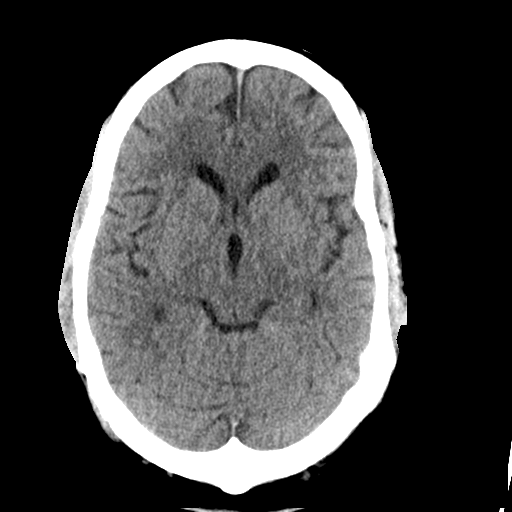
[im 16/31  brain]
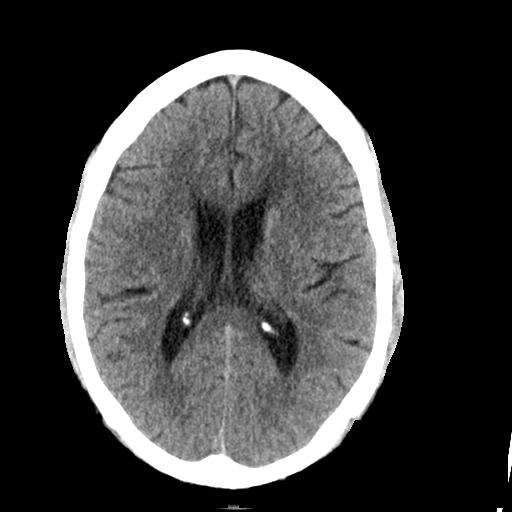
[im 19/31  brain]
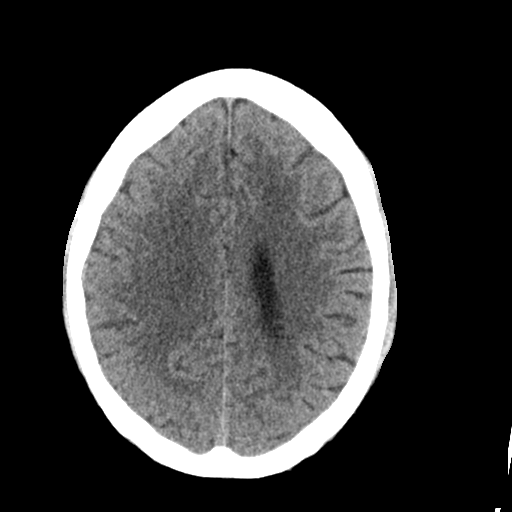
[im 19/31  bone]
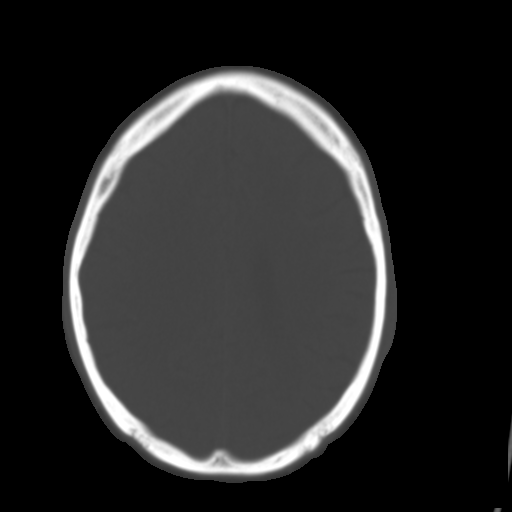
[im 23/31  brain]
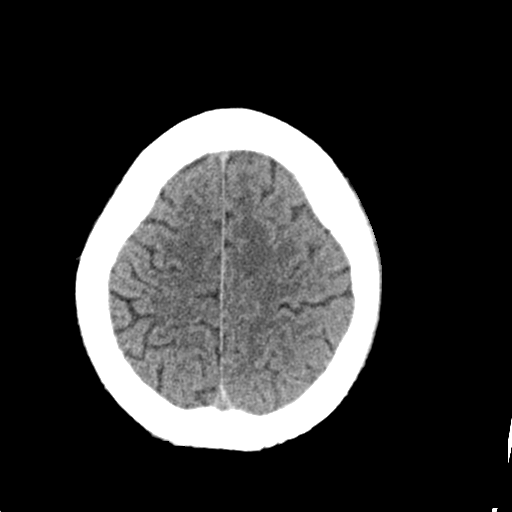
[im 27/31  brain]
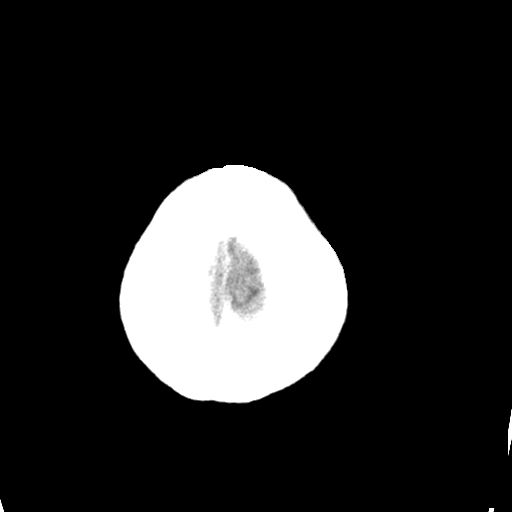

[Series 3: head bone · axial · 0.42mm/px · z∈[+965,+995]mm · 3 of 77 slices shown]
[im 8/77  bone]
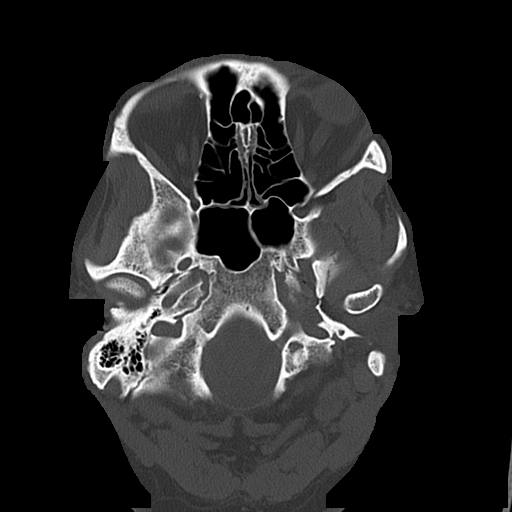
[im 16/77  bone]
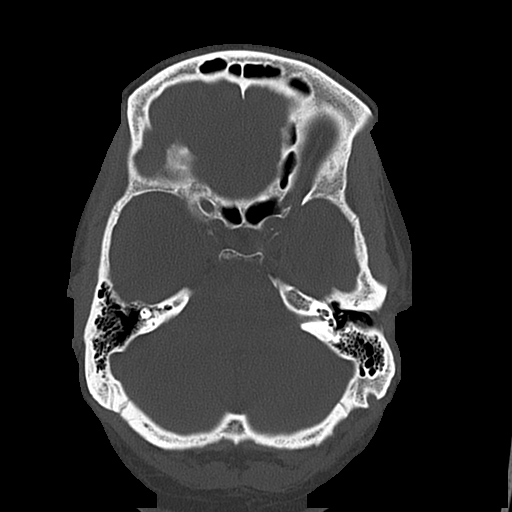
[im 23/77  bone]
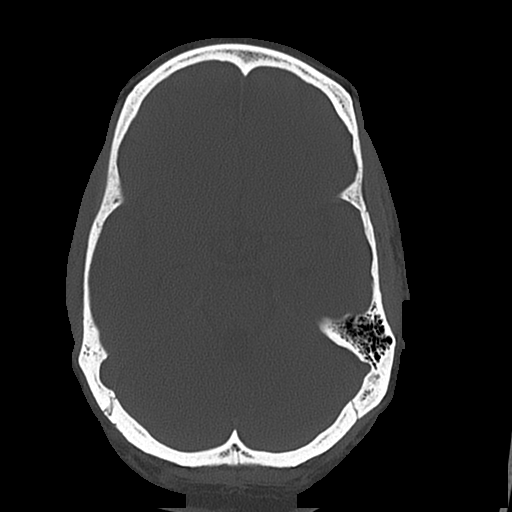

[Series 4: coronal soft · coronal · 0.32mm/px · 3 of 70 slices shown]
[im 24/70  brain]
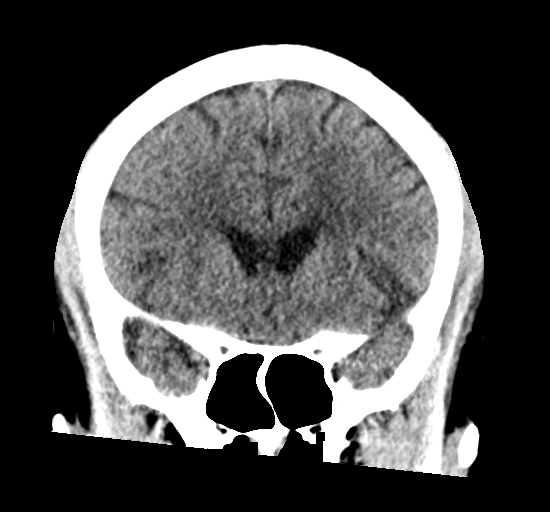
[im 31/70  brain]
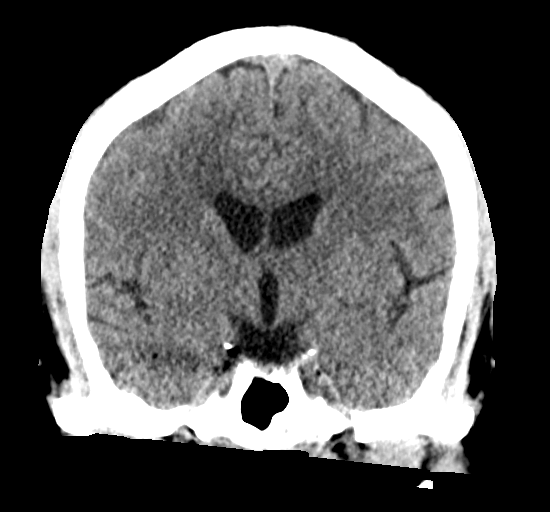
[im 39/70  brain]
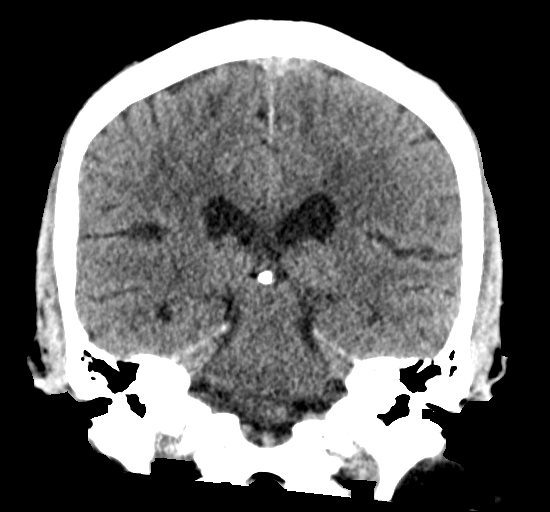

[Series 5: sagittal soft · sagittal · 0.32mm/px · 3 of 58 slices shown]
[im 20/58  brain]
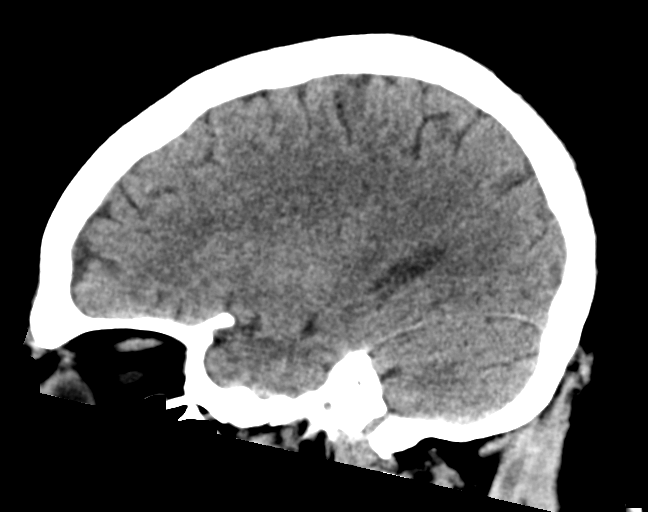
[im 29/58  brain]
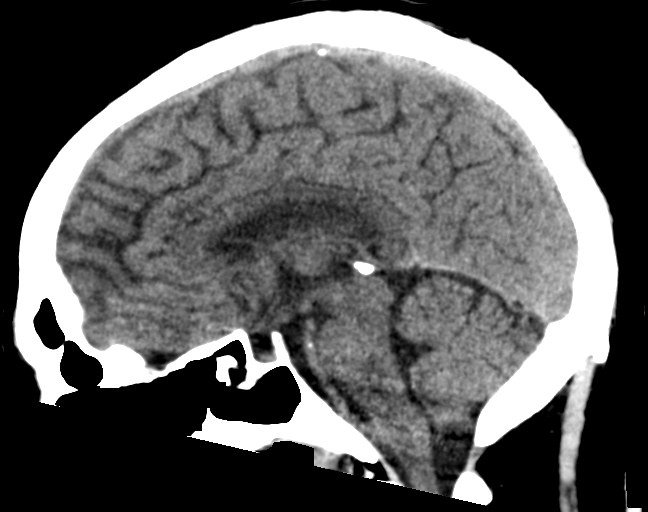
[im 39/58  brain]
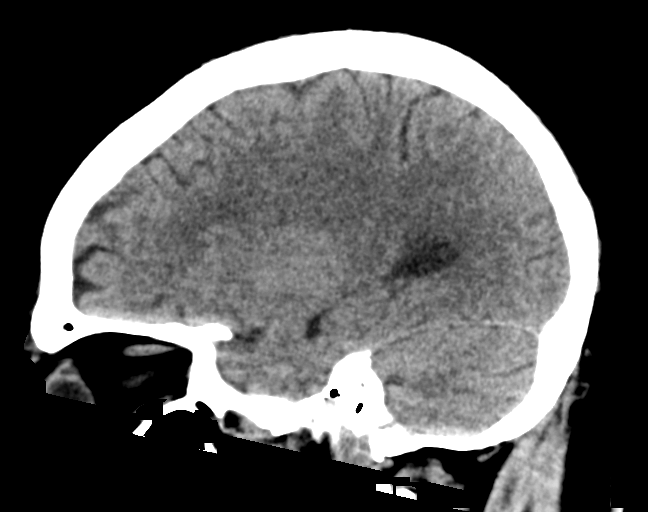

[16 of 47 positions shown; findings below may reference images not displayed]

FINDINGS: Brain:

Cerebral volume is normal.

There is no acute intracranial hemorrhage.

No demarcated cortical infarct.

No extra-axial fluid collection.

No evidence of an intracranial mass.

No midline shift.

Vascular: No hyperdense vessel. Atherosclerotic calcifications.

Skull: No fracture or aggressive osseous lesion.

Sinuses/Orbits: Bilateral proptosis. No significant paranasal sinus
disease at the imaged levels.
IMPRESSION: No evidence of acute intracranial abnormality.

Bilateral proptosis.

## 2023-07-03 ENCOUNTER — Ambulatory Visit (HOSPITAL_COMMUNITY)
Admission: RE | Admit: 2023-07-03 | Discharge: 2023-07-03 | Disposition: A | Discharge status: Home / Self Care | Source: Ambulatory Visit | Attending: Pulmonary Disease | Admitting: Pulmonary Disease

## 2023-07-03 DIAGNOSIS — R935 Abnormal findings on diagnostic imaging of other abdominal regions, including retroperitoneum: Secondary | ICD-10-CM | POA: Diagnosis not present

## 2023-07-03 DIAGNOSIS — R634 Abnormal weight loss: Secondary | ICD-10-CM | POA: Diagnosis not present

## 2023-07-03 DIAGNOSIS — R911 Solitary pulmonary nodule: Secondary | ICD-10-CM | POA: Diagnosis present

## 2023-07-03 DIAGNOSIS — Z91199 Patient's noncompliance with other medical treatment and regimen due to unspecified reason: Secondary | ICD-10-CM | POA: Insufficient documentation

## 2023-07-03 DIAGNOSIS — R918 Other nonspecific abnormal finding of lung field: Secondary | ICD-10-CM | POA: Insufficient documentation

## 2023-07-03 LAB — GLUCOSE, CAPILLARY: Glucose-Capillary: 108 mg/dL — ABNORMAL HIGH (ref 70–99)

## 2023-07-03 MED ORDER — FLUDEOXYGLUCOSE F - 18 (FDG) INJECTION
6.8300 | Freq: Once | INTRAVENOUS | Status: AC
  Administered 2023-07-03: 6.83 via INTRAVENOUS

## 2023-07-13 ENCOUNTER — Encounter: Payer: Self-pay | Admitting: Oncology

## 2023-07-17 ENCOUNTER — Encounter: Payer: Self-pay | Admitting: Oncology

## 2023-07-17 ENCOUNTER — Inpatient Hospital Stay (HOSPITAL_BASED_OUTPATIENT_CLINIC_OR_DEPARTMENT_OTHER): Admitting: Physician Assistant

## 2023-07-17 ENCOUNTER — Encounter: Payer: Self-pay | Admitting: Physician Assistant

## 2023-07-17 ENCOUNTER — Inpatient Hospital Stay: Attending: Physician Assistant

## 2023-07-17 VITALS — BP 125/61 | HR 69 | Temp 97.5°F | Resp 17 | Ht 71.0 in | Wt 153.7 lb

## 2023-07-17 DIAGNOSIS — J3489 Other specified disorders of nose and nasal sinuses: Secondary | ICD-10-CM | POA: Diagnosis not present

## 2023-07-17 DIAGNOSIS — Z79899 Other long term (current) drug therapy: Secondary | ICD-10-CM

## 2023-07-17 DIAGNOSIS — R627 Adult failure to thrive: Secondary | ICD-10-CM

## 2023-07-17 DIAGNOSIS — E785 Hyperlipidemia, unspecified: Secondary | ICD-10-CM | POA: Insufficient documentation

## 2023-07-17 DIAGNOSIS — I10 Essential (primary) hypertension: Secondary | ICD-10-CM | POA: Insufficient documentation

## 2023-07-17 DIAGNOSIS — Z833 Family history of diabetes mellitus: Secondary | ICD-10-CM

## 2023-07-17 DIAGNOSIS — R911 Solitary pulmonary nodule: Secondary | ICD-10-CM | POA: Insufficient documentation

## 2023-07-17 DIAGNOSIS — R591 Generalized enlarged lymph nodes: Secondary | ICD-10-CM | POA: Diagnosis not present

## 2023-07-17 DIAGNOSIS — Z801 Family history of malignant neoplasm of trachea, bronchus and lung: Secondary | ICD-10-CM | POA: Diagnosis not present

## 2023-07-17 DIAGNOSIS — E119 Type 2 diabetes mellitus without complications: Secondary | ICD-10-CM

## 2023-07-17 LAB — CMP (CANCER CENTER ONLY)
ALT: 21 U/L (ref 0–44)
AST: 17 U/L (ref 15–41)
Albumin: 3.6 g/dL (ref 3.5–5.0)
Alkaline Phosphatase: 143 U/L — ABNORMAL HIGH (ref 38–126)
Anion gap: 5 (ref 5–15)
BUN: 56 mg/dL — ABNORMAL HIGH (ref 8–23)
CO2: 26 mmol/L (ref 22–32)
Calcium: 8.7 mg/dL — ABNORMAL LOW (ref 8.9–10.3)
Chloride: 105 mmol/L (ref 98–111)
Creatinine: 2.35 mg/dL — ABNORMAL HIGH (ref 0.61–1.24)
GFR, Estimated: 29 mL/min — ABNORMAL LOW (ref >60)
Glucose, Bld: 226 mg/dL — ABNORMAL HIGH (ref 70–99)
Potassium: 4.8 mmol/L (ref 3.5–5.1)
Sodium: 136 mmol/L (ref 135–145)
Total Bilirubin: 0.4 mg/dL (ref 0.0–1.2)
Total Protein: 6.4 g/dL — ABNORMAL LOW (ref 6.5–8.1)

## 2023-07-17 LAB — CBC WITH DIFFERENTIAL (CANCER CENTER ONLY)
Abs Immature Granulocytes: 0.04 10*3/uL (ref 0.00–0.07)
Basophils Absolute: 0.1 10*3/uL (ref 0.0–0.1)
Basophils Relative: 1 %
Eosinophils Absolute: 0.1 10*3/uL (ref 0.0–0.5)
Eosinophils Relative: 1 %
HCT: 23.9 % — ABNORMAL LOW (ref 39.0–52.0)
Hemoglobin: 8.1 g/dL — ABNORMAL LOW (ref 13.0–17.0)
Immature Granulocytes: 1 %
Lymphocytes Relative: 9 %
Lymphs Abs: 0.5 10*3/uL — ABNORMAL LOW (ref 0.7–4.0)
MCH: 34.2 pg — ABNORMAL HIGH (ref 26.0–34.0)
MCHC: 33.9 g/dL (ref 30.0–36.0)
MCV: 100.8 fL — ABNORMAL HIGH (ref 80.0–100.0)
Monocytes Absolute: 0.5 10*3/uL (ref 0.1–1.0)
Monocytes Relative: 8 %
Neutro Abs: 4.3 10*3/uL (ref 1.7–7.7)
Neutrophils Relative %: 80 %
Platelet Count: 263 10*3/uL (ref 150–400)
RBC: 2.37 MIL/uL — ABNORMAL LOW (ref 4.22–5.81)
RDW: 14 % (ref 11.5–15.5)
WBC Count: 5.4 10*3/uL (ref 4.0–10.5)
nRBC: 0 % (ref 0.0–0.2)

## 2023-07-17 LAB — LACTATE DEHYDROGENASE: LDH: 184 U/L (ref 98–192)

## 2023-07-17 LAB — C-REACTIVE PROTEIN: CRP: 0.6 mg/dL (ref <1.0)

## 2023-07-17 LAB — SEDIMENTATION RATE: Sed Rate: 34 mm/h — ABNORMAL HIGH (ref 0–16)

## 2023-07-17 NOTE — Progress Notes (Signed)
 Late Entry from 07/14/2023 @ 1430  Referral Review:  Patient referred by Dr. Dava Erichsen [facility].  Brief HPI:  On chart review, Dr. Elverna Hamman was referred to see Gene Fletcher by his [facility] PCP last year.  Initial OV was 03/06/2023, where the decision was to re-eval after repeat scan in 3 months - which was completed and followed by a PET Scan which prompted the oncology referral.   Notably the patient was advised he needed a biopsy, according to [facility] notes from Dr. Elverna Hamman.  Chart review of same office note revealed patient not wanting anesthesia - nothing in the notes as to why or what the concern/fear was that may can be addressed.  I also saw that Dr. Samson Croak 06/09/2023 note said if the PET was positive he would refer the patient to Rad Onc for SBRT; however, the faxed referral in [place] is for Med Onc eval.  Making the call out for awareness of what was discussed with the patient ahead of his visit with you on 4/28 @ 10.   Addt'l Referrals Placed:  No Addt'l Imaging or Procedures Placed:  No   Imaging Supporting Referral:   02/24/2023 CT CT Chest WO ([facility])   06/05/2023 CT Chest WO ([facility] Only) IMPRESSION:  No outside images available at the time of dictation;  1.  2.9 x 1.5 cm part solid right upper lobe nodule with solid component measuring up to 6 mm remains concerning for adenocarcinoma spectrum lesion. Consider evaluation with PET/CT and/or tissue sampling as clinically appropriate.  2.  Bilateral scattered pulmonary nodules with mediastinal and hilar adenopathy. Differential considerations include sarcoidosis versus less likely metastatic involvement.    07/03/2023 PET IMPRESSION: Numerous hypermetabolic nodes identified throughout the mediastinum, hilum and upper retroperitoneum. Retrocrural and paraspinal areas are identified. There are 2 small neck lymph nodes which are suspicious as well. Findings worrisome for malignancy such as lymph node metastases  versus lymphoma.   Age Related Cancer Screenings:   Colonoscopy or Cologuard (M/F > 45):  01/04/2023 Colonoscopy PSA (M > 45):  Not found on chart review

## 2023-07-17 NOTE — Progress Notes (Unsigned)
 [facility] [phone]   [phone]  INITIAL CONSULTATION:  Patient Care Team: Barnetta Liberty, MD as PCP - General (Internal Medicine)  CHIEF COMPLAINTS/PURPOSE OF CONSULTATION:  -Lymphadenopathy -Lung nodules  ONCOLOGIC HISTORY: December 2024: Presented with anemia, unintentional weight loss and failure to thrive over several weeks 02/24/2023: CT chest: Part solid 2.9 x 1.5 cm peripheral right upper lobe pulmonary nodule with tiny 0.2 cm solid component.Several additional (at least 10) solid pulmonary nodules scattered throughout both lungs, largest 0.7 cm, predominantly along the fissures or subpleural. Mild mediastinal and bilateral hilar adenopathy, nonspecific. This combination of findings could indicate sarcoidosis, although metastatic adenopathy from the right upper lobe nodule or lymphoproliferative disease cannot be excluded. 03/01/2023: MRI Brain: No acute intracranial abnormality 07/03/2023: PET/CT scan: Numerous hypermetabolic nodes identified throughout the mediastinum, hilum and upper retroperitoneum. Retrocrural and paraspinal areas are identified. There are 2 small neck lymph nodes which are suspicious as well.There are small lung nodules. These also show significant uptake for size and would be worrisome for additional areas of disease. 07/17/2023: Establish care with [facility].   HISTORY OF PRESENTING ILLNESS:  Gene Fletcher 73 y.o. male with medical history significant for T2DM, hyperlipidemia and hypertension presents to the rapid diagnostic clinic for evaluation of lymphadenopathy and lung nodules. He is accompanied by his wife and daughter for this visit.   On exam today, Mr. Sekel reports that he underwent wisdom tooth extraction in December 2024 which caused his decreased PO intake and weight loss. His appetite and weight have improved recently, gainined close to 15 lbs since  February 2025. He is otherwise at his baseline health. He denies nausea, vomiting or bowel habit changes. He denies easy bruising or signs of bleeding. He denies any palpable lumps or masses. He denies fevers, chills, shortness of breath, chest pain, cough, headaches or dizziness.He has no other complaints. Rest of the ROS is below.    MEDICAL HISTORY:  Past Medical History:  Diagnosis Date   Arthritis    Diabetes mellitus without complication (HCC)    Hyperlipidemia    Hypertension     SURGICAL HISTORY: Past Surgical History:  Procedure Laterality Date   COLONOSCOPY      SOCIAL HISTORY: Social History   Socioeconomic History   Marital status: Married    Spouse name: Not on file   Number of children: 1   Years of education: Not on file   Highest education level: Not on file  Occupational History   Not on file  Tobacco Use   Smoking status: Never   Smokeless tobacco: Never  Vaping Use   Vaping status: Never Used  Substance and Sexual Activity   Alcohol  use: Not Currently    Alcohol /week: 2.0 standard drinks of alcohol     Types: 2 Standard drinks or equivalent per week   Drug use: No   Sexual activity: Not on file  Other Topics Concern   Not on file  Social History Narrative   Retired- [employer]      Right handed    Wear glasses    Drinks green tea daily    Social Drivers of Corporate investment banker Strain: Not on file  Food Insecurity: Not on file  Transportation Needs: Not on file  Physical Activity: Not on file  Stress: Not on file  Social Connections: Not on file  Intimate Partner Violence: Not on file    FAMILY HISTORY: Family History  Problem  Relation Age of Onset   Diabetes Mother    Diabetes Father    Lung cancer Father        smoker   Diabetes Sister    Diabetes Brother    Colon polyps Neg Hx    Colon cancer Neg Hx    Rectal cancer Neg Hx    Stomach cancer Neg Hx    Esophageal cancer Neg Hx     ALLERGIES:  is allergic to flexeril   [cyclobenzaprine ].  MEDICATIONS:  Current Outpatient Medications  Medication Sig Dispense Refill   amLODipine (NORVASC) 5 MG tablet Take 5 mg by mouth daily.     Blood Glucose Monitoring Suppl (ACCU-CHEK GUIDE) w/Device KIT      dextromethorphan  15 MG/5ML syrup Take 10 mLs (30 mg total) by mouth 4 (four) times daily as needed for cough. 120 mL 0   insulin aspart protamine- aspart (NOVOLOG MIX 70/30) (70-30) 100 UNIT/ML injection Inject 20 Units into the skin daily with breakfast.     Insulin Pen Needle (PEN NEEDLES) 32G X 4 MM MISC      ACCU-CHEK GUIDE test strip Check blood sugar q daily Dx-E11.65 In Vitro as directed for 90 days (Patient not taking: Reported on 05/09/2023)     cyclobenzaprine  (FLEXERIL ) 10 MG tablet Take 0.5-1 tablets (5-10 mg total) by mouth 3 (three) times daily as needed for muscle spasms. (Patient not taking: Reported on 12/16/2020) 30 tablet 0   glipiZIDE (GLUCOTROL XL) 10 MG 24 hr tablet Take 10 mg by mouth daily. (Patient not taking: Reported on 12/16/2020)     lisinopril-hydrochlorothiazide (PRINZIDE,ZESTORETIC) 10-12.5 MG tablet Take 1 tablet by mouth daily. (Patient not taking: Reported on 07/12/2022)     metFORMIN (GLUCOPHAGE) 1000 MG tablet Take 1,000 mg by mouth 2 (two) times daily with a meal. (Patient not taking: Reported on 07/17/2023)     Pseudoeph-Doxylamine-DM-APAP (NYQUIL PO) Take 1 Dose by mouth daily as needed (cold symptoms). (Patient not taking: Reported on 12/16/2020)     sitaGLIPtin-metformin (JANUMET) 50-1000 MG tablet Take 1 tablet by mouth 2 (two) times daily with a meal. (Patient not taking: Reported on 12/16/2020)     No current facility-administered medications for this visit.    REVIEW OF SYSTEMS:   Constitutional: ( - ) fevers, ( - )  chills , ( - ) night sweats Eyes: ( - ) blurriness of vision, ( - ) double vision, ( - ) watery eyes Ears, nose, mouth, throat, and face: ( - ) mucositis, ( - ) sore throat Respiratory: ( - ) cough, ( - ) dyspnea, (  - ) wheezes Cardiovascular: ( - ) palpitation, ( - ) chest discomfort, ( - ) lower extremity swelling Gastrointestinal:  ( - ) nausea, ( - ) heartburn, ( - ) change in bowel habits Skin: ( - ) abnormal skin rashes Lymphatics: ( - ) new lymphadenopathy, ( - ) easy bruising Neurological: ( - ) numbness, ( - ) tingling, ( - ) new weaknesses Behavioral/Psych: ( - ) mood change, ( - ) new changes  All other systems were reviewed with the patient and are negative.  PHYSICAL EXAMINATION: ECOG PERFORMANCE STATUS: 1 - Symptomatic but completely ambulatory  Vitals:   07/17/23 1027  BP: 125/61  Pulse: 69  Resp: 17  Temp: (!) 97.5 F (36.4 C)  SpO2: 100%   Filed Weights   07/17/23 1027  Weight: 153 lb 11.2 oz (69.7 kg)    GENERAL: well appearing male in NAD  SKIN: skin  color, texture, turgor are normal, no rashes or significant lesions EYES: conjunctiva are pink and non-injected, sclera clear OROPHARYNX: no exudate, no erythema; lips, buccal mucosa, and tongue normal  NECK: supple, non-tender LYMPH:  no palpable lymphadenopathy in the cervical, axillary or supraclavicular lymph nodes.  LUNGS: clear to auscultation and percussion with normal breathing effort HEART: regular rate & rhythm and no murmurs. Bilateral lower extremity edema, starting mid calf.  Musculoskeletal: no cyanosis of digits and no clubbing  PSYCH: alert & oriented x 3, fluent speech NEURO: no focal motor/sensory deficits  LABORATORY DATA:  I have reviewed the data as listed    Latest Ref Rng & Units 07/17/2023   11:37 AM 07/20/2022    7:03 PM 03/19/2022    5:51 PM  CBC  WBC 4.0 - 10.5 K/uL 5.4  6.7  5.6   Hemoglobin 13.0 - 17.0 g/dL 8.1  9.1  46.9   Hematocrit 39.0 - 52.0 % 23.9  27.6  32.8   Platelets 150 - 400 K/uL 263  246  239        Latest Ref Rng & Units 07/17/2023   11:37 AM 07/20/2022    7:03 PM 03/19/2022    5:51 PM  CMP  Glucose 70 - 99 mg/dL 629  528  413   BUN 8 - 23 mg/dL 56  35  41    Creatinine 0.61 - 1.24 mg/dL 2.44  0.10  2.72   Sodium 135 - 145 mmol/L 136  134  140   Potassium 3.5 - 5.1 mmol/L 4.8  4.1  4.0   Chloride 98 - 111 mmol/L 105  104  109   CO2 22 - 32 mmol/L 26  19  18    Calcium 8.9 - 10.3 mg/dL 8.7  8.2  8.3   Total Protein 6.5 - 8.1 g/dL 6.4     Total Bilirubin 0.0 - 1.2 mg/dL 0.4     Alkaline Phos 38 - 126 U/L 143     AST 15 - 41 U/L 17     ALT 0 - 44 U/L 21        RADIOGRAPHIC STUDIES: I have personally reviewed the radiological images as listed and agreed with the findings in the report. NM PET Image Initial (PI) Skull Base To Thigh (F-18 FDG) Result Date: 07/05/2023 CLINICAL DATA:  Initial treatment strategy for lung nodule. Weight loss. EXAM: NUCLEAR MEDICINE PET SKULL BASE TO THIGH TECHNIQUE: 6.83 mCi F-18 FDG was injected intravenously. Full-ring PET imaging was performed from the skull base to thigh after the radiotracer. CT data was obtained and used for attenuation correction and anatomic localization. Fasting blood glucose: 108 mg/dl COMPARISON:  Chest CT without contrast 02/24/2023. Abdomen pelvis CT 06/23/2022 and older. FINDINGS: Mediastinal blood pool activity: SUV max 2.1 Liver activity: SUV max 2.9 NECK: Small focus of uptake along the lateral posterior aspect of the right neck with maximum SUV value of 2.7. A small area of nodular tissue in this location is seen. There is also a small focus of uptake with maximum SUV value of 3.6 corresponding to a small node submandibular region on the left on CT image 31 measuring short axis 5 mm. No other specific abnormal uptake in the neck including along lymph node change of the submandibular, posterior triangle or internal jugular region. Physiologic, near symmetric uptake seen of the visualized intracranial compartment otherwise. Incidental CT findings: Streak artifact related to the patient's dental hardware. Slight areas mucosal thickening along the maxillary paranasal sinuses. Mastoid  air cells are  grossly clear. Bilateral middle turbinate concha bullosa. The parotid glands, submandibular glands and thyroid  glands unremarkable. CHEST: Several at areas of abnormal uptake along lymph nodes in the mediastinum and hila bilaterally. Specific examples include a precarinal node on series 4, image 67 measuring 2.5 by 1.3 cm with maximum SUV value of 10.6. Previously this measured 13 x 24 mm. Nodes in the mediastinum include paratracheal on both sides, prevascular, subcarinal, right of the heart just above the diaphragm, right paraspinal lower thoracic spine, and right-sided thoracic inlet. Second example is aortopulmonary window on the left on series 4 image 67 measuring 13 by 17 mm. Previously this measured 12 by 16 mm. This has maximum SUV of 7.7. Bilateral hilar nodes are identified. Foci on the right have maximum SUV value of 9.2 and left 9.8. These are more difficult to measure for size on the noncontrast CT scan. There are numerous small lung nodules identified. Some of these do show uptake. For example small nodule right upper lobe anterior to the right hilum on image 70 measures 4 mm and has uptake of maximum SUV of 1.7, significant for nodule of this size. Focus more superiorly right upper lobe has maximum SUV value of 2.7 corresponding to a nodule on image 62 measuring 5 mm. Several other small foci as well. Incidental CT findings: Heart is nonenlarged. Coronary artery calcifications are seen. No significant pericardial effusion. Lungs are without consolidation, pneumothorax or effusion. ABDOMEN/PELVIS: There is physiologic distribution radiotracer along the parenchymal organs, bowel and renal collecting systems. Specifically no abnormal uptake along the spleen. Maximum splenic length of 10 cm. There are several abnormal hypermetabolic nodes identified including retroperitoneal and retrocrural. Particularly in the upper abdomen near the celiac, porta hepatis. Example node has maximum SUV value of 11.8 and is  seen on CT image 106, just anterior to the IVC measuring 18 by 14 mm. Previously this node would have measured 17 by 11 mm. Incidental CT findings: Diffuse colonic stool. No bowel obstruction. Significant stool towards the rectum. Gallbladder is present. The adrenal glands are preserved. No abnormal calcifications seen within either kidney nor along the expected course of either ureter. Underdistended urinary bladder with slight wall thickening. Grossly the pancreas, liver are preserved. Mild anasarca. Scattered vascular calcifications. SKELETON: No specific abnormal uptake along the visualized osseous structures. IMPRESSION: Numerous hypermetabolic nodes identified throughout the mediastinum, hilum and upper retroperitoneum. Retrocrural and paraspinal areas are identified. There are 2 small neck lymph nodes which are suspicious as well. Findings worrisome for malignancy such as lymph node metastases versus lymphoma. There are small lung nodules. These also show significant uptake for size and would be worrisome for additional areas of disease. Electronically Signed   By: Adrianna Horde M.D.   On: 07/05/2023 14:43    ASSESSMENT & PLAN AMNER KNOEDLER is a 73 y.o. male  who presents to the diagnostic clinic for evaluation of lymphadenopathy and lung nodules. We discussed possible etiologies including infectious process, inflammatory process and underlying malignancy. Patient will proceed with laboratory evaluation today and then will assess for percutaneous biopsy. Mr. Paules expressed understanding of the plan provided and would like to proceed with the recommended workup.   #Lymphadenopathy #Lung nodules: --Labs today to check CBC, CMP, LDH, flow cytometry, ESR, CRP, PSA level --No additional imaging is recommended.  --IR reviewed today with no target lesion amenable for percutaneous biopsy.  --Will discuss with patient if he is willing to undergo bronchoscopy to obtain biopsy. Patient is hesitant  to undergo general anesthesia.  --If patient declines bronchoscopy, we will need to monitor closely with repeat imaging in 3 months to determine if there is a new target for percutaneous biopsy.  Orders Placed This Encounter  Procedures   CBC with Differential (Cancer Center Only)    Standing Status:   Future    Number of Occurrences:   1    Expiration Date:   07/16/2024   CMP (Cancer Center only)    Standing Status:   Future    Number of Occurrences:   1    Expiration Date:   07/16/2024   Lactate dehydrogenase (LDH)    Standing Status:   Future    Number of Occurrences:   1    Expiration Date:   07/16/2024   Flow Cytometry, Peripheral Blood (Oncology)    Standing Status:   Future    Number of Occurrences:   1    Expiration Date:   07/16/2024   Sedimentation rate    Standing Status:   Future    Number of Occurrences:   1    Expiration Date:   07/16/2024   C-reactive protein    Standing Status:   Future    Number of Occurrences:   1    Expiration Date:   07/16/2024   Prostate-Specific AG, Serum    Standing Status:   Future    Number of Occurrences:   1    Expiration Date:   07/16/2024    All questions were answered. The patient knows to call the clinic with any problems, questions or concerns.  I have spent a total of {CHL ONC TIME VISIT - [id number]} minutes of face-to-face and non-face-to-face time, preparing to see the patient, obtaining and/or reviewing separately obtained history, performing a medically appropriate examination, counseling and educating the patient, ordering medications/tests/procedures, referring and communicating with other health care professionals, documenting clinical information in the electronic health record, independently interpreting results and communicating results to the patient, and care coordination.   Wyline Hearing, PA-C Department of Hematology/Oncology [facility] Phone: [phone]

## 2023-07-18 LAB — SURGICAL PATHOLOGY

## 2023-07-18 LAB — PROSTATE-SPECIFIC AG, SERUM (LABCORP): Prostate Specific Ag, Serum: 1.6 ng/mL (ref 0.0–4.0)

## 2023-07-19 ENCOUNTER — Telehealth: Payer: Self-pay | Admitting: Physician Assistant

## 2023-07-19 DIAGNOSIS — D539 Nutritional anemia, unspecified: Secondary | ICD-10-CM

## 2023-07-19 DIAGNOSIS — R591 Generalized enlarged lymph nodes: Secondary | ICD-10-CM

## 2023-07-19 LAB — FLOW CYTOMETRY

## 2023-07-19 NOTE — Telephone Encounter (Signed)
 I called Gene Fletcher to review lab results from 07/17/2023 and discuss recommendations for biopsy. His labs revealed macrocytic anemia with Hgb 8.1, MCV 100.8 which will require further workup. CMP revealed persistent CKD with creatinine of 2.35. PSA marker was normal and flow cytometry did not reveal monoclonal B-cell population.   IR team reviewed biopsy request and declined due to no target lesion amenable for percutaneous biopsy. We will set up a consultation with pulmonology to evaluate for bronchoscopy with biopsy.   Gene Fletcher has agreed to move forward with the biopsy even with general anesthesia. We will follow up once biopsy is obtained and pathology is ready for review.  Gene Fletcher expressed understanding of the plan provided.

## 2023-07-28 ENCOUNTER — Ambulatory Visit: Admitting: Acute Care

## 2023-07-28 ENCOUNTER — Telehealth: Payer: Self-pay

## 2023-07-28 ENCOUNTER — Encounter: Payer: Self-pay | Admitting: *Deleted

## 2023-07-28 NOTE — Telephone Encounter (Signed)
 Called pt to speak about his appointment that was missed,and he felt as though he does not need a bronchoscopy because he has been gaining weight despite recent scans being concerning for cancer,he agreed to speak with sarah about options and to answer any questions he may have on may 28th and that is scheduled and will send a letter to address to serve as a reminder

## 2023-07-31 ENCOUNTER — Telehealth: Payer: Self-pay | Admitting: Physician Assistant

## 2023-07-31 NOTE — Telephone Encounter (Signed)
 Gene Fletcher requested my call to share that his appetite has returned back to baseline and he continues to gain weight. He questioned the rationale for pursuing a bronchoscopy with biopsy. I reviewed the PET scan results which show FDG avid lymphadenopathy with one of the differentials that includes malignancy. I explained that the only way to rule out malignancy is with a tissue biopsy and unfortunately IR did not identify a target lesion amenable for percutaneous biopsy.   Gene Fletcher expressed understanding of the plan and plans to keep the rescheduled appt with pulmonology on 08/16/2023. I encouraged him to call back with any further questions or concerns.

## 2023-08-16 ENCOUNTER — Telehealth: Payer: Self-pay

## 2023-08-16 ENCOUNTER — Ambulatory Visit: Admitting: Acute Care

## 2023-08-16 NOTE — Telephone Encounter (Signed)
 Visit is scheduled for 08/22/23 with Isa Manuel.

## 2023-08-16 NOTE — Telephone Encounter (Signed)
 Called patient to see if they are coming to their appointment and they said no,pt stated he has begun to gain weight and has gained 26 pounds and doesn't think he needs a bronch,I told him to let his primary care know as well as let sarah call him on the telephone and will route to sarah she can call him at her earliest.

## 2023-08-18 ENCOUNTER — Other Ambulatory Visit (HOSPITAL_COMMUNITY): Payer: Self-pay

## 2023-08-22 ENCOUNTER — Encounter: Payer: Self-pay | Admitting: Acute Care

## 2023-08-22 ENCOUNTER — Ambulatory Visit: Admitting: Acute Care

## 2023-08-22 DIAGNOSIS — R591 Generalized enlarged lymph nodes: Secondary | ICD-10-CM | POA: Diagnosis not present

## 2023-08-22 DIAGNOSIS — R918 Other nonspecific abnormal finding of lung field: Secondary | ICD-10-CM | POA: Diagnosis not present

## 2023-08-22 NOTE — Patient Instructions (Signed)
 It is good to see you today. I have ordered a CT Chest to re-evaluate the pulmonary nodules of concern. You will get a call to get this scheduled. You will have a follow up video visit with me to review the results within a week after the scan to determine best options for care. Call if you have any questions or concerns.  Please contact office for sooner follow up if symptoms do not improve or worsen or seek emergency care

## 2023-08-22 NOTE — Progress Notes (Signed)
 Virtual Visit via Video Note  I connected with Gene Fletcher on 08/22/23 at  1:30 PM EDT by a video enabled telemedicine application and verified that I am speaking with the correct person using two identifiers.  Location: Patient:  At home Provider: [address] [place], [place], [address]    I discussed the limitations of evaluation and management by telemedicine and the availability of in person appointments. The patient expressed understanding and agreed to proceed.  Synopsis History of Present Illness: Pt. Referred by Dr. McQuaid for consideration of bronchoscopy with biopsies 07/28/2023 of concerning lung nodules. Patient was a no show. We have been trying to get him into the office for follow up, however patient has no showed twice. He states he does not want general anesthesia.  He agreed to a video  visit to discuss the option of bronchoscopy with biopsies.He did go to the rapid diagnostic oncology clinic on 07/19/2023. He has explained that he does not want to have a general anesthesia . He says he does not want to be a [place] pig for procedures. He feels as he has gained weight and he does not believe he has any cancer. He firmly believes  this is an infection. I explained that the only way to know for certain if this is a cancer is to get a tissue sample, which can only be done with bronchoscopy, as IR has deferred on the case as there is not a good target for percutaneous biopsy. I explained that I was concerned that if we did not do either follow up imaging, or a biopsy we would not be doing out due diligence to provide him with the best possible health care, and option of treatment.We discussed if this is a malignancy or cancer, which can only be determined with biopsy, and he does nothing, it can progress, and we will have fewer options for treatment.   As he will need another CT Chest for navigational bronch, ( last Ct was 02/2024) he has agreed to a super D CT Chest and  video follow up to re-evaluate need for biopsy. He is in agreement with this plan.  Order for super D has been placed.  My nurse who is developed a relationship with him by phone to make sure he comes to the appointments is going to follow-up and make sure he gets his video follow-up visit scheduled.  I do believe if we can demonstrate that these nodules have progressed or gotten larger the patient will agree to having bronchoscopy.  Of note he states he has had significant weight gain, and this  is 1 of the reasons that he believes that this is not cancer.    Observations/Objective: Imaging HISTORY: December 2024: Presented with anemia, unintentional weight loss and failure to thrive over several weeks 02/24/2023: CT chest: Part solid 2.9 x 1.5 cm peripheral right upper lobe pulmonary nodule with tiny 0.2 cm solid component.Several additional (at least 10) solid pulmonary nodules scattered throughout both lungs, largest 0.7 cm, predominantly along the fissures or subpleural. Mild mediastinal and bilateral hilar adenopathy, nonspecific. This combination of findings could indicate sarcoidosis, although metastatic adenopathy from the right upper lobe nodule or lymphoproliferative disease cannot be excluded. 03/01/2023: MRI Brain: No acute intracranial abnormality 07/03/2023: PET/CT scan: Numerous hypermetabolic nodes identified throughout the mediastinum, hilum and upper retroperitoneum. Retrocrural and paraspinal areas are identified. There are 2 small neck lymph nodes which are suspicious as well.There are small lung nodules. These also show significant  uptake for size and would be worrisome for additional areas of disease. 07/17/2023: Establish care with [facility].  Recommendation for bronchoscopy with biopsies   Assessment and Plan: Lung Nodules Lymphadenopathy  Does not want procedure/ biopsy involving general anesthesia IR have rejected option of percutaneous biopsy 2/2 no  safe/ adequate target.  Plan I have ordered a CT Chest to re-evaluate the pulmonary nodules of concern. You will get a call to get this scheduled. You will have a follow up video visit with me to review the results within a week after the scan to determine best options for care. Call if you have any questions or concerns.  Please contact office for sooner follow up if symptoms do not improve or worsen or seek emergency care    Follow Up Instructions: You will have a follow up video visit with me to review the results within a week after the scan to determine best options for care. Call if you have any questions or concerns.  Please contact office for sooner follow up if symptoms do not improve or worsen or seek emergency care     I discussed the assessment and treatment plan with the patient. The patient was provided an opportunity to ask questions and all were answered. The patient agreed with the plan and demonstrated an understanding of the instructions.   The patient was advised to call back or seek an in-person evaluation if the symptoms worsen or if the condition fails to improve as anticipated.   I spent 35 minutes dedicated to the care of this patient on the date of this encounter to include pre-visit review of records, video face-to-face time with the patient discussing conditions above, post visit ordering of testing, clinical documentation with the electronic health record, making appropriate referrals as documented, and communicating necessary information to the patient's healthcare team.   Raejean Bullock, NP

## 2023-08-31 ENCOUNTER — Ambulatory Visit (HOSPITAL_COMMUNITY)

## 2023-09-03 ENCOUNTER — Ambulatory Visit (HOSPITAL_BASED_OUTPATIENT_CLINIC_OR_DEPARTMENT_OTHER)

## 2023-09-08 ENCOUNTER — Ambulatory Visit: Admitting: Acute Care

## 2023-09-14 ENCOUNTER — Ambulatory Visit (HOSPITAL_COMMUNITY)

## 2023-09-14 ENCOUNTER — Ambulatory Visit (HOSPITAL_COMMUNITY)
Admission: RE | Admit: 2023-09-14 | Discharge: 2023-09-14 | Disposition: A | Discharge status: Home / Self Care | Source: Ambulatory Visit | Attending: Acute Care | Admitting: Acute Care

## 2023-09-14 DIAGNOSIS — R918 Other nonspecific abnormal finding of lung field: Secondary | ICD-10-CM | POA: Diagnosis present

## 2023-09-29 ENCOUNTER — Encounter: Payer: Self-pay | Admitting: Medical Oncology

## 2023-10-04 ENCOUNTER — Ambulatory Visit: Admitting: Acute Care

## 2023-10-04 ENCOUNTER — Ambulatory Visit (INDEPENDENT_AMBULATORY_CARE_PROVIDER_SITE_OTHER): Admitting: Acute Care

## 2023-10-04 ENCOUNTER — Encounter: Payer: Self-pay | Admitting: Emergency Medicine

## 2023-10-04 ENCOUNTER — Encounter: Payer: Self-pay | Admitting: Acute Care

## 2023-10-04 ENCOUNTER — Telehealth: Payer: Self-pay | Admitting: Acute Care

## 2023-10-04 DIAGNOSIS — R591 Generalized enlarged lymph nodes: Secondary | ICD-10-CM | POA: Diagnosis not present

## 2023-10-04 DIAGNOSIS — R599 Enlarged lymph nodes, unspecified: Secondary | ICD-10-CM

## 2023-10-04 DIAGNOSIS — R942 Abnormal results of pulmonary function studies: Secondary | ICD-10-CM

## 2023-10-04 DIAGNOSIS — R918 Other nonspecific abnormal finding of lung field: Secondary | ICD-10-CM | POA: Diagnosis not present

## 2023-10-04 NOTE — Progress Notes (Signed)
 Virtual Visit via Video Note  I connected with Gene Fletcher on 10/04/23 at 11:30 AM EDT by a video enabled telemedicine application and verified that I am speaking with the correct person using two identifiers.  Location: Patient: At home Provider: [address], [place], [place], [address]    I discussed the limitations of evaluation and management by telemedicine and the availability of in person appointments. The patient expressed understanding and agreed to proceed.  Patient was unsuccessful in logging in for the video visit by his phone.  I believe the phone was dead or not working properly.  There was no other phone in the house available to attempt a video visit therefore this visit was done as a telephone visit after attempting all options to make video work.  History of Present Illness:  Gene Fletcher is a 73 year old male never smoker  referred by Dr. McQuaid for consideration of bronchoscopy with biopsies 07/28/2023 of concerning lung nodules. He has no showed several times and most visits have been video based. We have recommended bronchoscopy with biopsy multiple times.  He did go to the rapid diagnostic oncology clinic through [facility] on 07/19/2023. He has explained that he does not want to have a general anesthesia . He says he does not want to be a [place] pig for procedures. He feels as he has gained weight and he does not believe he has any cancer. He firmly believes  this is an infection. I explained that the only way to know for certain if this is a cancer is to get a tissue sample, which can only be done with bronchoscopy, as IR has deferred on the case as there is not a good target for percutaneous biopsy. I explained that I was concerned that if we did not do either follow up imaging, or a biopsy we would not be doing out due diligence to provide him with the best possible health care, and option of treatment.We discussed if this is a malignancy or cancer, which can  only be determined with biopsy, and he does nothing, it can progress, and we will have fewer options for treatment.    As he will need another CT Chest for navigational bronch, ( last Ct was 02/2024) he has agreed to a super D CT Chest and video follow up to re-evaluate need for biopsy. He is in agreement with this plan.  Order for super D has been placed.  My nurse Wilford, who has developed a relationship with him by phone to make sure he comes to the appointments is going to follow-up and make sure he gets his video follow-up visit scheduled.   I do believe if we can demonstrate that these nodules have progressed or gotten larger the patient will agree to having bronchoscopy.   Of note he states he has had significant weight gain, and this  is 1 of the reasons that he believes that this is not cancer.  10/04/2023 Pt. Presents for  follow up. He states he has been doing well. He has gained 26-27 pounds. His appetite is much improved. Again, he feels this is an indication that these nodules we have been following are not cancer.   We have reviewed his most recent Ct Chest. 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 Two new ground-glass pulmonary nodules in the lower lobes, largest 0.9 cm in the  right lower lobe.  Again we discussed Gene Fletcher concerns regarding navigational bronchoscopy with biopsy.  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  I did however have a very frank discussion with him that I am concerned that this is cancer, interventional radiology did not consider him a good candidate for a biopsy that did not involve general anesthesia.  We again discussed the procedure involved in navigational bronchoscopy with biopsy.  We reviewed the risks involved including bleeding, infection, pneumothorax, and adverse reaction to anesthesia.  Patient's daughter is coming from  out of town next week and he was in agreement with doing this while she was in town.  We have taken this opportunity to get the patient scheduled for a navigational bronchoscopy with biopsies on 10/16/2023.  He understands that a letter will be mailed to his home with all of the information.  My nurse Louella has also gone over the information with him by phone this afternoon.  I am encouraged by the fact that the patient has finally agreed to move forward with biopsy.  He had no questions or concerns at completion of the video visit regarding the procedure.  He understands he will follow-up with me 1 week after the procedure to the [facility] the results of the biopsy.  Observations/Objective: CT Chest 09/22/2023 IMPRESSION: 1. Part solid 3.1 cm peripheral right upper lobe lung mass with 0.3 cm solid component, minimally increased in size from 02/24/2023 chest CT. Indolent primary bronchogenic adenocarcinoma to be excluded. 2. Two new ground-glass pulmonary nodules in the lower lobes, largest 0.9 cm in the right lower lobe. 3. Numerous (greater than 10) solid pulmonary nodules scattered throughout both lungs, largest 1.0 cm along the right major fissure in the anterior right lower lobe, all unchanged from 02/24/2023 chest CT. 4. Mild to moderate mediastinal lymphadenopathy is stable to slightly increased from 02/24/2023 chest CT. Mild bilateral hilar lymphadenopathy is poorly delineated on these noncontrast images and not appreciably changed. Differential includes metastatic adenopathy versus lymphoproliferative disorder versus sarcoidosis. 5. Three-vessel coronary atherosclerosis. 6.  Aortic Atherosclerosis (ICD10-I70.0).  Imaging HISTORY: December 2024: Presented with anemia, unintentional weight loss and failure to thrive over several weeks 02/24/2023: CT chest: Part solid 2.9 x 1.5 cm peripheral right upper lobe pulmonary nodule with tiny 0.2 cm solid component.Several additional (at least  10) solid pulmonary nodules scattered throughout both lungs, largest 0.7 cm, predominantly along the fissures or subpleural. Mild mediastinal and bilateral hilar adenopathy, nonspecific. This combination of findings could indicate sarcoidosis, although metastatic adenopathy from the right upper lobe nodule or lymphoproliferative disease cannot be excluded. 03/01/2023: MRI Brain: No acute intracranial abnormality 07/03/2023: PET/CT scan: Numerous hypermetabolic nodes identified throughout the mediastinum, hilum and upper retroperitoneum. Retrocrural and paraspinal areas are identified. There are 2 small neck lymph nodes which are suspicious as well.There are small lung nodules. These also show significant uptake for size and would be worrisome for additional areas of disease. 07/17/2023: Establish care with [facility].  Recommendation for bronchoscopy with biopsies    Assessment and Plan: Lung Nodules with interval growth Lymphadenopathy  IR have rejected option of percutaneous biopsy 2/2 no safe/ adequate target.  Plan We have gone over the results of your CT scan which show interval growth to the 3.1 cm right upper lobe lung mass, as well as 2 new lung nodules. These are the areas that did show increased metabolic activity on your PET scan, as well as several lymph nodes. You have agreed to move forward with  tissue sampling for biopsy. I have placed an order for a bronchoscopy with biopsies.  We have discussed the procedure in detail.  We have reviewed the risks and benefits of the procedure. These include bleeding, infection, puncture of the lung, and adverse reaction to anesthesia. You have agreed to proceed with biopsy to evaluate the right upper lobe nodule. Your procedure will be done by Dr. Lamar Chris. You will receive a letter today with date time and information pertaining to the procedure. You will need someone to drive you to the procedure, stay with you during the  procedure, and stay with you after the procedure. You will also need someone to stay with you for 24 hours after anesthesia to ensure you have cleared and are doing well. You will follow-up with me 1 week after the procedure to review the results and to ensure you are doing well. Call if you need us  prior to the procedure or if you have any questions at all. Please contact office for sooner follow up if symptoms do not improve or worsen or seek emergency care       Follow Up Instructions: Bronchoscopy with biopsies is scheduled for 10/16/2023 Follow-up with Gene Bad NP 1 week after biopsy to review results and ensure you are doing well after the procedure Call if you need us  before hand    I discussed the assessment and treatment plan with the patient. The patient was provided an opportunity to ask questions and all were answered. The patient agreed with the plan and demonstrated an understanding of the instructions.   The patient was advised to call back or seek an in-person evaluation if the symptoms worsen or if the condition fails to improve as anticipated.  I spent 25 minutes dedicated to the care of this patient on the date of this encounter to include pre-visit review of records, video face-to-face time with the patient discussing conditions above, post visit ordering of testing, clinical documentation with the electronic health record, making appropriate referrals as documented, and communicating necessary information to the patient's healthcare team.   Gene JULIANNA Lites, NP

## 2023-10-04 NOTE — H&P (View-Only) (Signed)
 Virtual Visit via Video Note  I connected with Gene Fletcher on 10/04/23 at 11:30 AM EDT by a video enabled telemedicine application and verified that I am speaking with the correct person using two identifiers.  Location: Patient: At home Provider: [address], [place], [place], [address]    I discussed the limitations of evaluation and management by telemedicine and the availability of in person appointments. The patient expressed understanding and agreed to proceed.  Patient was unsuccessful in logging in for the video visit by his phone.  I believe the phone was dead or not working properly.  There was no other phone in the house available to attempt a video visit therefore this visit was done as a telephone visit after attempting all options to make video work.  History of Present Illness:  Mr. Gene Fletcher is a 73 year old male never smoker  referred by Dr. McQuaid for consideration of bronchoscopy with biopsies 07/28/2023 of concerning lung nodules. He has no showed several times and most visits have been video based. We have recommended bronchoscopy with biopsy multiple times.  He did go to the rapid diagnostic oncology clinic through [facility] on 07/19/2023. He has explained that he does not want to have a general anesthesia . He says he does not want to be a [place] pig for procedures. He feels as he has gained weight and he does not believe he has any cancer. He firmly believes  this is an infection. I explained that the only way to know for certain if this is a cancer is to get a tissue sample, which can only be done with bronchoscopy, as IR has deferred on the case as there is not a good target for percutaneous biopsy. I explained that I was concerned that if we did not do either follow up imaging, or a biopsy we would not be doing out due diligence to provide him with the best possible health care, and option of treatment.We discussed if this is a malignancy or cancer, which can  only be determined with biopsy, and he does nothing, it can progress, and we will have fewer options for treatment.    As he will need another CT Chest for navigational bronch, ( last Ct was 02/2024) he has agreed to a super D CT Chest and video follow up to re-evaluate need for biopsy. He is in agreement with this plan.  Order for super D has been placed.  My nurse Wilford, who has developed a relationship with him by phone to make sure he comes to the appointments is going to follow-up and make sure he gets his video follow-up visit scheduled.   I do believe if we can demonstrate that these nodules have progressed or gotten larger the patient will agree to having bronchoscopy.   Of note he states he has had significant weight gain, and this  is 1 of the reasons that he believes that this is not cancer.  10/04/2023 Pt. Presents for  follow up. He states he has been doing well. He has gained 26-27 pounds. His appetite is much improved. Again, he feels this is an indication that these nodules we have been following are not cancer.   We have reviewed his most recent Ct Chest. 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 Two new ground-glass pulmonary nodules in the lower lobes, largest 0.9 cm in the  right lower lobe.  Again we discussed [name] concerns regarding navigational bronchoscopy with biopsy.  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  I did however have a very frank discussion with him that I am concerned that this is cancer, interventional radiology did not consider him a good candidate for a biopsy that did not involve general anesthesia.  We again discussed the procedure involved in navigational bronchoscopy with biopsy.  We reviewed the risks involved including bleeding, infection, pneumothorax, and adverse reaction to anesthesia.  Patient's daughter is coming from  out of town next week and he was in agreement with doing this while she was in town.  We have taken this opportunity to get the patient scheduled for a navigational bronchoscopy with biopsies on 10/16/2023.  He understands that a letter will be mailed to his home with all of the information.  My nurse Louella has also gone over the information with him by phone this afternoon.  I am encouraged by the fact that the patient has finally agreed to move forward with biopsy.  He had no questions or concerns at completion of the video visit regarding the procedure.  He understands he will follow-up with me 1 week after the procedure to the [facility] the results of the biopsy.  Observations/Objective: CT Chest 09/22/2023 IMPRESSION: 1. Part solid 3.1 cm peripheral right upper lobe lung mass with 0.3 cm solid component, minimally increased in size from 02/24/2023 chest CT. Indolent primary bronchogenic adenocarcinoma to be excluded. 2. Two new ground-glass pulmonary nodules in the lower lobes, largest 0.9 cm in the right lower lobe. 3. Numerous (greater than 10) solid pulmonary nodules scattered throughout both lungs, largest 1.0 cm along the right major fissure in the anterior right lower lobe, all unchanged from 02/24/2023 chest CT. 4. Mild to moderate mediastinal lymphadenopathy is stable to slightly increased from 02/24/2023 chest CT. Mild bilateral hilar lymphadenopathy is poorly delineated on these noncontrast images and not appreciably changed. Differential includes metastatic adenopathy versus lymphoproliferative disorder versus sarcoidosis. 5. Three-vessel coronary atherosclerosis. 6.  Aortic Atherosclerosis (ICD10-I70.0).  Imaging HISTORY: December 2024: Presented with anemia, unintentional weight loss and failure to thrive over several weeks 02/24/2023: CT chest: Part solid 2.9 x 1.5 cm peripheral right upper lobe pulmonary nodule with tiny 0.2 cm solid component.Several additional (at least  10) solid pulmonary nodules scattered throughout both lungs, largest 0.7 cm, predominantly along the fissures or subpleural. Mild mediastinal and bilateral hilar adenopathy, nonspecific. This combination of findings could indicate sarcoidosis, although metastatic adenopathy from the right upper lobe nodule or lymphoproliferative disease cannot be excluded. 03/01/2023: MRI Brain: No acute intracranial abnormality 07/03/2023: PET/CT scan: Numerous hypermetabolic nodes identified throughout the mediastinum, hilum and upper retroperitoneum. Retrocrural and paraspinal areas are identified. There are 2 small neck lymph nodes which are suspicious as well.There are small lung nodules. These also show significant uptake for size and would be worrisome for additional areas of disease. 07/17/2023: Establish care with [facility].  Recommendation for bronchoscopy with biopsies    Assessment and Plan: Lung Nodules with interval growth Lymphadenopathy  IR have rejected option of percutaneous biopsy 2/2 no safe/ adequate target.  Plan We have gone over the results of your CT scan which show interval growth to the 3.1 cm right upper lobe lung mass, as well as 2 new lung nodules. These are the areas that did show increased metabolic activity on your PET scan, as well as several lymph nodes. You have agreed to move forward with  tissue sampling for biopsy. I have placed an order for a bronchoscopy with biopsies.  We have discussed the procedure in detail.  We have reviewed the risks and benefits of the procedure. These include bleeding, infection, puncture of the lung, and adverse reaction to anesthesia. You have agreed to proceed with biopsy to evaluate the right upper lobe nodule. Your procedure will be done by Dr. Lamar Chris. You will receive a letter today with date time and information pertaining to the procedure. You will need someone to drive you to the procedure, stay with you during the  procedure, and stay with you after the procedure. You will also need someone to stay with you for 24 hours after anesthesia to ensure you have cleared and are doing well. You will follow-up with me 1 week after the procedure to review the results and to ensure you are doing well. Call if you need us  prior to the procedure or if you have any questions at all. Please contact office for sooner follow up if symptoms do not improve or worsen or seek emergency care       Follow Up Instructions: Bronchoscopy with biopsies is scheduled for 10/16/2023 Follow-up with Lauraine Bad NP 1 week after biopsy to review results and ensure you are doing well after the procedure Call if you need us  before hand    I discussed the assessment and treatment plan with the patient. The patient was provided an opportunity to ask questions and all were answered. The patient agreed with the plan and demonstrated an understanding of the instructions.   The patient was advised to call back or seek an in-person evaluation if the symptoms worsen or if the condition fails to improve as anticipated.  I spent 25 minutes dedicated to the care of this patient on the date of this encounter to include pre-visit review of records, video face-to-face time with the patient discussing conditions above, post visit ordering of testing, clinical documentation with the electronic health record, making appropriate referrals as documented, and communicating necessary information to the patient's healthcare team.   Lauraine JULIANNA Lites, NP

## 2023-10-04 NOTE — Patient Instructions (Signed)
 It is good to talk with you today. We have gone over the results of your CT scan which show interval growth to the 3.1 cm right upper lobe lung mass, as well as 2 new lung nodules. These are the areas that did show increased metabolic activity on your PET scan, as well as several lymph nodes. You have agreed to move forward with tissue sampling for biopsy. I have placed an order for a bronchoscopy with biopsies.  We have discussed the procedure in detail.  We have reviewed the risks and benefits of the procedure. These include bleeding, infection, puncture of the lung, and adverse reaction to anesthesia. You have agreed to proceed with biopsy to evaluate the right upper lobe nodule. Your procedure will be done by Dr. Lamar Chris. You will receive a letter today with date time and information pertaining to the procedure. You will need someone to drive you to the procedure, stay with you during the procedure, and stay with you after the procedure. You will also need someone to stay with you for 24 hours after anesthesia to ensure you have cleared and are doing well. You will follow-up with me 1 week after the procedure to review the results and to ensure you are doing well. Call if you need us  prior to the procedure or if you have any questions at all. Please contact office for sooner follow up if symptoms do not improve or worsen or seek emergency care

## 2023-10-04 NOTE — Telephone Encounter (Signed)
 Patient will be called by Corliss the nurse Case # [phone] I will forward to [facility] for auth

## 2023-10-04 NOTE — Telephone Encounter (Signed)
 Please schedule the following:  Provider performing procedure: Byrum Diagnosis:  Lung nodules, Lymphadenopathy Which side if for nodule / mass? Right , also lymph nodes Procedure:  Robotic assisted navigational bronchoscopy and EBUS with biopsies  Has patient been spoken to by Provider and given informed consent?  Yes 7.16.2026 by Lauraine Lites, NP Anesthesia:  General Do you need Fluro?  Yes Duration of procedure:  1.5 hours Date:  7/28 Alternate Date:  No alternate  Time: Would lime an 8:30 to 9 am arrival if possible Location:  [facility] Does patient have OSA?  No DM?  Yes, on po daily meds Or Latex allergy?  No Medication Restriction/ Anticoagulate/Antiplatelet:  None Pre-op Labs Ordered:determined by Anesthesia Imaging request:  None, recent CT Chest  (If, SuperDimension CT Chest, please have STAT courier sent to ENDO)

## 2023-10-05 ENCOUNTER — Encounter: Payer: Self-pay | Admitting: Oncology

## 2023-10-13 ENCOUNTER — Other Ambulatory Visit: Payer: Self-pay

## 2023-10-13 ENCOUNTER — Encounter (HOSPITAL_COMMUNITY): Payer: Self-pay | Admitting: Emergency Medicine

## 2023-10-13 NOTE — Progress Notes (Signed)
 SDW CALL  Patient was given pre-op instructions over the phone. The opportunity was given for the patient to ask questions. No further questions asked. Patient verbalized understanding of instructions given.   PCP -  Cardiologist -   PPM/ICD -  Device Orders -  Rep Notified -   Chest x-ray - CT 09/14/23 EKG - 07/20/22- obtain DOS Stress Test - denies ECHO - denies Cardiac Cath - denies  Sleep Study - denies CPAP -   Fasting Blood Sugar - 160 Checks Blood Sugar -has Freestyle Libre     THE MORNING OF SURGERY, do not take  NOVOLOG MIX 70/30 .  Check your blood sugar the morning of your surgery when you wake up and every 2 hours until you get to the Short Stay unit.  If your blood sugar is less than 70 mg/dL, you will need to treat for low blood sugar: Do not take insulin. Treat a low blood sugar (less than 70 mg/dL) with  cup of clear juice (cranberry or apple), 4 glucose tablets, OR glucose gel. Recheck blood sugar in 15 minutes after treatment (to make sure it is greater than 70 mg/dL). If your blood sugar is not greater than 70 mg/dL on recheck, call [phone] for further instructions. Report your blood sugar to the short stay nurse when you get to Short Stay.   Blood Thinner Instructions:na Aspirin Instructions:na  ERAS Protcol -no PRE-SURGERY Ensure or G2-   COVID TEST- na   Anesthesia review: no  Patient denies shortness of breath, fever, cough and chest pain over the phone call   Special instructions:    Oral Hygiene is also important to reduce your risk of infection.  Remember - BRUSH YOUR TEETH THE MORNING OF SURGERY WITH YOUR REGULAR TOOTHPASTE

## 2023-10-16 ENCOUNTER — Ambulatory Visit (HOSPITAL_COMMUNITY)

## 2023-10-16 ENCOUNTER — Ambulatory Visit (HOSPITAL_COMMUNITY)
Admission: RE | Admit: 2023-10-16 | Discharge: 2023-10-16 | Disposition: A | Discharge status: Home / Self Care | Attending: Emergency Medicine | Admitting: Emergency Medicine

## 2023-10-16 ENCOUNTER — Ambulatory Visit (HOSPITAL_COMMUNITY): Admitting: Anesthesiology

## 2023-10-16 ENCOUNTER — Encounter (HOSPITAL_COMMUNITY): Payer: Self-pay | Admitting: Emergency Medicine

## 2023-10-16 ENCOUNTER — Encounter (HOSPITAL_COMMUNITY)
Admission: RE | Disposition: A | Discharge status: Home / Self Care | Payer: Self-pay | Source: Home / Self Care | Attending: Emergency Medicine

## 2023-10-16 DIAGNOSIS — F419 Anxiety disorder, unspecified: Secondary | ICD-10-CM | POA: Diagnosis not present

## 2023-10-16 DIAGNOSIS — I129 Hypertensive chronic kidney disease with stage 1 through stage 4 chronic kidney disease, or unspecified chronic kidney disease: Secondary | ICD-10-CM

## 2023-10-16 DIAGNOSIS — R590 Localized enlarged lymph nodes: Secondary | ICD-10-CM | POA: Diagnosis not present

## 2023-10-16 DIAGNOSIS — I888 Other nonspecific lymphadenitis: Secondary | ICD-10-CM | POA: Diagnosis not present

## 2023-10-16 DIAGNOSIS — R911 Solitary pulmonary nodule: Secondary | ICD-10-CM | POA: Diagnosis not present

## 2023-10-16 DIAGNOSIS — C3411 Malignant neoplasm of upper lobe, right bronchus or lung: Secondary | ICD-10-CM

## 2023-10-16 DIAGNOSIS — R599 Enlarged lymph nodes, unspecified: Secondary | ICD-10-CM | POA: Diagnosis present

## 2023-10-16 DIAGNOSIS — I10 Essential (primary) hypertension: Secondary | ICD-10-CM | POA: Diagnosis not present

## 2023-10-16 DIAGNOSIS — R918 Other nonspecific abnormal finding of lung field: Secondary | ICD-10-CM | POA: Diagnosis present

## 2023-10-16 DIAGNOSIS — D630 Anemia in neoplastic disease: Secondary | ICD-10-CM | POA: Diagnosis not present

## 2023-10-16 DIAGNOSIS — Z79899 Other long term (current) drug therapy: Secondary | ICD-10-CM | POA: Diagnosis not present

## 2023-10-16 DIAGNOSIS — N184 Chronic kidney disease, stage 4 (severe): Secondary | ICD-10-CM

## 2023-10-16 HISTORY — PX: VIDEO BRONCHOSCOPY WITH ENDOBRONCHIAL ULTRASOUND: SHX6177

## 2023-10-16 HISTORY — PX: VIDEO BRONCHOSCOPY WITH ENDOBRONCHIAL NAVIGATION: SHX6175

## 2023-10-16 LAB — BASIC METABOLIC PANEL WITH GFR
Anion gap: 12 (ref 5–15)
BUN: 49 mg/dL — ABNORMAL HIGH (ref 8–23)
CO2: 23 mmol/L (ref 22–32)
Calcium: 10.6 mg/dL — ABNORMAL HIGH (ref 8.9–10.3)
Chloride: 104 mmol/L (ref 98–111)
Creatinine, Ser: 2.79 mg/dL — ABNORMAL HIGH (ref 0.61–1.24)
GFR, Estimated: 23 mL/min — ABNORMAL LOW (ref >60)
Glucose, Bld: 171 mg/dL — ABNORMAL HIGH (ref 70–99)
Potassium: 4.3 mmol/L (ref 3.5–5.1)
Sodium: 139 mmol/L (ref 135–145)

## 2023-10-16 LAB — CBC
HCT: 29.4 % — ABNORMAL LOW (ref 39.0–52.0)
Hemoglobin: 9.6 g/dL — ABNORMAL LOW (ref 13.0–17.0)
MCH: 32.1 pg (ref 26.0–34.0)
MCHC: 32.7 g/dL (ref 30.0–36.0)
MCV: 98.3 fL (ref 80.0–100.0)
Platelets: 252 K/uL (ref 150–400)
RBC: 2.99 MIL/uL — ABNORMAL LOW (ref 4.22–5.81)
RDW: 12 % (ref 11.5–15.5)
WBC: 4.8 K/uL (ref 4.0–10.5)
nRBC: 0 % (ref 0.0–0.2)

## 2023-10-16 LAB — GLUCOSE, CAPILLARY
Glucose-Capillary: 158 mg/dL — ABNORMAL HIGH (ref 70–99)
Glucose-Capillary: 170 mg/dL — ABNORMAL HIGH (ref 70–99)

## 2023-10-16 SURGERY — VIDEO BRONCHOSCOPY WITH ENDOBRONCHIAL NAVIGATION
Anesthesia: General | Laterality: Right

## 2023-10-16 MED ORDER — OXYCODONE HCL 5 MG PO TABS: 5.0000 mg | ORAL_TABLET | Freq: Once | ORAL | Status: DC | PRN

## 2023-10-16 MED ORDER — CHLORHEXIDINE GLUCONATE 0.12 % MT SOLN
OROMUCOSAL | Status: AC
  Administered 2023-10-16: 15 mL via OROMUCOSAL
  Filled 2023-10-16: qty 15

## 2023-10-16 MED ORDER — INSULIN ASPART 100 UNIT/ML IJ SOLN: 0.0000 [IU] | INTRAMUSCULAR | Status: DC | PRN

## 2023-10-16 MED ORDER — DEXAMETHASONE SODIUM PHOSPHATE 10 MG/ML IJ SOLN
INTRAMUSCULAR | Status: DC | PRN
  Administered 2023-10-16: 10 mg via INTRAVENOUS

## 2023-10-16 MED ORDER — MEPERIDINE HCL 25 MG/ML IJ SOLN: 6.2500 mg | INTRAMUSCULAR | Status: DC | PRN

## 2023-10-16 MED ORDER — EPHEDRINE SULFATE (PRESSORS) 50 MG/ML IJ SOLN
INTRAMUSCULAR | Status: DC | PRN
Start: 2023-10-16 — End: 2023-10-16
  Administered 2023-10-16: 10 mg via INTRAVENOUS

## 2023-10-16 MED ORDER — FENTANYL CITRATE (PF) 100 MCG/2ML IJ SOLN: 25.0000 ug | INTRAMUSCULAR | Status: DC | PRN

## 2023-10-16 MED ORDER — ONDANSETRON HCL 4 MG/2ML IJ SOLN
INTRAMUSCULAR | Status: DC | PRN
  Administered 2023-10-16: 4 mg via INTRAVENOUS

## 2023-10-16 MED ORDER — CHLORHEXIDINE GLUCONATE 0.12 % MT SOLN: 15.0000 mL | Freq: Once | OROMUCOSAL | Status: AC

## 2023-10-16 MED ORDER — ONDANSETRON HCL 4 MG/2ML IJ SOLN: 4.0000 mg | Freq: Once | INTRAMUSCULAR | Status: DC | PRN

## 2023-10-16 MED ORDER — FENTANYL CITRATE (PF) 100 MCG/2ML IJ SOLN
INTRAMUSCULAR | Status: AC
Start: 2023-10-16 — End: 2023-10-16
  Filled 2023-10-16: qty 2

## 2023-10-16 MED ORDER — OXYCODONE HCL 5 MG/5ML PO SOLN: 5.0000 mg | Freq: Once | ORAL | Status: DC | PRN

## 2023-10-16 MED ORDER — LACTATED RINGERS IV SOLN: INTRAVENOUS | Status: DC

## 2023-10-16 MED ORDER — ROCURONIUM BROMIDE 10 MG/ML (PF) SYRINGE
PREFILLED_SYRINGE | INTRAVENOUS | Status: DC | PRN
  Administered 2023-10-16: 50 mg via INTRAVENOUS
  Administered 2023-10-16: 10 mg via INTRAVENOUS

## 2023-10-16 MED ORDER — PROPOFOL 10 MG/ML IV BOLUS
INTRAVENOUS | Status: DC | PRN
  Administered 2023-10-16: 150 mg via INTRAVENOUS

## 2023-10-16 MED ORDER — SUGAMMADEX SODIUM 200 MG/2ML IV SOLN
INTRAVENOUS | Status: DC | PRN
Start: 2023-10-16 — End: 2023-10-16
  Administered 2023-10-16: 200 mg via INTRAVENOUS

## 2023-10-16 MED ORDER — LIDOCAINE 2% (20 MG/ML) 5 ML SYRINGE
INTRAMUSCULAR | Status: DC | PRN
  Administered 2023-10-16: 100 mg via INTRAVENOUS

## 2023-10-16 SURGICAL SUPPLY — 36 items
ADAPTER BRONCHOSCOPE OLYMPUS (ADAPTER) ×1 IMPLANT
ADAPTER VALVE BIOPSY EBUS (MISCELLANEOUS) IMPLANT
BAG COUNTER SPONGE SURGICOUNT (BAG) ×1 IMPLANT
BRUSH CYTOL CELLEBRITY 1.5X140 (MISCELLANEOUS) ×1 IMPLANT
BRUSH SUPERTRAX BIOPSY (INSTRUMENTS) IMPLANT
BRUSH SUPERTRAX NDL-TIP CYTO (INSTRUMENTS) ×1 IMPLANT
CANISTER SUCTION 3000ML PPV (SUCTIONS) ×1 IMPLANT
CNTNR URN SCR LID CUP LEK RST (MISCELLANEOUS) ×1 IMPLANT
COVER BACK TABLE 60X90IN (DRAPES) ×1 IMPLANT
FILTER STRAW FLUID ASPIR (MISCELLANEOUS) IMPLANT
FORCEPS BIOP 1.5 SINGLE USE (MISCELLANEOUS) ×1 IMPLANT
FORCEPS BIOP SUPERTRX PREMAR (INSTRUMENTS) ×1 IMPLANT
GAUZE SPONGE 4X4 12PLY STRL (GAUZE/BANDAGES/DRESSINGS) ×1 IMPLANT
GLOVE BIO SURGEON STRL SZ7.5 (GLOVE) ×2 IMPLANT
GOWN STRL REUS W/ TWL LRG LVL3 (GOWN DISPOSABLE) ×2 IMPLANT
KIT CLEAN ENDO COMPLIANCE (KITS) ×1 IMPLANT
KIT LOCATABLE GUIDE (CANNULA) IMPLANT
KIT MARKER FIDUCIAL DELIVERY (KITS) IMPLANT
KIT TURNOVER KIT B (KITS) ×1 IMPLANT
MARKER SKIN DUAL TIP RULER LAB (MISCELLANEOUS) ×1 IMPLANT
NDL SUPERTRX PREMARK BIOPSY (NEEDLE) ×1 IMPLANT
NEEDLE SUPERTRX PREMARK BIOPSY (NEEDLE) ×1 IMPLANT
NS IRRIG 1000ML POUR BTL (IV SOLUTION) ×1 IMPLANT
OIL SILICONE PENTAX (PARTS (SERVICE/REPAIRS)) ×1 IMPLANT
PAD ARMBOARD POSITIONER FOAM (MISCELLANEOUS) ×2 IMPLANT
PATCHES PATIENT (LABEL) ×3 IMPLANT
SYR 20ML ECCENTRIC (SYRINGE) ×1 IMPLANT
SYR 20ML LL LF (SYRINGE) ×1 IMPLANT
SYR 50ML SLIP (SYRINGE) ×1 IMPLANT
TOWEL GREEN STERILE FF (TOWEL DISPOSABLE) ×1 IMPLANT
TRAP SPECIMEN MUCUS 40CC (MISCELLANEOUS) IMPLANT
TUBE CONNECTING 20X1/4 (TUBING) ×1 IMPLANT
UNDERPAD 30X36 HEAVY ABSORB (UNDERPADS AND DIAPERS) ×1 IMPLANT
VALVE BIOPSY SINGLE USE (MISCELLANEOUS) ×1 IMPLANT
VALVE SUCTION BRONCHIO DISP (MISCELLANEOUS) ×1 IMPLANT
WATER STERILE IRR 1000ML POUR (IV SOLUTION) ×1 IMPLANT

## 2023-10-16 NOTE — Anesthesia Postprocedure Evaluation (Signed)
 Anesthesia Post Note  Patient: Gene Fletcher  Procedure(s) Performed: VIDEO BRONCHOSCOPY WITH ENDOBRONCHIAL NAVIGATION (Right) BRONCHOSCOPY, WITH EBUS (Right)     Patient location during evaluation: PACU Anesthesia Type: General Level of consciousness: awake and alert Pain management: pain level controlled Vital Signs Assessment: post-procedure vital signs reviewed and stable Respiratory status: spontaneous breathing, nonlabored ventilation, respiratory function stable and patient connected to nasal cannula oxygen Cardiovascular status: blood pressure returned to baseline and stable Postop Assessment: no apparent nausea or vomiting Anesthetic complications: no   No notable events documented.  Last Vitals:  Vitals:   10/16/23 1320 10/16/23 1330  BP: (!) 164/66 (!) 158/62  Pulse: 62 62  Resp: 12 17  Temp:    SpO2: 98% 100%    Last Pain:  Vitals:   10/16/23 1330  TempSrc:   PainSc: 0-No pain                 Caylah Plouff

## 2023-10-16 NOTE — Progress Notes (Signed)
 Post-bronchoscopy CXR reviewed by Dr Shelah. Per Dr Shelah, okay to discharge pt to home.  Ozell VEAR Pouch, RN 10/16/23 1:43 PM

## 2023-10-16 NOTE — Op Note (Signed)
 Video Bronchoscopy with Endobronchial Ultrasound and Electromagnetic Navigation Procedure Note  Date of Operation: 10/16/2023  Pre-op Diagnosis: Right upper lobe groundglass mass, bilateral pulmonary nodules, mediastinal and hilar adenopathy  Post-op Diagnosis: Same  Surgeon: LAMAR CHRIS  Assistants: None  Anesthesia: General endotracheal anesthesia  Operation: Flexible video fiberoptic bronchoscopy with endobronchial ultrasound, robotic assisted navigation and biopsies.  Estimated Blood Loss: Minimal  Complications: None apparent  Indications and History: Gene Fletcher is a 73 y.o. male who is a never smoker found to have an abnormal CT scan of the chest with principally groundglass/mixed density right upper lobe mass, bilateral scattered pulmonary nodules, mediastinal and hilar adenopathy.  Recommendation was made to achieve a tissue diagnosis using endobronchial ultrasound and robotic assisted navigational bronchoscopy. The risks, benefits, complications, treatment options and expected outcomes were discussed with the patient.  The possibilities of pneumothorax, pneumonia, reaction to medication, pulmonary aspiration, perforation of a viscus, bleeding, failure to diagnose a condition and creating a complication requiring transfusion or operation were discussed with the patient who freely signed the consent.    Description of Procedure: The patient was seen in the Preoperative Area, was examined and was deemed appropriate to proceed.  The patient was taken to [facility] Endoscopy room 3, identified as Gene Fletcher and the procedure verified as Flexible Video Fiberoptic Bronchoscopy with robotic assisted navigation and endobronchial ultrasound.  A Time Out was held and the above information confirmed.   Robotic assisted navigation: Prior to the date of the procedure a high-resolution CT scan of the chest was performed. Utilizing ION software program a virtual tracheobronchial tree  was generated to allow the creation of distinct navigation pathways to the patient's parenchymal abnormalities. After being taken to the operating room general anesthesia was initiated and the patient  was orally intubated. The video fiberoptic bronchoscope was introduced via the endotracheal tube and a general inspection was performed which showed normal right and left lung anatomy. Aspiration of the bilateral mainstems was completed to remove any remaining secretions. Robotic catheter inserted into patient's endotracheal tube.   Target #1 right upper lobe groundglass mass: The distinct navigation pathways prepared prior to this procedure were then utilized to navigate to patient's lesion identified on CT scan. The robotic catheter was secured into place and the vision probe was withdrawn.  Lesion location was approximated using fluoroscopy.  Local registration and targeting was performed using [employer] Cios mobile C-arm three-dimensional imaging. Under fluoroscopic guidance transbronchial brushings, transbronchial needle biopsies, and transbronchial cryoprobe biopsies were performed to be sent for cytology and pathology.  Needle-in-lesion was confirmed using Cios mobile C-arm.    Target #2 right lower lobe solid lobulated peri-fissural nodule: The distinct navigation pathways prepared prior to this procedure were then utilized to navigate to patient's lesion identified on CT scan. The robotic catheter was secured into place and the vision probe was withdrawn.  Lesion location was approximated using fluoroscopy.  Local registration and targeting was performed using [employer] Cios mobile C-arm three-dimensional imaging. Under fluoroscopic guidance transbronchial brushings and transbronchial needle biopsies were performed to be sent for cytology and pathology.    Endobronchial ultrasound: The robotic scope was then withdrawn and the endobronchial ultrasound was used to identify and  characterize the peritracheal, hilar and bronchial lymph nodes. Inspection showed multiple enlarged nodes including station 4L, 12L, 7, 4R, 10R, 12R. Using real-time ultrasound guidance Wang needle biopsies were take from Station 4L, 7, 4R, 12R nodes and were sent for cytology.   At the end  of the procedure a general airway inspection was performed and there was no evidence of active bleeding. The bronchoscope was removed.  The patient tolerated the procedure well. There was no significant blood loss and there were no obvious complications. A post-procedural chest x-ray is pending.  Samples Target #1: 1. Transbronchial brushings from right upper lobe mass 2. Transbronchial Wang needle biopsies from right upper lobe mass 3. Transbronchial cryoprobe biopsies from right upper lobe mass  Samples Target #2: 1. Transbronchial brushings from right lower lobe nodule 2. Transbronchial Wang needle biopsies from right lower lobe nodule  EBUS Samples: 1. Wang needle biopsies from 4L node 2. Wang needle biopsies from 7 node 3. Wang needle biopsies from 4R node 4. Wang needle biopsies from 12R node   Lamar Chris, MD, PhD 10/16/2023, 12:54 PM [facility]

## 2023-10-16 NOTE — Transfer of Care (Signed)
 Immediate Anesthesia Transfer of Care Note  Patient: Gene Fletcher  Procedure(s) Performed: VIDEO BRONCHOSCOPY WITH ENDOBRONCHIAL NAVIGATION (Right) BRONCHOSCOPY, WITH EBUS (Right)  Patient Location: PACU and Endoscopy Unit  Anesthesia Type:General  Level of Consciousness: drowsy and patient cooperative  Airway & Oxygen Therapy: Patient Spontanous Breathing and Patient connected to face mask oxygen  Post-op Assessment: Report given to RN, Post -op Vital signs reviewed and stable, and Patient moving all extremities  Post vital signs: Reviewed and stable  Last Vitals:  Vitals Value Taken Time  BP 160/78 10/16/23 13:08  Temp    Pulse 76 10/16/23 13:09  Resp 15 10/16/23 13:09  SpO2 99 % 10/16/23 13:09  Vitals shown include unfiled device data.  Last Pain:  Vitals:   10/16/23 0925  TempSrc:   PainSc: 0-No pain         Complications: No notable events documented.

## 2023-10-16 NOTE — Anesthesia Procedure Notes (Signed)
 Procedure Name: Intubation Date/Time: 10/16/2023 11:29 AM  Performed by: Shlomo Tinnie SAILOR, RNPre-anesthesia Checklist: Patient identified, Patient being monitored, Timeout performed, Emergency Drugs available and Suction available Patient Re-evaluated:Patient Re-evaluated prior to induction Oxygen Delivery Method: Circle system utilized Preoxygenation: Pre-oxygenation with 100% oxygen Induction Type: IV induction Ventilation: Mask ventilation without difficulty Laryngoscope Size: McGrath and 4 Grade View: Grade I Tube type: Oral Tube size: 8.5 mm Number of attempts: 1 Airway Equipment and Method: Stylet Placement Confirmation: ETT inserted through vocal cords under direct vision, positive ETCO2 and breath sounds checked- equal and bilateral Secured at: 23 cm Tube secured with: Tape Dental Injury: Teeth and Oropharynx as per pre-operative assessment

## 2023-10-16 NOTE — Interval H&P Note (Signed)
 History and Physical Interval Note:  10/16/2023 11:17 AM  Gene Fletcher  has presented today for surgery, with the diagnosis of lung nodules and adenopathy.  The various methods of treatment have been discussed with the patient and family. After consideration of risks, benefits and other options for treatment, the patient has consented to  Procedure(s): VIDEO BRONCHOSCOPY WITH ENDOBRONCHIAL NAVIGATION (Right) BRONCHOSCOPY, WITH EBUS (Right) as a surgical intervention.  The patient's history has been reviewed, patient examined, no change in status, stable for surgery.  I have reviewed the patient's chart and labs.  Questions were answered to the patient's satisfaction.     Lamar GORMAN Chris

## 2023-10-16 NOTE — Op Note (Signed)
 Procedure Note  Patient: Gene Fletcher  [employer] Cios mobile C-arm was utilized to identify and biopsy right upper lobe mass and right lower lobe pulmonary nodule.  Needle-in-lesion was confirmed in the left upper lobe mass using real-time Cios imaging, and images were uploaded to PACS.   Lamar Chris, MD, PhD 10/16/2023, 1:03 PM [facility] [phone] or if no answer before 7:00PM call [phone] For any issues after 7:00PM please call eLink [phone]

## 2023-10-16 NOTE — Anesthesia Preprocedure Evaluation (Signed)
 Anesthesia Evaluation  Patient identified by MRN, date of birth, ID band Patient awake    Reviewed: Allergy & Precautions, H&P , NPO status , Patient's Chart, lab work & pertinent test results  Airway Mallampati: I  TM Distance: >3 FB Neck ROM: Full    Dental no notable dental hx. (+) Teeth Intact, Dental Advisory Given, Missing   Pulmonary neg pulmonary ROS   Pulmonary exam normal breath sounds clear to auscultation       Cardiovascular Exercise Tolerance: Good hypertension, Pt. on medications negative cardio ROS Normal cardiovascular exam Rhythm:Regular Rate:Normal     Neuro/Psych negative neurological ROS  negative psych ROS   GI/Hepatic negative GI ROS, Neg liver ROS,,,  Endo/Other  negative endocrine ROSdiabetes, Well Controlled, Type 2    Renal/GU CRFRenal diseasenegative Renal ROS  negative genitourinary   Musculoskeletal  (+) Arthritis ,    Abdominal   Peds negative pediatric ROS (+)  Hematology negative hematology ROS (+) Blood dyscrasia, anemia   Anesthesia Other Findings   Reproductive/Obstetrics negative OB ROS                              Anesthesia Physical Anesthesia Plan  ASA: 3  Anesthesia Plan: General   Post-op Pain Management: Minimal or no pain anticipated   Induction: Intravenous  PONV Risk Score and Plan: 2 and Ondansetron , Dexamethasone  and Treatment may vary due to age or medical condition  Airway Management Planned: Oral ETT  Additional Equipment: None  Intra-op Plan:   Post-operative Plan: Extubation in OR  Informed Consent: I have reviewed the patients History and Physical, chart, labs and discussed the procedure including the risks, benefits and alternatives for the proposed anesthesia with the patient or authorized representative who has indicated his/her understanding and acceptance.       Plan Discussed with: Anesthesiologist and  CRNA  Anesthesia Plan Comments: (  )        Anesthesia Quick Evaluation

## 2023-10-16 NOTE — Discharge Instructions (Addendum)

## 2023-10-17 ENCOUNTER — Encounter (HOSPITAL_COMMUNITY): Payer: Self-pay | Admitting: Emergency Medicine

## 2023-10-17 LAB — NO BLOOD PRODUCTS

## 2023-10-19 LAB — CYTOLOGY - NON PAP

## 2023-10-20 NOTE — Progress Notes (Signed)
 History of Present Illness Gene Fletcher is a 73 y.o. male never smoker ( second hand smoke exposure) with history of growing lung nodules. These were positive on PET scanning, but patient initially refused bronchoscopy due to concern about general anesthesia. He did finally agree to procedure ( He had significant weight loss) , and underwent navigational bronchoscopy with biopsies 09/22/2023. He is here to review the biopsy results.    10/25/2023 Discussed the use of AI scribe software for clinical note transcription with the patient, who gave verbal consent to proceed.  History of Present Illness Gene Fletcher is a 73 year old male here for post bronchoscopy follow up to discuss biopsy results , and ensure he has done well since his procedure.  He is accompanied by [name], a long-time friend., and his wife.  The biopsy of the right upper lobe showed adenocarcinoma, a type of non-small cell lung cancer. Recent testing found no malignant cells in the lymph nodes. He has no shortness of breath or other respiratory symptoms. He has experienced weight loss but has regained some weight with improved appetite. An MRI of the brain in December 2024 showed no metastasis. I will ask thoracic conference if he needs repeat MR Brain as it has been 9 months since the last scan.  We discussed the best options for treatment moving forward.      Test Results: CT Chest 09/22/2023 IMPRESSION: 1. Part solid 3.1 cm peripheral right upper lobe lung mass with 0.3 cm solid component, minimally increased in size from 02/24/2023 chest CT. Indolent primary bronchogenic adenocarcinoma to be excluded. 2. Two new ground-glass pulmonary nodules in the lower lobes, largest 0.9 cm in the right lower lobe. 3. Numerous (greater than 10) solid pulmonary nodules scattered throughout both lungs, largest 1.0 cm along the right major fissure in the anterior right lower lobe, all unchanged from  02/24/2023 chest CT. 4. Mild to moderate mediastinal lymphadenopathy is stable to slightly increased from 02/24/2023 chest CT. Mild bilateral hilar lymphadenopathy is poorly delineated on these noncontrast images and not appreciably changed. Differential includes metastatic adenopathy versus lymphoproliferative disorder versus sarcoidosis. 5. Three-vessel coronary atherosclerosis. 6.  Aortic Atherosclerosis (ICD10-I70.0).   Imaging HISTORY: December 2024: Presented with anemia, unintentional weight loss and failure to thrive over several weeks 02/24/2023: CT chest: Part solid 2.9 x 1.5 cm peripheral right upper lobe pulmonary nodule with tiny 0.2 cm solid component.Several additional (at least 10) solid pulmonary nodules scattered throughout both lungs, largest 0.7 cm, predominantly along the fissures or subpleural. Mild mediastinal and bilateral hilar adenopathy, nonspecific. This combination of findings could indicate sarcoidosis, although metastatic adenopathy from the right upper lobe nodule or lymphoproliferative disease cannot be excluded. 03/01/2023: MRI Brain: No acute intracranial abnormality 07/03/2023: PET/CT scan: Numerous hypermetabolic nodes identified throughout the mediastinum, hilum and upper retroperitoneum. Retrocrural and paraspinal areas are identified. There are 2 small neck lymph nodes which are suspicious as well.There are small lung nodules. These also show significant uptake for size and would be worrisome for additional areas of disease. 07/17/2023: Establish care with [facility].  Recommendation for bronchoscopy with biopsies        Cytology A. LUNG, RUL, FINE NEEDLE ASPIRATION:  - Adenocarcinoma, see comment  The  findings are most consistent with a low-grade lung adenocarcinoma.  Differential diagnosis can include metastatic thyroid  carcinoma   B. LUNG, RUL, BRUSHING:  - Malignant cells present   C. LUNG, RLL TARGET 2, FINE NEEDLE  ASPIRATION:  -  Atypical cells present   D. LUNG, RLL TARGET 2, BRUSHING:  - No malignant cells identified   E. LYMPH NODE, 4L, FINE NEEDLE ASPIRATION:  - No malignant cells identified  - Granulomatous inflammation  - Lymphoid tissue present  - See comment   F. LYMPH NODE, 7, FINE NEEDLE ASPIRATION:  - No malignant cells identified  - Lymphoid tissue present  - Granulomatous inflammation   G. LYMPH NODE, 4R, FINE NEEDLE ASPIRATION:  - No malignant cells identified  - Lymphoid tissue present  - Granulomatous inflammation   H. LYMPH NODE, 12R, FINE NEEDLE ASPIRATION:  - No malignant cells identified  - Lymphoid tissue present  - Granulomatous inflammation     Latest Ref Rng & Units 10/16/2023    9:08 AM 07/17/2023   11:37 AM 07/20/2022    7:03 PM  CBC  WBC 4.0 - 10.5 K/uL 4.8  5.4  6.7   Hemoglobin 13.0 - 17.0 g/dL 9.6  8.1  9.1   Hematocrit 39.0 - 52.0 % 29.4  23.9  27.6   Platelets 150 - 400 K/uL 252  263  246        Latest Ref Rng & Units 10/16/2023    9:08 AM 07/17/2023   11:37 AM 07/20/2022    7:03 PM  BMP  Glucose 70 - 99 mg/dL 828  773  792   BUN 8 - 23 mg/dL 49  56  35   Creatinine 0.61 - 1.24 mg/dL 7.20  7.64  7.74   Sodium 135 - 145 mmol/L 139  136  134   Potassium 3.5 - 5.1 mmol/L 4.3  4.8  4.1   Chloride 98 - 111 mmol/L 104  105  104   CO2 22 - 32 mmol/L 23  26  19    Calcium 8.9 - 10.3 mg/dL 89.3  8.7  8.2     BNP No results found for: BNP  ProBNP No results found for: PROBNP  PFT No results found for: FEV1PRE, FEV1POST, FVCPRE, FVCPOST, TLC, DLCOUNC, PREFEV1FVCRT, PSTFEV1FVCRT  DG C-ARM BRONCHOSCOPY Result Date: 10/16/2023 C-ARM BRONCHOSCOPY: Fluoroscopy was utilized by the requesting physician.  No radiographic interpretation.   DG Chest Port 1 View Result Date: 10/16/2023 CLINICAL DATA:  Bronchoscopy EXAM: PORTABLE CHEST 1 VIEW COMPARISON:  None Available. FINDINGS: Normal mediastinum and cardiac silhouette. Normal pulmonary  vasculature. No evidence of effusion, infiltrate, or pneumothorax. No acute bony abnormality. IMPRESSION: No complication following bronchoscopy. Electronically Signed   By: Jackquline Boxer M.D.   On: 10/16/2023 14:06     Past medical hx Past Medical History:  Diagnosis Date   Arthritis    Diabetes mellitus without complication (HCC)    Hyperlipidemia    Hypertension      Social History   Tobacco Use   Smoking status: Never    Passive exposure: Past   Smokeless tobacco: Never  Vaping Use   Vaping status: Never Used  Substance Use Topics   Alcohol  use: Not Currently    Alcohol /week: 2.0 standard drinks of alcohol     Types: 2 Standard drinks or equivalent per week   Drug use: No    Mr.Dunlevy reports that he has never smoked. He has been exposed to tobacco smoke. He has never used smokeless tobacco. He reports that he does not currently use alcohol  after a past usage of about 2.0 standard drinks of alcohol  per week. He reports that he does not use drugs.  Tobacco Cessation: Counseling given: Not Answered   Past surgical hx,  Family hx, Social hx all reviewed.  Current Outpatient Medications on File Prior to Visit  Medication Sig   amLODipine (NORVASC) 5 MG tablet Take 5 mg by mouth daily.   Continuous Glucose Sensor (FREESTYLE LIBRE 3 PLUS SENSOR) MISC once a week.   insulin  aspart protamine- aspart (NOVOLOG  MIX 70/30) (70-30) 100 UNIT/ML injection Inject 15-20 Units into the skin See admin instructions. Inject 20 units in the morning and 15 units at bedtime.   No current facility-administered medications on file prior to visit.     Allergies  Allergen Reactions   Flexeril  [Cyclobenzaprine ] Other (See Comments)    [place]. Hallucinations     Review Of Systems:  Constitutional:   No  weight loss, night sweats,  Fevers, chills, fatigue, or  lassitude.  HEENT:   No headaches,  Difficulty swallowing,  Tooth/dental problems, or  Sore throat,                No  sneezing, itching, ear ache, nasal congestion, post nasal drip,   CV:  No chest pain,  Orthopnea, PND, swelling in lower extremities, anasarca, dizziness, palpitations, syncope.   GI  No heartburn, indigestion, abdominal pain, nausea, vomiting, diarrhea, change in bowel habits, loss of appetite, bloody stools.   Resp: No shortness of breath with exertion or at rest.  No excess mucus, no productive cough,  No non-productive cough,  No coughing up of blood.  No change in color of mucus.  No wheezing.  No chest wall deformity  Skin: no rash or lesions.  GU: no dysuria, change in color of urine, no urgency or frequency.  No flank pain, no hematuria   MS:  No joint pain or swelling.  No decreased range of motion.  No back pain.  Psych:  No change in mood or affect. No depression or anxiety.  No memory loss.   Vital Signs BP (!) 154/77   Pulse 63   Temp (!) 97.5 F (36.4 C) (Oral)   Ht 5' 10 (1.778 m)   Wt 154 lb 9.6 oz (70.1 kg)   SpO2 100%   BMI 22.18 kg/m    Physical Exam:  General- No distress,  A&Ox3 ENT: No sinus tenderness, TM clear, pale nasal mucosa, no oral exudate,no post nasal drip, no LAN Cardiac: S1, S2, regular rate and rhythm, no murmur Chest: No wheeze/ rales/ dullness; no accessory muscle use, no nasal flaring, no sternal retractions Abd.: Soft Non-tender Ext: No clubbing cyanosis, edema Neuro:  normal strength Skin: No rashes, warm and dry Psych: normal mood and behavior   Assessment/Plan  Assessment and Plan Assessment & Plan Right upper lobe lung adenocarcinoma Adenocarcinoma confirmed in the right upper lobe with no lymph node metastasis. Suspicious right lower lobe lung lesion with atypical cell raise suspicion for malignancy. - Refer to radiation oncology for treatment planning. - Refer to medical oncology for comprehensive management . - Consider genetic and molecular testing on existing tissue for potential immune therapy options. - Pt. States  he does not want surgery, and asked not to be referred to surgery for consult.   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 No current symptoms or flares reported. - Ensure annual eye exams to monitor for ocular sarcoidosis. - Consider referral to cardiology for heart echo to assess for cardiac sarcoidosis involvement.  I spent 45 minutes dedicated to the care of this patient on the date of this encounter to include pre-visit review  of records, face-to-face time with the patient discussing conditions above, post visit ordering of testing, clinical documentation with the electronic health record, making appropriate referrals as documented, and communicating necessary information to the patient's healthcare team.      Lauraine JULIANNA Lites, NP 10/25/2023  10:56 AM

## 2023-10-24 LAB — CYTOLOGY - NON PAP

## 2023-10-25 ENCOUNTER — Encounter: Payer: Self-pay | Admitting: Medical Oncology

## 2023-10-25 ENCOUNTER — Ambulatory Visit (INDEPENDENT_AMBULATORY_CARE_PROVIDER_SITE_OTHER): Admitting: Acute Care

## 2023-10-25 ENCOUNTER — Encounter: Payer: Self-pay | Admitting: Oncology

## 2023-10-25 ENCOUNTER — Other Ambulatory Visit (HOSPITAL_COMMUNITY): Payer: Self-pay

## 2023-10-25 ENCOUNTER — Encounter: Payer: Self-pay | Admitting: Acute Care

## 2023-10-25 VITALS — BP 154/77 | HR 63 | Temp 97.5°F | Ht 70.0 in | Wt 154.6 lb

## 2023-10-25 DIAGNOSIS — R9389 Abnormal findings on diagnostic imaging of other specified body structures: Secondary | ICD-10-CM

## 2023-10-25 DIAGNOSIS — C3411 Malignant neoplasm of upper lobe, right bronchus or lung: Secondary | ICD-10-CM

## 2023-10-25 DIAGNOSIS — Z9889 Other specified postprocedural states: Secondary | ICD-10-CM

## 2023-10-25 DIAGNOSIS — R942 Abnormal results of pulmonary function studies: Secondary | ICD-10-CM

## 2023-10-25 NOTE — Progress Notes (Signed)
  [facility]  Diagnostic Nurse Navigator Treatment Team Hand-Off Note  10/25/23  Patient Name:  Gene Fletcher Patient MRN:  [id number] Patient DOB:  02-Sep-1950   Patient Care Team: [name], MD as PCP - General (Internal Medicine)  Chief Complaint Lung adenocarcinoma  Oncology History   No history exists.    Cancer Staging  No matching staging information was found for the patient.   SDOH Screening and Interventions Updated:  No  SDOH Screenings   Depression (PHQ2-9): Low Risk  (12/16/2021)  Tobacco Use: Low Risk  (10/25/2023)     Genetics Assessment Completed:  No Genetics Referral Made:  no  Care Team Updated:  Yes   Colene KYM Raider, RN, BSN, [facility] Oncology Nurse Navigator, [facility] 10/25/2023 11:06 AM

## 2023-10-25 NOTE — Patient Instructions (Signed)
 It is good to see you today. I am glad you have done well after the procedure. We have reviewed the biopsy results. There was a positive biopsy for lung cancer in the right upper lobe and atypical cells in the right lower lobe. This is a non small cell lung cancer, called adenocarcinoma. I have referred you to medical oncology and radiation oncology  for treatment. You will get calls to get this scheduled . We suspect that the lymph nodes in the chest are sarcoidosis. Please make sure you get an eye exam every year to have you eyes assessed.  Also, after you have had cancer treatment, consider 2 D echo to ensure there is no sarcoid in the heart.  You will get great care at the cancer center.  Call us  if you need us  for anything. Good luck with treatment. If unable to reach patient, please call his friend . [name] [phone] Please contact office for sooner follow up if symptoms do not improve or worsen or seek emergency care

## 2023-11-02 ENCOUNTER — Other Ambulatory Visit: Payer: Self-pay

## 2023-11-02 NOTE — Progress Notes (Signed)
 The proposed treatment discussed in conference is for discussion purpose only and is not a binding recommendation.  The patients have not been physically examined, or presented with their treatment options.  Therefore, final treatment plans cannot be decided.

## 2023-11-06 ENCOUNTER — Other Ambulatory Visit: Payer: Self-pay

## 2023-11-06 DIAGNOSIS — C3411 Malignant neoplasm of upper lobe, right bronchus or lung: Secondary | ICD-10-CM

## 2023-11-07 ENCOUNTER — Encounter: Payer: Self-pay | Admitting: Radiation Oncology

## 2023-11-07 ENCOUNTER — Ambulatory Visit
Admission: RE | Admit: 2023-11-07 | Discharge: 2023-11-07 | Disposition: A | Discharge status: Home / Self Care | Source: Ambulatory Visit | Attending: Radiation Oncology

## 2023-11-07 ENCOUNTER — Inpatient Hospital Stay: Attending: Internal Medicine | Admitting: Internal Medicine

## 2023-11-07 ENCOUNTER — Inpatient Hospital Stay

## 2023-11-07 ENCOUNTER — Ambulatory Visit
Admission: RE | Admit: 2023-11-07 | Discharge: 2023-11-07 | Disposition: A | Discharge status: Home / Self Care | Source: Ambulatory Visit | Attending: Radiation Oncology | Admitting: Radiation Oncology

## 2023-11-07 VITALS — BP 132/66 | HR 64 | Temp 97.3°F | Resp 17 | Ht 70.0 in | Wt 154.0 lb

## 2023-11-07 VITALS — BP 125/70 | HR 70 | Temp 97.3°F | Resp 18 | Ht 70.0 in | Wt 153.2 lb

## 2023-11-07 DIAGNOSIS — D860 Sarcoidosis of lung: Secondary | ICD-10-CM | POA: Insufficient documentation

## 2023-11-07 DIAGNOSIS — M129 Arthropathy, unspecified: Secondary | ICD-10-CM | POA: Diagnosis not present

## 2023-11-07 DIAGNOSIS — Z801 Family history of malignant neoplasm of trachea, bronchus and lung: Secondary | ICD-10-CM | POA: Insufficient documentation

## 2023-11-07 DIAGNOSIS — C3490 Malignant neoplasm of unspecified part of unspecified bronchus or lung: Secondary | ICD-10-CM

## 2023-11-07 DIAGNOSIS — E119 Type 2 diabetes mellitus without complications: Secondary | ICD-10-CM | POA: Insufficient documentation

## 2023-11-07 DIAGNOSIS — Z79899 Other long term (current) drug therapy: Secondary | ICD-10-CM | POA: Diagnosis not present

## 2023-11-07 DIAGNOSIS — E785 Hyperlipidemia, unspecified: Secondary | ICD-10-CM | POA: Insufficient documentation

## 2023-11-07 DIAGNOSIS — I10 Essential (primary) hypertension: Secondary | ICD-10-CM | POA: Diagnosis not present

## 2023-11-07 DIAGNOSIS — R918 Other nonspecific abnormal finding of lung field: Secondary | ICD-10-CM | POA: Insufficient documentation

## 2023-11-07 DIAGNOSIS — C3431 Malignant neoplasm of lower lobe, right bronchus or lung: Secondary | ICD-10-CM | POA: Insufficient documentation

## 2023-11-07 DIAGNOSIS — C3411 Malignant neoplasm of upper lobe, right bronchus or lung: Secondary | ICD-10-CM

## 2023-11-07 NOTE — Progress Notes (Signed)
 [facility] [phone]   [phone]  CONSULT NOTE  REFERRING PHYSICIAN: Dr. Lamar Chris  REASON FOR CONSULTATION:  73 years old African-American male recently diagnosed with lung cancer  HPI Gene Fletcher is a 73 y.o. male with past medical history significant for osteoarthritis, diabetes mellitus, hypertension and dyslipidemia.  The patient was complaining of anemia and unintended weight loss of 17 pounds in a short.  With failure to thrive.  He had CT of the chest without contrast on February 24, 2023 and that showed part solid 2.9 x 1.5 cm peripheral right upper lobe pulmonary nodule with tiny 0.2 cm solid component.  This was suspicious for bronchogenic adenocarcinoma and follow-up scan was recommended.  On July 03, 2023 he had a PET scan performed and that showed several areas of abnormal uptake along the lymph nodes in the mediastinum and hila bilaterally including precarinal node measuring 2.5 x 1.3 cm maximum SUV of 0.6.  There was also paratracheal on both sides, prevascular, subcarinal and right heart just above the diaphragm, right paraspinal lower thoracic spine and right-sided thoracic inlet lymph nodes there was also AP window lymph node measuring 1.3 x 1.7 cm with SUV of 7.7.  The scan also showed bilateral hilar lymph nodes the scan also showed numerous small lung nodules identified including a small nodule in the right upper lobe anterior to the right hilum measuring 0.4 cm with maximum SUV of 1.7 there is a focus more superiorly in the right upper lobe with maximum SUV of 2.7 and measuring 0.5 cm.  In the abdomen there was also lymphadenopathy in the upper retroperitoneum and 2 small neck lymph nodes suspicious as well and concerning for malignancy such as lymph node metastasis versus lymphoma.  CT's over the of the chest was performed on 09/14/2023 and that showed part solid 3.1 cm peripheral right upper lobe lung mass with 0.3 cm solid  component minimally increased in size from December 2024 and still suspicious for indolent primary bronchogenic adenocarcinoma.  There was also 2 new ground glass pulmonary nodules in the lower lobes the largest measures 0.9 cm in the right lower lobe and numerous greater than 10 solid pulmonary nodules scattered throughout both lungs the largest 1.0 cm along the right major fissure all unchanged from December 2024.  There is also mild to moderate mediastinal lymphadenopathy, stable.  On October 16, 2022 the patient underwent bronchoscopy with endobronchial sound and electromagnetic navigation procedure under the care of Dr. Chris.  The final cytology ([id number]) from the fine-needle aspiration of the right upper lobe was consistent with adenocarcinoma.  The fine-needle aspiration of the right lower lobe target 2 showed atypical cells.  The fine-needle aspiration of the lymph nodes including 4L, 7, 4R, 12R showed no malignant cells and it was consistent with granulomatous inflammation.  The patient was referred to me today for evaluation and recommendation regarding his condition.  HPI  Discussed the use of AI scribe software for clinical note transcription with the patient, who gave verbal consent to proceed.  History of Present Illness Gene Fletcher is a 73 year old male with recently diagnosed lung cancer who presents for initial evaluation. He is accompanied by his wife, Merlynn, and his friend, [name], who takes notes during the visit.  He was diagnosed with lung cancer following significant weight loss, which prompted a consultation with his family doctor. A CT scan of the chest on February 24, 2023, revealed a 2.9 by 1.5  cm nodule in the right upper lobe. Follow-up imaging on July 03, 2023, showed persistent nodules and enlarged lymph nodes. A subsequent CT scan on September 14, 2023, indicated a slight increase in the nodule size. A bronchoscopy with EBUS and biopsy confirmed adenocarcinoma  in the right upper lobe and atypical cells in the right lower lobe.  Biopsy of several of the hilar and mediastinal lymph node was consistent with inflammatory process, granulomatous disease.  His appetite has returned, and he is feeling good. He attributes his initial weight loss to an infected wisdom tooth extraction, which temporarily affected his ability to chew and eat. No current respiratory symptoms such as cough, hemoptysis, or shortness of breath. He also reports no nausea, vomiting, diarrhea, or headaches.  His past medical history includes high blood pressure, diabetes, and arthritis. He denies a history of high cholesterol, heart attacks, or strokes.  His family history is significant for diabetes in his mother, father, brother, and sister. His father had lung cancer and was a smoker.  Socially, he is married with one daughter and does not smoke or use street drugs. He consumes alcohol  in moderation.     Past Medical History:  Diagnosis Date   Arthritis    Diabetes mellitus without complication (HCC)    Hyperlipidemia    Hypertension       Past Surgical History:  Procedure Laterality Date   COLONOSCOPY     VIDEO BRONCHOSCOPY WITH ENDOBRONCHIAL NAVIGATION Right 10/16/2023   Procedure: VIDEO BRONCHOSCOPY WITH ENDOBRONCHIAL NAVIGATION;  Surgeon: Shelah Lamar RAMAN, MD;  Location: [facility] ENDOSCOPY;  Service: Pulmonary;  Laterality: Right;   VIDEO BRONCHOSCOPY WITH ENDOBRONCHIAL ULTRASOUND Right 10/16/2023   Procedure: BRONCHOSCOPY, WITH EBUS;  Surgeon: Shelah Lamar RAMAN, MD;  Location: [facility] ENDOSCOPY;  Service: Pulmonary;  Laterality: Right;    Family History  Problem Relation Age of Onset   Diabetes Mother    Diabetes Father    Lung cancer Father        smoker   Diabetes Sister    Diabetes Brother    Colon polyps Neg Hx    Colon cancer Neg Hx    Rectal cancer Neg Hx    Stomach cancer Neg Hx    Esophageal cancer Neg Hx     Social History Social History   Tobacco Use    Smoking status: Never    Passive exposure: Past   Smokeless tobacco: Never  Vaping Use   Vaping status: Never Used  Substance Use Topics   Alcohol  use: Not Currently    Alcohol /week: 2.0 standard drinks of alcohol     Types: 2 Standard drinks or equivalent per week   Drug use: No    Allergies  Allergen Reactions   Flexeril  [Cyclobenzaprine ] Other (See Comments)    [place]. Hallucinations     Current Outpatient Medications  Medication Sig Dispense Refill   amLODipine (NORVASC) 5 MG tablet Take 5 mg by mouth daily.     Continuous Glucose Sensor (FREESTYLE LIBRE 3 PLUS SENSOR) MISC once a week.     insulin  aspart protamine- aspart (NOVOLOG  MIX 70/30) (70-30) 100 UNIT/ML injection Inject 15-20 Units into the skin See admin instructions. Inject 20 units in the morning and 15 units at bedtime.     No current facility-administered medications for this visit.    Review of Systems  Constitutional: positive for weight loss Eyes: negative Ears, nose, mouth, throat, and face: negative Respiratory: positive for cough Cardiovascular: negative Gastrointestinal: negative Genitourinary:negative Integument/breast: negative Hematologic/lymphatic: negative  Musculoskeletal:negative Neurological: negative Behavioral/Psych: negative Endocrine: negative Allergic/Immunologic: negative  Physical Exam  MJO:jozmu, healthy, no distress, well nourished, and well developed SKIN: skin color, texture, turgor are normal, no rashes or significant lesions HEAD: Normocephalic, No masses, lesions, tenderness or abnormalities EYES: normal, PERRLA, Conjunctiva are pink and non-injected EARS: External ears normal, Canals clear OROPHARYNX:no exudate, no erythema, and lips, buccal mucosa, and tongue normal  NECK: supple, no adenopathy, no JVD LYMPH:  no palpable lymphadenopathy, no hepatosplenomegaly LUNGS: clear to auscultation , and palpation HEART: regular rate & rhythm, no murmurs, and no  gallops ABDOMEN:abdomen soft, non-tender, normal bowel sounds, and no masses or organomegaly BACK: Back symmetric, no curvature., No CVA tenderness EXTREMITIES:no joint deformities, effusion, or inflammation, no edema  NEURO: alert & oriented x 3 with fluent speech, no focal motor/sensory deficits  PERFORMANCE STATUS: ECOG 1  LABORATORY DATA: Lab Results  Component Value Date   WBC 4.8 10/16/2023   HGB 9.6 (L) 10/16/2023   HCT 29.4 (L) 10/16/2023   MCV 98.3 10/16/2023   PLT 252 10/16/2023      Chemistry      Component Value Date/Time   NA 139 10/16/2023 0908   K 4.3 10/16/2023 0908   CL 104 10/16/2023 0908   CO2 23 10/16/2023 0908   BUN 49 (H) 10/16/2023 0908   CREATININE 2.79 (H) 10/16/2023 0908   CREATININE 2.35 (H) 07/17/2023 1137      Component Value Date/Time   CALCIUM 10.6 (H) 10/16/2023 0908   ALKPHOS 143 (H) 07/17/2023 1137   AST 17 07/17/2023 1137   ALT 21 07/17/2023 1137   BILITOT 0.4 07/17/2023 1137       RADIOGRAPHIC STUDIES: DG C-ARM BRONCHOSCOPY Result Date: 10/16/2023 C-ARM BRONCHOSCOPY: Fluoroscopy was utilized by the requesting physician.  No radiographic interpretation.   DG Chest Port 1 View Result Date: 10/16/2023 CLINICAL DATA:  Bronchoscopy EXAM: PORTABLE CHEST 1 VIEW COMPARISON:  None Available. FINDINGS: Normal mediastinum and cardiac silhouette. Normal pulmonary vasculature. No evidence of effusion, infiltrate, or pneumothorax. No acute bony abnormality. IMPRESSION: No complication following bronchoscopy. Electronically Signed   By: Jackquline Boxer M.D.   On: 10/16/2023 14:06    ASSESSMENT: This is a very pleasant 73 years old African-American male with stage Ia (T1c, N0, M0) non-small cell lung cancer, adenocarcinoma presented with right upper lobe lung nodule in addition to suspicious right lower lobe nodule with atypical cells diagnosed in July 2025.   PLAN: Assessment and Plan Assessment & Plan Non-small cell lung cancer, right upper  lobe, stage IA, adenocarcinoma Confirmed diagnosis of non-small cell lung cancer, adenocarcinoma, in the right upper lobe, stage IA. Good prognosis for potential cure due to early stage, good lung function, and no smoking history. He prefers radiation therapy over surgery. - Proceed with radiation therapy. - Monitor post-treatment with regular scans for recurrence or progression. - Schedule follow-up in four months with a scan a week prior.  Pulmonary nodule, right lower lobe, suspicious for malignancy Nodule in the right lower lobe has atypical cells, remains suspicious for malignancy, but not confirmed as cancerous. - Monitor with regular imaging to assess changes in size or characteristics.  Sarcoidosis of intrathoracic lymph nodes Biopsy indicated sarcoidosis causing lymph node enlargement, unrelated to lung cancer. - Monitor intrathoracic lymph nodes for changes. I will see the patient back for follow-up visit in 4 months for evaluation with repeat CT scan of the chest for restaging of his disease. He was advised to call immediately if he has any other  concerning symptoms in the interval.  The patient voices understanding of current disease status and treatment options and is in agreement with the current care plan.  All questions were answered. The patient knows to call the clinic with any problems, questions or concerns. We can certainly see the patient much sooner if necessary.  Thank you so much for allowing me to participate in the care of Gene Fletcher. I will continue to follow up the patient with you and assist in his care. The total time spent in the appointment was 60 minutes including review of chart and various tests results, discussions about plan of care and coordination of care plan .   Disclaimer: This note was dictated with voice recognition software. Similar sounding words can inadvertently be transcribed and may not be corrected upon review.   Makayle Krahn K  Aaryav Hopfensperger November 07, 2023, 1:15 PM

## 2023-11-07 NOTE — Progress Notes (Signed)
 Thoracic Location of Tumor / Histology: RUL Lung  Patient presented    Bronchoscopy 10/16/2023:  Biopsies of RUL Lung 10/16/2023    Past/Anticipated interventions by pulmonary, if any: Sarah Groce 10/25/2023 - Refer to radiation oncology for treatment planning. - Refer to medical oncology for comprehensive management . - Consider genetic and molecular testing on existing tissue for potential immune therapy options. - Pt. States he does not want surgery, and asked not to be referred to surgery for consult.   Past/Anticipated interventions by cardiothoracic surgery, if any:   Past/Anticipated interventions by medical oncology, if any:  Dr. Sherrod 11/07/2023   Tobacco/Marijuana/Snuff/ETOH use: Non Smoker  Signs/Symptoms Weight changes, if any: Stable Respiratory complaints, if any: No Hemoptysis, if any: None Pain issues, if any:  None  SAFETY ISSUES: Prior radiation? No Pacemaker/ICD? No Possible current pregnancy? N/a Is the patient on methotrexate? No  Current Complaints / other details:

## 2023-11-07 NOTE — Progress Notes (Signed)
 This NN met with the pt in person during his medical oncology consult appointment with Dr Sherrod. Pt was accompanied by his wife, Gene Fletcher, and their friend/pastor, Gene Fletcher. Pt is not currently interested in surgery and will be pursuing radiation as his primary treatment. Pt had a consult appointment today with Dr Dewey and Donald Husband.  Pt will follow up with Dr Sherrod in approximately 4 months with a CT and labs prior to his appointment. NN provided pt with her direct contact information and encouraged the pt to call with any questions or concerns.

## 2023-11-07 NOTE — Progress Notes (Signed)
 Radiation Oncology         [phone] ________________________________  Name: Gene Fletcher        MRN: [id number]  Date of Service: 11/07/2023 DOB: [birth year]  CC:Gene Hamilton, MD  Shelah Lamar RAMAN, MD     REFERRING PHYSICIAN: Shelah Lamar RAMAN, MD   DIAGNOSIS: The primary encounter diagnosis was Malignant 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). A diagnosis of Malignant neoplasm of lower lobe of right lung [facility]) was also pertinent to this visit.   HISTORY OF PRESENT ILLNESS: Gene Fletcher is a 73 y.o. male seen at the request of Dr. Shelah with a newly diagnosed lung cancer. The patient had a wisdom tooth extracted and subsequent infection and had difficulty with eating. He lost about 30 pounds in about a month's time and was seen for further work up which included a CT chest without contrast on 02/24/23 that showed at least 10 solid pulmonary nodules thorught the lungs and mild mediastinal and bilateral hilar adenopathy concerning for possible sarcoidosis versus malignant process. He was counseled in December 2024 by Dr. Alaine about repeat imaging and on 07/03/23 had a PET scan that showed multiple lung nodules and mediastinal adenopathy.  Specifically, a precarinal lymph node with an SUV of 10.6 measuring 2.5 cm, and an AP window lymph node on the left measuring 17 mm with an SUV of 7.7, and foci on the right having SUVs of 9.2 and 9.8 on the left with calcified nodes in the prevascular, subcarinal and right of the heart just above the diaphragm were noted and right paraspinal lower thoracic and right thoracic inlet nodes.  There was also question of hypermetabolic submandibular lymph node with an SUV of 3.6. Initially he declined biopsy but ultimately proceeded with plans for bronchoscopy.  A CT super D on 09/14/2023 measured at 1.4 cm right paratracheal lymph node, a 1.3 cm AP window lymph node, a 1.7 cm subcarinal lymph node and a 9 mm right lower lobe nodule and 3.1 cm right  upper lobe nodule, a new groundglass 7 mm nodule in the left lower lobe was appreciated and a 1 cm right lower lobe nodule was persistent.  Bronchoscopy on 10/16/2023 with Dr. Shelah identified adenocarcinoma in the fine-needle aspirate of the right upper lobe brushings showed malignant cells, the second target in the right lower lobe fine-needle aspirate showed atypical cells and brushings were negative.  Sampling of the 4L, 7, 4R, and 12 are stations were negative for malignancy but showed granulomatous changes.  Given these findings he was offered evaluation with cardiothoracic surgery however declined and wishes to consider less invasive approaches.  He is seen today to discuss stereotactic body radiotherapy to his right upper lobe and right lower lobe nodules.   PREVIOUS RADIATION THERAPY: No   PAST MEDICAL HISTORY:  Past Medical History:  Diagnosis Date   Arthritis    Diabetes mellitus without complication (HCC)    Hyperlipidemia    Hypertension        PAST SURGICAL HISTORY: Past Surgical History:  Procedure Laterality Date   COLONOSCOPY     VIDEO BRONCHOSCOPY WITH ENDOBRONCHIAL NAVIGATION Right 10/16/2023   Procedure: VIDEO BRONCHOSCOPY WITH ENDOBRONCHIAL NAVIGATION;  Surgeon: Shelah Lamar RAMAN, MD;  Location: [facility] ENDOSCOPY;  Service: Pulmonary;  Laterality: Right;   VIDEO BRONCHOSCOPY WITH ENDOBRONCHIAL ULTRASOUND Right 10/16/2023   Procedure: BRONCHOSCOPY, WITH EBUS;  Surgeon: Shelah Lamar RAMAN, MD;  Location: [facility] ENDOSCOPY;  Service: Pulmonary;  Laterality: Right;     FAMILY  HISTORY:  Family History  Problem Relation Age of Onset   Diabetes Mother    Diabetes Father    Lung cancer Father        smoker   Diabetes Sister    Diabetes Brother    Colon polyps Neg Hx    Colon cancer Neg Hx    Rectal cancer Neg Hx    Stomach cancer Neg Hx    Esophageal cancer Neg Hx      SOCIAL HISTORY:  reports that he has never smoked. He has been exposed to tobacco smoke. He has never used  smokeless tobacco. He reports that he does not currently use alcohol  after a past usage of about 2.0 standard drinks of alcohol  per week. He reports that he does not use drugs.  The patient is married and lives in [place].  He is accompanied by his wife, his best friend Ubaldo and Larry's wife.      ALLERGIES: Flexeril  [cyclobenzaprine ]   MEDICATIONS:  Current Outpatient Medications  Medication Sig Dispense Refill   amLODipine (NORVASC) 5 MG tablet Take 5 mg by mouth daily.     Continuous Glucose Sensor (FREESTYLE LIBRE 3 PLUS SENSOR) MISC once a week.     insulin  aspart protamine- aspart (NOVOLOG  MIX 70/30) (70-30) 100 UNIT/ML injection Inject 15-20 Units into the skin See admin instructions. Inject 20 units in the morning and 15 units at bedtime.     No current facility-administered medications for this encounter.     REVIEW OF SYSTEMS: On review of systems, the patient reports that he is doing okay overall.  He does have a history of cough and reports that this happens after he laughs, he reports that he is not seeing any hemoptysis, and his weight loss improved, but he is still under his baseline weight of 179 pounds.  He states that he is not having any shortness of breath or chest pain.  He is a non-smoker but was exposed to secondhand smoke.  No other complaints are verbalized.    PHYSICAL EXAM:  Wt Readings from Last 3 Encounters:  11/07/23 153 lb 4 oz (69.5 kg)  10/25/23 154 lb 9.6 oz (70.1 kg)  10/16/23 157 lb (71.2 kg)   Temp Readings from Last 3 Encounters:  11/07/23 (!) 97.3 F (36.3 C) (Temporal)  10/25/23 (!) 97.5 F (36.4 C) (Oral)  10/16/23 98.2 F (36.8 C) (Oral)   BP Readings from Last 3 Encounters:  11/07/23 125/70  10/25/23 (!) 154/77  10/16/23 (!) 158/62   Pulse Readings from Last 3 Encounters:  11/07/23 70  10/25/23 63  10/16/23 62   Pain Assessment Pain Score: 0-No pain/10  In general this is a well appearing African-American male in no acute  distress. He's alert and oriented x4 and appropriate throughout the examination. Cardiopulmonary assessment is negative for acute distress and he exhibits normal effort.     ECOG = 1  0 - Asymptomatic (Fully active, able to carry on all predisease activities without restriction)  1 - Symptomatic but completely ambulatory (Restricted in physically strenuous activity but ambulatory and able to carry out work of a light or sedentary nature. For example, light housework, office work)  2 - Symptomatic, <50% in bed during the day (Ambulatory and capable of all self care but unable to carry out any work activities. Up and about more than 50% of waking hours)  3 - Symptomatic, >50% in bed, but not bedbound (Capable of only limited self-care, confined to bed or chair  50% or more of waking hours)  4 - Bedbound (Completely disabled. Cannot carry on any self-care. Totally confined to bed or chair)  5 - Death   Raylene MM, Creech RH, Tormey DC, et al. [phone]). Toxicity and response criteria of the [facility]. Am. DOROTHA Bridges. Oncol. 5 (6): 649-55    LABORATORY DATA:  Lab Results  Component Value Date   WBC 4.8 10/16/2023   HGB 9.6 (L) 10/16/2023   HCT 29.4 (L) 10/16/2023   MCV 98.3 10/16/2023   PLT 252 10/16/2023   Lab Results  Component Value Date   NA 139 10/16/2023   K 4.3 10/16/2023   CL 104 10/16/2023   CO2 23 10/16/2023   Lab Results  Component Value Date   ALT 21 07/17/2023   AST 17 07/17/2023   ALKPHOS 143 (H) 07/17/2023   BILITOT 0.4 07/17/2023      RADIOGRAPHY: DG C-ARM BRONCHOSCOPY Result Date: 10/16/2023 C-ARM BRONCHOSCOPY: Fluoroscopy was utilized by the requesting physician.  No radiographic interpretation.   DG Chest Port 1 View Result Date: 10/16/2023 CLINICAL DATA:  Bronchoscopy EXAM: PORTABLE CHEST 1 VIEW COMPARISON:  None Available. FINDINGS: Normal mediastinum and cardiac silhouette. Normal pulmonary vasculature. No evidence of effusion,  infiltrate, or pneumothorax. No acute bony abnormality. IMPRESSION: No complication following bronchoscopy. Electronically Signed   By: Jackquline Boxer M.D.   On: 10/16/2023 14:06       IMPRESSION/PLAN: 1. Stage IB, cT2aN0M0, NSCLC, adenocarcinoma of the RUL with synchronous Putative Stage IA1, cT1aN0M0, NSCLC of the the RLL in a setting of multiple nodules and mediastinal adenopathy consistent with sarcoidosis. Dr. Dewey discusses the pathology findings and reviews the nature of early-stage lung cancer.  He reviews the patient's prior imaging and workup to date as well as discusses that clinically though cytology showed atypia that the right lower lobe target appears radiographically consistent with a malignancy.  For this reason, Dr. Dewey discusses that he would offer treatment to both the right upper lobe and right lower lobe targets as the patient has decided that he would not be interested in surgery.  I again offered this referral today but he declines as he desires less invasive treatment options. We discussed the risks, benefits, short, and long term effects of radiotherapy, as well as the curative intent, and the patient is interested in proceeding. Dr. Dewey discusses the delivery and logistics of radiotherapy and anticipates a course of 3-5 fractions of stereotactic body radiotherapy (SBRT). Written consent is obtained and placed in the chart, a copy was provided to the patient. The patient will be contacted to coordinate treatment planning by our simulation department.  He will follow-up with Dr. Sherrod and we also discussed the importance of him being followed by pulmonary medicine for concerns of managing his sarcoidosis.   In a visit lasting 65 minutes, greater than 50% of the time was spent face to face discussing the patient's condition, in preparation for the discussion, and coordinating the patient's care.   The above documentation reflects my direct findings during this shared  patient visit. Please see the separate note by Dr. Dewey on this date for the remainder of the patient's plan of care.    Donald KYM Husband, [facility]   **Disclaimer: This note was dictated with voice recognition software. Similar sounding words can inadvertently be transcribed and this note may contain transcription errors which may not have been corrected upon publication of note.**

## 2023-11-08 ENCOUNTER — Telehealth: Payer: Self-pay | Admitting: *Deleted

## 2023-11-08 NOTE — Telephone Encounter (Signed)
 Received vm call from yesterday @ 4:32 pm from daughter, Jame asking for treatment plan for her dad.  She reports living out of town & needing to make arrangements to be available for dad.  Returned call & left message to talk with her mom & friend who were with pt yesterday & to call back to speak with radiation oncology for details but looks like pt is only doing radiation & has app 8/27 for simulation & will see Dr MM in Jan of 26 for f/u.

## 2023-11-15 ENCOUNTER — Ambulatory Visit
Admission: RE | Admit: 2023-11-15 | Discharge: 2023-11-15 | Disposition: A | Discharge status: Home / Self Care | Source: Ambulatory Visit | Attending: Radiation Oncology | Admitting: Radiation Oncology

## 2023-11-15 DIAGNOSIS — C3411 Malignant neoplasm of upper lobe, right bronchus or lung: Secondary | ICD-10-CM | POA: Insufficient documentation

## 2023-12-03 ENCOUNTER — Ambulatory Visit
Admission: RE | Admit: 2023-12-03 | Discharge: 2023-12-03 | Disposition: A | Discharge status: Home / Self Care | Source: Ambulatory Visit | Attending: Radiation Oncology | Admitting: Radiation Oncology

## 2023-12-03 DIAGNOSIS — C3411 Malignant neoplasm of upper lobe, right bronchus or lung: Secondary | ICD-10-CM | POA: Insufficient documentation

## 2023-12-04 ENCOUNTER — Other Ambulatory Visit: Payer: Self-pay

## 2023-12-04 DIAGNOSIS — C3411 Malignant neoplasm of upper lobe, right bronchus or lung: Secondary | ICD-10-CM | POA: Diagnosis not present

## 2023-12-04 LAB — RAD ONC ARIA SESSION SUMMARY
Course Elapsed Days: 0
Plan Fractions Treated to Date: 1
Plan Fractions Treated to Date: 1
Plan Prescribed Dose Per Fraction: 18 Gy
Plan Prescribed Dose Per Fraction: 18 Gy
Plan Total Fractions Prescribed: 3
Plan Total Fractions Prescribed: 3
Plan Total Prescribed Dose: 54 Gy
Plan Total Prescribed Dose: 54 Gy
Reference Point Dosage Given to Date: 18 Gy
Reference Point Dosage Given to Date: 18 Gy
Reference Point Session Dosage Given: 18 Gy
Reference Point Session Dosage Given: 18 Gy
Session Number: 1

## 2023-12-05 ENCOUNTER — Ambulatory Visit: Admitting: Radiation Oncology

## 2023-12-06 ENCOUNTER — Other Ambulatory Visit: Payer: Self-pay

## 2023-12-06 ENCOUNTER — Ambulatory Visit
Admission: RE | Admit: 2023-12-06 | Discharge: 2023-12-06 | Disposition: A | Discharge status: Home / Self Care | Source: Ambulatory Visit | Attending: Radiation Oncology | Admitting: Radiation Oncology

## 2023-12-06 DIAGNOSIS — C3411 Malignant neoplasm of upper lobe, right bronchus or lung: Secondary | ICD-10-CM | POA: Diagnosis not present

## 2023-12-06 LAB — RAD ONC ARIA SESSION SUMMARY
Course Elapsed Days: 2
Plan Fractions Treated to Date: 2
Plan Fractions Treated to Date: 2
Plan Prescribed Dose Per Fraction: 18 Gy
Plan Prescribed Dose Per Fraction: 18 Gy
Plan Total Fractions Prescribed: 3
Plan Total Fractions Prescribed: 3
Plan Total Prescribed Dose: 54 Gy
Plan Total Prescribed Dose: 54 Gy
Reference Point Dosage Given to Date: 36 Gy
Reference Point Dosage Given to Date: 36 Gy
Reference Point Session Dosage Given: 18 Gy
Reference Point Session Dosage Given: 18 Gy
Session Number: 2

## 2023-12-07 ENCOUNTER — Ambulatory Visit: Admitting: Radiation Oncology

## 2023-12-08 ENCOUNTER — Ambulatory Visit: Admitting: Radiation Oncology

## 2023-12-11 ENCOUNTER — Ambulatory Visit
Admission: RE | Admit: 2023-12-11 | Discharge: 2023-12-11 | Disposition: A | Discharge status: Home / Self Care | Source: Ambulatory Visit | Attending: Radiation Oncology | Admitting: Radiation Oncology

## 2023-12-11 ENCOUNTER — Other Ambulatory Visit: Payer: Self-pay

## 2023-12-11 ENCOUNTER — Ambulatory Visit

## 2023-12-11 DIAGNOSIS — C3411 Malignant neoplasm of upper lobe, right bronchus or lung: Secondary | ICD-10-CM | POA: Diagnosis not present

## 2023-12-11 LAB — RAD ONC ARIA SESSION SUMMARY

## 2023-12-11 NOTE — Progress Notes (Signed)
  Radiation Oncology         [phone] ________________________________  Name: Gene Fletcher MRN: [id number]  Date: 12/11/2023  DOB: 1950-03-30  End of Treatment Note  Diagnosis:  Stage IB, cT2aN0M0, NSCLC, adenocarcinoma of the RUL with synchronous Putative Stage IA1, cT1aN0M0, NSCLC of the the RLL in a setting of multiple nodules and mediastinal adenopathy consistent with sarcoidosis.   Indication for treatment:  Curative       Radiation treatment dates:   12/04/23-12/11/23  Site/dose:   The tumors in the RUL and RLL were each treated with a course of stereotactic body radiation treatment. The patient received 54 Gy In 3 fractions at 18 Gy per fraction.  Narrative: The patient tolerated radiation treatment relatively well.   The patient did not have any signs of acute toxicity during treatment.  Plan: The patient will receive a call in about one month from the radiation oncology department. He will continue follow up with Dr. Sherrod as well for surveillance.       Donald KYM Husband, PAC

## 2023-12-12 ENCOUNTER — Ambulatory Visit: Admitting: Radiation Oncology

## 2023-12-12 NOTE — Radiation Completion Notes (Signed)
 Patient Name: Gene Fletcher, Gene Fletcher MRN: [id number] Date of Birth: 02/27/1951 Referring Physician: LAMAR CHRIS, M.D. Date of Service: 2023-12-12 Radiation Oncologist: Norleen Limes, M.D. [facility]                             RADIATION ONCOLOGY END OF TREATMENT NOTE     Diagnosis: C34.11 Malignant neoplasm of upper lobe, right bronchus or lung Staging on 2023-11-07: Malignant 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) T=cT1c, N=cN0, M=cM0 Intent: Curative     ==========DELIVERED PLANS==========  First Treatment Date: 2023-12-04 Last Treatment Date: 2023-12-11   Plan Name: Lung_RUL_SBRT Site: Lung, Right Technique: SBRT/SRT-IMRT Mode: Photon Dose Per Fraction: 18 Gy Prescribed Dose (Delivered / Prescribed): 54 Gy / 54 Gy Prescribed Fxs (Delivered / Prescribed): 3 / 3   Plan Name: Lung_RLL_SBRT Site: Lung, Right Technique: SBRT/SRT-IMRT Mode: Photon Dose Per Fraction: 18 Gy Prescribed Dose (Delivered / Prescribed): 54 Gy / 54 Gy Prescribed Fxs (Delivered / Prescribed): 3 / 3     ==========ON TREATMENT VISIT DATES========== 2023-12-04, 2023-12-04, 2023-12-06, 2023-12-06, 2023-12-11, 2023-12-11     ==========UPCOMING VISITS==========       ==========APPENDIX - ON TREATMENT VISIT NOTES==========   See weekly On Treatment Notes in Epic for details in the Media tab (listed as Progress notes on the On Treatment Visit Dates listed above).

## 2023-12-14 ENCOUNTER — Ambulatory Visit: Admitting: Radiation Oncology

## 2023-12-15 ENCOUNTER — Inpatient Hospital Stay: Attending: Internal Medicine | Admitting: General Practice

## 2023-12-15 NOTE — Progress Notes (Signed)
 [facility] Advance Directives Spiritual Care  Patient presented to Advance Directives Clinic  to review and complete healthcare advance directives.  Clinical Chaplain met with patient, his wife, and their daughter Gene Fletcher.  The patient designated daughter Gene Fletcher of [place] [phone]) as their primary healthcare agent and Gene Fletcher of [place] [phone]) as their secondary agent.  Patient also completed healthcare living will.    Documents were notarized and copies made for patient/family. Chaplain will send documents to medical records to be scanned into patient's chart. Chaplain encouraged patient/family to contact with any additional questions or concerns.   [address] [name], [place], [facility] Pager [phone] Voicemail [phone]

## 2023-12-21 ENCOUNTER — Other Ambulatory Visit (HOSPITAL_COMMUNITY): Payer: Self-pay

## 2023-12-21 MED ORDER — TADALAFIL 5 MG PO TABS
5.0000 mg | ORAL_TABLET | Freq: Every day | ORAL | 0 refills | Status: AC | PRN
  Filled 2023-12-21: qty 30, 30d supply, fill #0

## 2023-12-26 ENCOUNTER — Other Ambulatory Visit (HOSPITAL_COMMUNITY): Payer: Self-pay

## 2023-12-27 ENCOUNTER — Other Ambulatory Visit (HOSPITAL_COMMUNITY): Payer: Self-pay

## 2023-12-27 MED ORDER — ATROPINE SULFATE 1 % OP SOLN
1.0000 [drp] | Freq: Every day | OPHTHALMIC | 2 refills | Status: AC
  Filled 2023-12-27: qty 5, 50d supply, fill #0

## 2023-12-27 MED ORDER — PREDNISOLONE ACETATE 1 % OP SUSP
1.0000 [drp] | Freq: Four times a day (QID) | OPHTHALMIC | 2 refills | Status: AC
  Filled 2023-12-27: qty 10, 25d supply, fill #0

## 2023-12-28 ENCOUNTER — Other Ambulatory Visit (HOSPITAL_COMMUNITY): Payer: Self-pay

## 2024-03-26 ENCOUNTER — Encounter (HOSPITAL_COMMUNITY): Payer: Self-pay

## 2024-03-26 ENCOUNTER — Emergency Department (HOSPITAL_COMMUNITY)
Admission: EM | Admit: 2024-03-26 | Discharge: 2024-03-27 | Disposition: A | Discharge status: Home / Self Care | Source: Ambulatory Visit | Attending: Emergency Medicine | Admitting: Emergency Medicine

## 2024-03-26 ENCOUNTER — Ambulatory Visit: Admitting: Internal Medicine

## 2024-03-26 ENCOUNTER — Other Ambulatory Visit

## 2024-03-26 ENCOUNTER — Emergency Department (HOSPITAL_COMMUNITY)

## 2024-03-26 ENCOUNTER — Inpatient Hospital Stay: Attending: Internal Medicine

## 2024-03-26 ENCOUNTER — Other Ambulatory Visit: Payer: Self-pay

## 2024-03-26 DIAGNOSIS — M6281 Muscle weakness (generalized): Secondary | ICD-10-CM | POA: Insufficient documentation

## 2024-03-26 DIAGNOSIS — Y92009 Unspecified place in unspecified non-institutional (private) residence as the place of occurrence of the external cause: Secondary | ICD-10-CM | POA: Insufficient documentation

## 2024-03-26 DIAGNOSIS — Z79899 Other long term (current) drug therapy: Secondary | ICD-10-CM | POA: Insufficient documentation

## 2024-03-26 DIAGNOSIS — E1122 Type 2 diabetes mellitus with diabetic chronic kidney disease: Secondary | ICD-10-CM | POA: Insufficient documentation

## 2024-03-26 DIAGNOSIS — C3411 Malignant neoplasm of upper lobe, right bronchus or lung: Secondary | ICD-10-CM | POA: Insufficient documentation

## 2024-03-26 DIAGNOSIS — Z85118 Personal history of other malignant neoplasm of bronchus and lung: Secondary | ICD-10-CM | POA: Insufficient documentation

## 2024-03-26 DIAGNOSIS — C3490 Malignant neoplasm of unspecified part of unspecified bronchus or lung: Secondary | ICD-10-CM

## 2024-03-26 DIAGNOSIS — D72829 Elevated white blood cell count, unspecified: Secondary | ICD-10-CM | POA: Insufficient documentation

## 2024-03-26 DIAGNOSIS — N189 Chronic kidney disease, unspecified: Secondary | ICD-10-CM | POA: Insufficient documentation

## 2024-03-26 DIAGNOSIS — I129 Hypertensive chronic kidney disease with stage 1 through stage 4 chronic kidney disease, or unspecified chronic kidney disease: Secondary | ICD-10-CM | POA: Insufficient documentation

## 2024-03-26 DIAGNOSIS — M1990 Unspecified osteoarthritis, unspecified site: Secondary | ICD-10-CM | POA: Insufficient documentation

## 2024-03-26 DIAGNOSIS — Z9181 History of falling: Secondary | ICD-10-CM | POA: Insufficient documentation

## 2024-03-26 DIAGNOSIS — R5383 Other fatigue: Secondary | ICD-10-CM | POA: Insufficient documentation

## 2024-03-26 DIAGNOSIS — J189 Pneumonia, unspecified organism: Secondary | ICD-10-CM | POA: Insufficient documentation

## 2024-03-26 DIAGNOSIS — Z794 Long term (current) use of insulin: Secondary | ICD-10-CM | POA: Insufficient documentation

## 2024-03-26 DIAGNOSIS — W19XXXA Unspecified fall, initial encounter: Secondary | ICD-10-CM | POA: Insufficient documentation

## 2024-03-26 DIAGNOSIS — Z923 Personal history of irradiation: Secondary | ICD-10-CM | POA: Insufficient documentation

## 2024-03-26 LAB — CBC WITH DIFFERENTIAL (CANCER CENTER ONLY)
Abs Immature Granulocytes: 0.05 K/uL (ref 0.00–0.07)
Basophils Absolute: 0 K/uL (ref 0.0–0.1)
Basophils Relative: 0 %
Eosinophils Absolute: 0 K/uL (ref 0.0–0.5)
Eosinophils Relative: 0 %
HCT: 24.5 % — ABNORMAL LOW (ref 39.0–52.0)
Hemoglobin: 8.4 g/dL — ABNORMAL LOW (ref 13.0–17.0)
Immature Granulocytes: 0 %
Lymphocytes Relative: 2 %
Lymphs Abs: 0.2 K/uL — ABNORMAL LOW (ref 0.7–4.0)
MCH: 32.2 pg (ref 26.0–34.0)
MCHC: 34.3 g/dL (ref 30.0–36.0)
MCV: 93.9 fL (ref 80.0–100.0)
Monocytes Absolute: 0.8 K/uL (ref 0.1–1.0)
Monocytes Relative: 6 %
Neutro Abs: 11.2 K/uL — ABNORMAL HIGH (ref 1.7–7.7)
Neutrophils Relative %: 92 %
Platelet Count: 259 K/uL (ref 150–400)
RBC: 2.61 MIL/uL — ABNORMAL LOW (ref 4.22–5.81)
RDW: 11.7 % (ref 11.5–15.5)
WBC Count: 12.3 K/uL — ABNORMAL HIGH (ref 4.0–10.5)
nRBC: 0 % (ref 0.0–0.2)

## 2024-03-26 LAB — CBC WITH DIFFERENTIAL/PLATELET
Abs Immature Granulocytes: 0.08 K/uL — ABNORMAL HIGH (ref 0.00–0.07)
Basophils Absolute: 0 K/uL (ref 0.0–0.1)
Basophils Relative: 0 %
Eosinophils Absolute: 0 K/uL (ref 0.0–0.5)
Eosinophils Relative: 0 %
HCT: 27.6 % — ABNORMAL LOW (ref 39.0–52.0)
Hemoglobin: 8.9 g/dL — ABNORMAL LOW (ref 13.0–17.0)
Immature Granulocytes: 1 %
Lymphocytes Relative: 2 %
Lymphs Abs: 0.4 K/uL — ABNORMAL LOW (ref 0.7–4.0)
MCH: 33.1 pg (ref 26.0–34.0)
MCHC: 32.2 g/dL (ref 30.0–36.0)
MCV: 102.6 fL — ABNORMAL HIGH (ref 80.0–100.0)
Monocytes Absolute: 1.2 K/uL — ABNORMAL HIGH (ref 0.1–1.0)
Monocytes Relative: 8 %
Neutro Abs: 13.6 K/uL — ABNORMAL HIGH (ref 1.7–7.7)
Neutrophils Relative %: 89 %
Platelets: 213 K/uL (ref 150–400)
RBC: 2.69 MIL/uL — ABNORMAL LOW (ref 4.22–5.81)
RDW: 11.9 % (ref 11.5–15.5)
WBC: 15.3 K/uL — ABNORMAL HIGH (ref 4.0–10.5)
nRBC: 0 % (ref 0.0–0.2)

## 2024-03-26 LAB — CMP (CANCER CENTER ONLY)
ALT: 6 U/L (ref 0–44)
AST: 24 U/L (ref 15–41)
Albumin: 3.7 g/dL (ref 3.5–5.0)
Alkaline Phosphatase: 122 U/L (ref 38–126)
Anion gap: 13 (ref 5–15)
BUN: 40 mg/dL — ABNORMAL HIGH (ref 8–23)
CO2: 19 mmol/L — ABNORMAL LOW (ref 22–32)
Calcium: 9.8 mg/dL (ref 8.9–10.3)
Chloride: 99 mmol/L (ref 98–111)
Creatinine: 2.81 mg/dL — ABNORMAL HIGH (ref 0.61–1.24)
GFR, Estimated: 23 mL/min — ABNORMAL LOW
Glucose, Bld: 338 mg/dL — ABNORMAL HIGH (ref 70–99)
Potassium: 4.4 mmol/L (ref 3.5–5.1)
Sodium: 132 mmol/L — ABNORMAL LOW (ref 135–145)
Total Bilirubin: 0.6 mg/dL (ref 0.0–1.2)
Total Protein: 7.1 g/dL (ref 6.5–8.1)

## 2024-03-26 LAB — URINALYSIS, ROUTINE W REFLEX MICROSCOPIC
Bilirubin Urine: NEGATIVE
Glucose, UA: >=500 mg/dL — AB
Hgb urine dipstick: NEGATIVE
Ketones, ur: NEGATIVE mg/dL
Leukocytes,Ua: NEGATIVE
Nitrite: NEGATIVE
Protein, ur: >=300 mg/dL — AB
Specific Gravity, Urine: 1.013 (ref 1.005–1.030)
pH: 5 (ref 5.0–8.0)

## 2024-03-26 NOTE — ED Provider Triage Note (Signed)
 Emergency Medicine Provider Triage Evaluation Note  Gene Fletcher , a 74 y.o. male  was evaluated in triage.  Pt complains of fall yesterday and requesting CT of his chest.  History of lung nodule, requesting CT scan of his chest for follow-up with his oncologist next week.  Family at bedside is concerned for increased weakness.  Notes a fall yesterday where he landed on his tailbone.  He was on the ground outside for over an hour before EMS could pick him up.  Family concerned about increased confusion and weakness.  Has not been able to ambulate today without assistance.  Patient notes no pain or concerns  Review of Systems  Positive: See above Negative: Headache, dizziness, syncope, chest pain, shortness of breath, abdominal pain  Physical Exam  BP 115/62 (BP Location: Right Arm)   Pulse 79   Temp 99 F (37.2 C)   Resp 18   Ht 5' 10 (1.778 m)   Wt 69.9 kg   SpO2 100%   BMI 22.10 kg/m  Gen:   Awake, no distress   Resp:  Normal effort  MSK:   Moves extremities without difficulty  Other:    Medical Decision Making  Medically screening exam initiated at 5:42 PM.  Appropriate orders placed.  Gene Fletcher was informed that the remainder of the evaluation will be completed by another provider, this initial triage assessment does not replace that evaluation, and the importance of remaining in the ED until their evaluation is complete.     Gene Fletcher, [place] 03/26/24 1744

## 2024-03-26 NOTE — ED Notes (Signed)
 Pt's family also requesting to speak with social work

## 2024-03-26 NOTE — ED Triage Notes (Signed)
 Patient states that he is having issues with mobility since he fell last night. He states that he fell in a seated position on concrete outside. Denies any pain.

## 2024-03-26 NOTE — ED Triage Notes (Signed)
 Pt had a fall last night, denies hitting his head, no LOC. Denied transport by EMS last night. Here today with c/o weakness and unable to walk.

## 2024-03-27 LAB — CBG MONITORING, ED: Glucose-Capillary: 199 mg/dL — ABNORMAL HIGH (ref 70–99)

## 2024-03-27 MED ORDER — BENZONATATE 100 MG PO CAPS: 100.0000 mg | ORAL_CAPSULE | Freq: Three times a day (TID) | ORAL | 0 refills | Status: AC

## 2024-03-27 MED ORDER — AMOXICILLIN 500 MG PO CAPS: 500.0000 mg | ORAL_CAPSULE | Freq: Three times a day (TID) | ORAL | 0 refills | Status: AC

## 2024-03-27 MED ORDER — INSULIN ASPART 100 UNIT/ML IJ SOLN
5.0000 [IU] | Freq: Once | INTRAMUSCULAR | Status: AC
  Administered 2024-03-27: 5 [IU] via SUBCUTANEOUS
  Filled 2024-03-27: qty 5

## 2024-03-27 MED ORDER — BENZONATATE 100 MG PO CAPS: 100.0000 mg | ORAL_CAPSULE | Freq: Three times a day (TID) | ORAL | 0 refills | Status: DC

## 2024-03-27 MED ORDER — AMOXICILLIN 500 MG PO CAPS: 500.0000 mg | ORAL_CAPSULE | Freq: Three times a day (TID) | ORAL | 0 refills | Status: DC

## 2024-03-27 MED ORDER — DOXYCYCLINE HYCLATE 100 MG PO CAPS: 100.0000 mg | ORAL_CAPSULE | Freq: Two times a day (BID) | ORAL | 0 refills | Status: DC

## 2024-03-27 MED ORDER — DOXYCYCLINE HYCLATE 100 MG PO CAPS: 100.0000 mg | ORAL_CAPSULE | Freq: Two times a day (BID) | ORAL | 0 refills | Status: AC

## 2024-03-27 NOTE — ED Provider Notes (Signed)
 " Gene Fletcher EMERGENCY DEPARTMENT AT [facility] Provider Note   CSN: [id number] Arrival date & time: 03/26/24  1402     Patient presents with: No chief complaint on file.   Gene Fletcher is a 74 y.o. male.   The history is provided by the patient, medical records and a relative. No language interpreter was used.     74 year old male with history of hypertension, diabetes, hyperlipidemia, chronic kidney disease, lung cancer brought here by private vehicle with complaint of a fall.  Patient was brought in by his brother who lives out of town in [place] .  Per brother, several days ago patient had a fall landing on his bottom, and apparently he was on the ground for about an hour and when EMS came to help him up, patient refused to go to the hospital and family was concerned.  Patient however states that he did fell on his buttock and his leg got caught underneath him and when they tried to get him up, his leg was asleep and he was having some trouble at that time however since then he is able to move around more fluidly.  He normally walks unassisted but think he may benefit from using a cane.  He denies hitting his head or loss of consciousness.  He did mention that he have history of lung cancer and is scheduled to follow-up with his oncologist in the near future.  However,, patient states he needs to have CT scan of his chest done before hand's before he can be seen by his provider and he is very grateful that CT scan was done during this visit.  Aside from his usual because patient does not endorse any increased shortness of breath, fever, chills, nausea vomiting diarrhea.  He has normal bowel movement.  He is not on any blood thinner medication.  He denies any confusion.  He lives at home with his wife who has dementia.  His oncologist is Dr. Gatha.  Patient states that he was told that his cancer went into remission 6 months ago and he is scheduled for 8-month follow-up in the  next few days.  Prior to Admission medications  Medication Sig Start Date End Date Taking? Authorizing Provider  amLODipine  (NORVASC ) 5 MG tablet Take 5 mg by mouth daily. 06/22/22   [provider]  atropine  1 % ophthalmic solution Place 1 drop into both eyes daily. 12/27/23     Continuous Glucose Sensor (FREESTYLE LIBRE 3 PLUS SENSOR) MISC once a week.    [provider]  insulin  aspart protamine- aspart (NOVOLOG  MIX 70/30) (70-30) 100 UNIT/ML injection Inject 15-20 Units into the skin See admin instructions. Inject 20 units in the morning and 15 units at bedtime.    [provider]  prednisoLONE  acetate (PRED FORTE ) 1 % ophthalmic suspension Place 1 drop into both eyes 4 (four) times daily. 12/27/23     tadalafil  (CIALIS ) 5 MG tablet Take 1 tablet (5 mg total) by mouth daily as needed. 11/29/23       Allergies: Flexeril  [cyclobenzaprine ]    Review of Systems  All other systems reviewed and are negative.   Updated Vital Signs BP 131/65 (BP Location: Left Arm)   Pulse 79   Temp 99 F (37.2 C)   Resp 17   Ht 5' 10 (1.778 m)   Wt 69.9 kg   SpO2 100%   BMI 22.10 kg/m   Physical Exam Constitutional:      General: He is  not in acute distress.    Appearance: He is well-developed.  HENT:     Head: Atraumatic.  Eyes:     Conjunctiva/sclera: Conjunctivae normal.  Cardiovascular:     Rate and Rhythm: Normal rate and regular rhythm.     Pulses: Normal pulses.     Heart sounds: Normal heart sounds.  Pulmonary:     Comments: Diminished lung sounds without wheezes rales or rhonchi heard Abdominal:     Palpations: Abdomen is soft.     Tenderness: There is no abdominal tenderness.  Musculoskeletal:     Cervical back: Normal range of motion and neck supple.     Comments: 5 out of 5 strength in all 4 extremities with equal effort  Skin:    Findings: No rash.  Neurological:     Mental Status: He is alert and oriented to person, place, and time.     (all  labs ordered are listed, but only abnormal results are displayed) Labs Reviewed  CBC WITH DIFFERENTIAL/PLATELET - Abnormal; Notable for the following components:      Result Value   WBC 15.3 (*)    RBC 2.69 (*)    Hemoglobin 8.9 (*)    HCT 27.6 (*)    MCV 102.6 (*)    Neutro Abs 13.6 (*)    Lymphs Abs 0.4 (*)    Monocytes Absolute 1.2 (*)    Abs Immature Granulocytes 0.08 (*)    All other components within normal limits  URINALYSIS, ROUTINE W REFLEX MICROSCOPIC - Abnormal; Notable for the following components:   APPearance HAZY (*)    Glucose, UA >=500 (*)    Protein, ur >=300 (*)    Bacteria, UA FEW (*)    All other components within normal limits  COMPREHENSIVE METABOLIC PANEL WITH GFR    EKG: None  Radiology: CT CHEST ABDOMEN PELVIS WO CONTRAST Result Date: 03/26/2024 EXAM: CT CHEST, ABDOMEN AND PELVIS WITHOUT CONTRAST 03/26/2024 06:23:00 PM TECHNIQUE: CT of the chest, abdomen and pelvis was performed without the administration of intravenous contrast. Multiplanar reformatted images are provided for review. Automated exposure control, iterative reconstruction, and/or weight based adjustment of the mA/kV was utilized to reduce the radiation dose to as low as reasonably achievable. COMPARISON: Chest radiograph 10/16/2023, PET CT 07/03/2023, chest CT 09/14/2023, and CT abdomen and pelvis 06/23/2022. CLINICAL HISTORY: Fall, previous lung nodule. FINDINGS: CHEST: MEDIASTINUM AND LYMPH NODES: Normal heart size. No pericardial effusions. Minimal coronary artery calcification. The central airways are clear. Mediastinal lymphadenopathy is again demonstrated without significant change since prior study. No hilar or axillary lymphadenopathy. Normal caliber thoracic aorta. Minimal aortic calcification. LUNGS AND PLEURA: Focal area of ground glass consolidation demonstrated in the right perihilar region and several focal areas of ground glass opacities in the right axilla. Infiltrates in the right  perihilar region are new since the prior study. Right apical changes have progressed. Multiple pulmonary nodules are demonstrated diffusely throughout the lungs. The largest is in the right apex, series 5 image 30, measuring 6 mm diameter. This is increased in size since the previous study. Other nodules appear similar in size to the prior study. No pleural effusion or pneumothorax. ABDOMEN AND PELVIS: LIVER: Liver is normal. GALLBLADDER AND BILE DUCTS: Gallbladder is normal. No biliary ductal dilatation. SPLEEN: Spleen is normal. PANCREAS: Pancreas is normal. ADRENAL GLANDS: Adrenal glands are normal. KIDNEYS, URETERS AND BLADDER: Kidneys, ureters, and the bladder are normal. No stones in the kidneys or ureters. No hydronephrosis. No perinephric or periureteral stranding. GI  AND BOWEL: Stomach is decompressed. Small bowel is unremarkable. The appendix is normal. Large stool-filled rectum with mild rectal wall thickening, possibly perirectal colitis. Remainder of the colon is also stool filled. There is no bowel obstruction. REPRODUCTIVE ORGANS: The prostate gland is not enlarged. PERITONEUM AND RETROPERITONEUM: No ascites. No free air. VASCULATURE: Calcification of the aorta without aneurysm. ABDOMINAL AND PELVIS LYMPH NODES: Prominent celiac axis lymph nodes measuring up to 13 mm short axis dimension, similar to prior study. BONES AND SOFT TISSUES: No acute osseous abnormality. No focal soft tissue abnormality. See additional report of PET CT. IMPRESSION: 1. Multiple pulmonary nodules, largest in the right apex measuring 6 mm, increased in size since the previous study; other nodules appear similar in size to the prior study. 2. New right perihilar ground glass consolidation and progression of right apical changes. 3. Mediastinal lymphadenopathy without significant change since prior study. 4. Large stool-filled rectum with mild rectal wall thickening, possibly stercoral colitis. Electronically signed by: Elsie Gravely MD 03/26/2024 06:57 PM EST RP Workstation: [id number]   CT Cervical Spine Wo Contrast Result Date: 03/26/2024 EXAM: CT CERVICAL SPINE WITHOUT CONTRAST 03/26/2024 06:23:00 PM TECHNIQUE: CT of the cervical spine was performed without the administration of intravenous contrast. Multiplanar reformatted images are provided for review. Automated exposure control, iterative reconstruction, and/or weight based adjustment of the mA/kV was utilized to reduce the radiation dose to as low as reasonably achievable. COMPARISON: MRI cervical spine 06/06/2023. CLINICAL HISTORY: Fall. FINDINGS: BONES AND ALIGNMENT: No acute fracture or traumatic malalignment. DEGENERATIVE CHANGES: Mild cervical spondylosis. Central disc protrusion at C4-C5 resulting in mild spinal stenosis, similar to the prior MRI. SOFT TISSUES: No prevertebral soft tissue swelling. IMPRESSION: 1. No acute cervical spine fracture or traumatic malalignment. Electronically signed by: Dasie Hamburg MD 03/26/2024 06:55 PM EST RP Workstation: [id number]   CT Head Wo Contrast Result Date: 03/26/2024 EXAM: CT HEAD WITHOUT CONTRAST 03/26/2024 06:23:00 PM TECHNIQUE: CT of the head was performed without the administration of intravenous contrast. Automated exposure control, iterative reconstruction, and/or weight based adjustment of the mA/kV was utilized to reduce the radiation dose to as low as reasonably achievable. COMPARISON: Head CT 03/19/2022 and MRI 03/01/2023. CLINICAL HISTORY: fall FINDINGS: BRAIN AND VENTRICLES: There is no evidence of an acute infarct, intracranial hemorrhage, mass, midline shift, hydrocephalus, or extra-axial fluid collection. Mild cerebral atrophy is within normal limits for age. Patchy hypodensities in the cerebral white matter are slightly more prominent than on the prior CT and are nonspecific but compatible with mild to moderate chronic small vessel ischemic disease. 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 Calcified atherosclerosis at the skull base. ORBITS: Chronic bilateral exophthalmos. SINUSES: Small mucous retention cysts in the maxillary sinuses. Clear mastoid air cells. SOFT TISSUES AND SKULL: No acute soft tissue abnormality. No skull fracture. IMPRESSION: 1. No acute intracranial abnormality. 2. Mild to moderate chronic small vessel ischemic disease. Electronically signed by: Dasie Hamburg MD 03/26/2024 06:52 PM EST RP Workstation: [id number]     Procedures   Medications Ordered in the ED - No data to display                                  Medical Decision Making  BP 131/65 (BP Location: Left Arm)   Pulse 79   Temp 99 F (37.2 C)   Resp 17   Ht 5' 10 (1.778 m)  Wt 69.9 kg   SpO2 100%   BMI 22.10 kg/m   50:81 AM  74 year old male with history of hypertension, diabetes, hyperlipidemia, chronic kidney disease, lung cancer brought here by private vehicle with complaint of a fall.  Patient was brought in by his brother who lives out of town in [place] .  Per brother, several days ago patient had a fall landing on his bottom, and apparently he was on the ground for about an hour and when EMS came to help him up, patient refused to go to the hospital and family was concerned.  Patient however states that he did fell on his buttock and his leg got caught underneath him and when they tried to get him up, his leg was asleep and he was having some trouble at that time however since then he is able to move around more fluidly.  He normally walks unassisted but think he may benefit from using a cane.  He denies hitting his head or loss of consciousness.  He did mention that he have history of lung cancer and is scheduled to follow-up with his oncologist in the near future.  However,, patient states he needs to have CT scan of his chest done before hand's before he can be seen by his provider and he is very grateful that CT scan was done during this visit.  Aside from his  usual because patient does not endorse any increased shortness of breath, fever, chills, nausea vomiting diarrhea.  He has normal bowel movement.  He is not on any blood thinner medication.  He denies any confusion.  He lives at home with his wife who has dementia.  His oncologist is Dr. Gatha.  Patient states that he was told that his cancer went into remission 6 months ago and he is scheduled for 38-month follow-up in the next few days.  On exam patient is resting comfortably in no acute discomfort.  Patient is alert and oriented x 4.  Heart with normal rate and rhythm, lungs with slightly diminished lung sounds but no wheezes rales or rhonchi heard.  Abdomen is soft nontender, strength equal throughout, no tenderness along midline spine on palpation.  No signs of trauma.  -Labs ordered, independently viewed and interpreted by me.  Labs remarkable for UA without signs of UTI.  Patient does have an elevated White count of 15.3, his hemoglobin is 8.9 -The patient was maintained on a cardiac monitor.  I personally viewed and interpreted the cardiac monitored which showed an underlying rhythm of: Sinus rhythm -Imaging independently viewed and interpreted by me and I agree with radiologist's interpretation.  Result remarkable for CT scan of the head and C-spine without any acute injury.  CT scan of the chest abdomen pelvis demonstrating multiple pulmonary nodules as well as a new right perihilar ground glass consolidation and mention of large stool-filled rectum with mild rectal wall thickening and possibly stercoral colitis.  This is patient does endorse a cough and has an oral temperature of 99 as well as has leukocytosis, will treat for suspected pneumonia with antibiotic.  He is not hypoxic and does not meet admission criteria.  Patient does not exhibit symptoms of constipation as he report having bowel movement and denies having any rectal pain. -This patient presents to the ED for concern of fall, this  involves an extensive number of treatment options, and is a complaint that carries with it a high risk of complications and morbidity.  The differential diagnosis includes fracture, dislocation, strain,  sprain, contusion, infection, metabolic derangement, stroke -Co morbidities that complicate the patient evaluation includes lung cancer, diabetes -Treatment includes reassurance -Reevaluation of the patient after these medicines showed that the patient improved -PCP office notes or outside notes reviewed -Discussion with attending Dr. Pamella -Escalation to admission/observation considered: patients feels much better, is comfortable with discharge, and will follow up with PCP -Prescription medication considered, patient comfortable with home meds -Social Determinant of Health considered   Appreciate help from L social worker team who will help provide patient with resources for family to hide home caregivers as patient does not meet criteria for SNF or hospital admission at this time.  Patient able to ambulate without assist.  He feels comfortable going home.     Final diagnoses:  Fall in home, initial encounter  Pneumonia of right lung due to infectious organism, unspecified part of lung    ED Discharge Orders          Ordered    amoxicillin  (AMOXIL ) 500 MG capsule  3 times daily        03/27/24 1227    doxycycline  (VIBRAMYCIN ) 100 MG capsule  2 times daily        03/27/24 1227    benzonatate  (TESSALON ) 100 MG capsule  Every 8 hours        03/27/24 1227               Nivia Colon, PA-C 03/27/24 1228    Pamella Ozell LABOR, DO 03/30/24 0732  "

## 2024-03-27 NOTE — Progress Notes (Addendum)
 CSW met w/ pt and pt family (dtr and brother) at bedside to discuss dc planning. Pt noted to be confused and resides at home w/ wife who carries a dementia diagnosis. Family is visiting from out of state to assist w/ dc planning. Safety concerns identified regarding pt returning home given both pt and spouse have cognitive impairment and limited ability to care for self independently. CSW provided education and resources for private-pay in-home caregiving services. CSW advised that pt does not meet criteria for SNF placement or continued hospital admission at this time and will need to plan for dc home. Dtr is actively coordinating caregiving resources to establish a safe dc plan.

## 2024-03-27 NOTE — ED Notes (Addendum)
 Pt refusing blood draw at this time. Educated pt on the need for labs but he continues to refuse. Secure chat sent to provider to make him aware of pt refusal.

## 2024-03-27 NOTE — ED Notes (Signed)
 Pt ambulated with ed tech with a steady gait.

## 2024-03-27 NOTE — Discharge Instructions (Addendum)
 You have been evaluated for your symptoms.  CT scan today shows possible pneumonia on the right side of your lungs as well as some increased growth of your lung cancer.  Follow-up closely with your cancer doctor as previously scheduled for further management.  Take antibiotics as prescribed for your pneumonia.  Our social worker will be in contact to help you find help with home health needs.  Return to ER if you have any concern.

## 2024-04-02 ENCOUNTER — Inpatient Hospital Stay: Admitting: Internal Medicine

## 2024-04-02 VITALS — BP 129/65 | HR 80 | Temp 98.2°F | Resp 17 | Ht 70.0 in | Wt 145.0 lb

## 2024-04-02 DIAGNOSIS — C3490 Malignant neoplasm of unspecified part of unspecified bronchus or lung: Secondary | ICD-10-CM | POA: Diagnosis not present

## 2024-04-02 NOTE — Progress Notes (Signed)
 "     [facility] [phone]) [phone]   [phone]  OFFICE PROGRESS NOTE  Gene Hamilton, MD [address] [place] [address]  DIAGNOSIS: Stage IA (T1c, N0, M0) non-small cell lung cancer, adenocarcinoma presented with right upper lobe lung nodule in addition to suspicious right lower lobe nodule with atypical cells diagnosed in July 2025.   PRIOR THERAPY: Status post curative SBRT to the right upper lobe and right lower lobe lung nodules under the care of Dr. Dewey completed on December 11, 2023.  CURRENT THERAPY: Observation.  INTERVAL HISTORY: Gene Fletcher 74 y.o. male returns to the clinic today for follow-up visit. Discussed the use of AI scribe software for clinical note transcription with the patient, who gave verbal consent to proceed.  History of Present Illness Gene Fletcher is a 74 year old male with stage 1A right lung adenocarcinoma, status post curative SBRT, who presents for surveillance and restaging imaging.  He was diagnosed with stage 1A right lung adenocarcinoma in July 2025, with right upper lobe and multiple right lower lobe nodules. He completed curative stereotactic body radiation therapy to these nodules in September 2025 and is currently under observation without ongoing cancer-directed therapy.  Recent CT imaging of the chest, abdomen, and pelvis performed last week for restaging demonstrated persistent small pulmonary nodules in addition to the previously treated sites. He is asymptomatic from a pulmonary standpoint and denies new respiratory symptoms.  A head CT scan performed last week was unremarkable. He denies new neurological symptoms, including cognitive changes, and has not experienced recent falls. He reports subjective weakness, and his brother has noted concerns regarding his balance. He denies numbness or tingling in his legs, though he acknowledges that his diabetes may affect sensation and  balance.  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 He denies foot wounds or infections. He recently discontinued sleeping in socks.     MEDICAL HISTORY: Past Medical History:  Diagnosis Date   Arthritis    Diabetes mellitus without complication (HCC)    Hyperlipidemia    Hypertension     ALLERGIES:  is allergic to flexeril  [cyclobenzaprine ].  MEDICATIONS:  Current Outpatient Medications  Medication Sig Dispense Refill   amLODipine  (NORVASC ) 5 MG tablet Take 5 mg by mouth daily.     amoxicillin  (AMOXIL ) 500 MG capsule Take 1 capsule (500 mg total) by mouth 3 (three) times daily. 21 capsule 0   atropine  1 % ophthalmic solution Place 1 drop into both eyes daily. 5 mL 2   benzonatate  (TESSALON ) 100 MG capsule Take 1 capsule (100 mg total) by mouth every 8 (eight) hours. 21 capsule 0   Continuous Glucose Sensor (FREESTYLE LIBRE 3 PLUS SENSOR) MISC once a week.     doxycycline  (VIBRAMYCIN ) 100 MG capsule Take 1 capsule (100 mg total) by mouth 2 (two) times daily. 20 capsule 0   insulin  aspart protamine- aspart (NOVOLOG  MIX 70/30) (70-30) 100 UNIT/ML injection Inject 15-20 Units into the skin See admin instructions. Inject 20 units in the morning and 15 units at bedtime.     prednisoLONE  acetate (PRED FORTE ) 1 % ophthalmic suspension Place 1 drop into both eyes 4 (four) times daily. 10 mL 2   tadalafil  (CIALIS ) 5 MG tablet Take 1 tablet (5 mg total) by mouth daily as needed. 30 tablet 0   No current facility-administered medications for this visit.    SURGICAL HISTORY:  Past Surgical History:  Procedure Laterality Date   COLONOSCOPY  VIDEO BRONCHOSCOPY WITH ENDOBRONCHIAL NAVIGATION Right 10/16/2023   Procedure: VIDEO BRONCHOSCOPY WITH ENDOBRONCHIAL NAVIGATION;  Surgeon: Shelah Lamar RAMAN, MD;  Location: [facility] ENDOSCOPY;  Service: Pulmonary;  Laterality: Right;   VIDEO BRONCHOSCOPY WITH ENDOBRONCHIAL ULTRASOUND Right 10/16/2023   Procedure: BRONCHOSCOPY, WITH EBUS;   Surgeon: Shelah Lamar RAMAN, MD;  Location: [facility] ENDOSCOPY;  Service: Pulmonary;  Laterality: Right;    REVIEW OF SYSTEMS:  A comprehensive review of systems was negative except for: Constitutional: positive for fatigue Musculoskeletal: positive for muscle weakness   PHYSICAL EXAMINATION: General appearance: alert, cooperative, fatigued, and no distress Head: Normocephalic, without obvious abnormality, atraumatic Neck: no adenopathy, no JVD, supple, symmetrical, trachea midline, and thyroid  not enlarged, symmetric, no tenderness/mass/nodules Lymph nodes: Cervical, supraclavicular, and axillary nodes normal. Resp: clear to auscultation bilaterally Back: symmetric, no curvature. ROM normal. No CVA tenderness. Cardio: regular rate and rhythm, S1, S2 normal, no murmur, click, rub or gallop GI: soft, non-tender; bowel sounds normal; no masses,  no organomegaly Extremities: extremities normal, atraumatic, no cyanosis or edema  ECOG PERFORMANCE STATUS: 1 - Symptomatic but completely ambulatory  Blood pressure 129/65, pulse 80, temperature 98.2 F (36.8 C), temperature source Temporal, resp. rate 17, height 5' 10 (1.778 m), weight 145 lb (65.8 kg), SpO2 99%.  LABORATORY DATA: Lab Results  Component Value Date   WBC 15.3 (H) 03/26/2024   HGB 8.9 (L) 03/26/2024   HCT 27.6 (L) 03/26/2024   MCV 102.6 (H) 03/26/2024   PLT 213 03/26/2024      Chemistry      Component Value Date/Time   NA 132 (L) 03/26/2024 1006   K 4.4 03/26/2024 1006   CL 99 03/26/2024 1006   CO2 19 (L) 03/26/2024 1006   BUN 40 (H) 03/26/2024 1006   CREATININE 2.81 (H) 03/26/2024 1006      Component Value Date/Time   CALCIUM 9.8 03/26/2024 1006   ALKPHOS 122 03/26/2024 1006   AST 24 03/26/2024 1006   ALT 6 03/26/2024 1006   BILITOT 0.6 03/26/2024 1006       RADIOGRAPHIC STUDIES: CT CHEST ABDOMEN PELVIS WO CONTRAST Result Date: 03/26/2024 EXAM: CT CHEST, ABDOMEN AND PELVIS WITHOUT CONTRAST 03/26/2024 06:23:00 PM  TECHNIQUE: CT of the chest, abdomen and pelvis was performed without the administration of intravenous contrast. Multiplanar reformatted images are provided for review. Automated exposure control, iterative reconstruction, and/or weight based adjustment of the mA/kV was utilized to reduce the radiation dose to as low as reasonably achievable. COMPARISON: Chest radiograph 10/16/2023, PET CT 07/03/2023, chest CT 09/14/2023, and CT abdomen and pelvis 06/23/2022. CLINICAL HISTORY: Fall, previous lung nodule. FINDINGS: CHEST: MEDIASTINUM AND LYMPH NODES: Normal heart size. No pericardial effusions. Minimal coronary artery calcification. The central airways are clear. Mediastinal lymphadenopathy is again demonstrated without significant change since prior study. No hilar or axillary lymphadenopathy. Normal caliber thoracic aorta. Minimal aortic calcification. LUNGS AND PLEURA: Focal area of ground glass consolidation demonstrated in the right perihilar region and several focal areas of ground glass opacities in the right axilla. Infiltrates in the right perihilar region are new since the prior study. Right apical changes have progressed. Multiple pulmonary nodules are demonstrated diffusely throughout the lungs. The largest is in the right apex, series 5 image 30, measuring 6 mm diameter. This is increased in size since the previous study. Other nodules appear similar in size to the prior study. No pleural effusion or pneumothorax. ABDOMEN AND PELVIS: LIVER: Liver is normal. GALLBLADDER AND BILE DUCTS: Gallbladder is normal. No biliary ductal dilatation. SPLEEN:  Spleen is normal. PANCREAS: Pancreas is normal. ADRENAL GLANDS: Adrenal glands are normal. KIDNEYS, URETERS AND BLADDER: Kidneys, ureters, and the bladder are normal. No stones in the kidneys or ureters. No hydronephrosis. No perinephric or periureteral stranding. GI AND BOWEL: Stomach is decompressed. Small bowel is unremarkable. The appendix is normal. Large  stool-filled rectum with mild rectal wall thickening, possibly perirectal colitis. Remainder of the colon is also stool filled. There is no bowel obstruction. REPRODUCTIVE ORGANS: The prostate gland is not enlarged. PERITONEUM AND RETROPERITONEUM: No ascites. No free air. VASCULATURE: Calcification of the aorta without aneurysm. ABDOMINAL AND PELVIS LYMPH NODES: Prominent celiac axis lymph nodes measuring up to 13 mm short axis dimension, similar to prior study. BONES AND SOFT TISSUES: No acute osseous abnormality. No focal soft tissue abnormality. See additional report of PET CT. IMPRESSION: 1. Multiple pulmonary nodules, largest in the right apex measuring 6 mm, increased in size since the previous study; other nodules appear similar in size to the prior study. 2. New right perihilar ground glass consolidation and progression of right apical changes. 3. Mediastinal lymphadenopathy without significant change since prior study. 4. Large stool-filled rectum with mild rectal wall thickening, possibly stercoral colitis. Electronically signed by: Elsie Gravely MD 03/26/2024 06:57 PM EST RP Workstation: [id number]   CT Cervical Spine Wo Contrast Result Date: 03/26/2024 EXAM: CT CERVICAL SPINE WITHOUT CONTRAST 03/26/2024 06:23:00 PM TECHNIQUE: CT of the cervical spine was performed without the administration of intravenous contrast. Multiplanar reformatted images are provided for review. Automated exposure control, iterative reconstruction, and/or weight based adjustment of the mA/kV was utilized to reduce the radiation dose to as low as reasonably achievable. COMPARISON: MRI cervical spine 06/06/2023. CLINICAL HISTORY: Fall. FINDINGS: BONES AND ALIGNMENT: No acute fracture or traumatic malalignment. DEGENERATIVE CHANGES: Mild cervical spondylosis. Central disc protrusion at C4-C5 resulting in mild spinal stenosis, similar to the prior MRI. SOFT TISSUES: No prevertebral soft tissue swelling. IMPRESSION: 1. No acute  cervical spine fracture or traumatic malalignment. Electronically signed by: Dasie Hamburg MD 03/26/2024 06:55 PM EST RP Workstation: [id number]   CT Head Wo Contrast Result Date: 03/26/2024 EXAM: CT HEAD WITHOUT CONTRAST 03/26/2024 06:23:00 PM TECHNIQUE: CT of the head was performed without the administration of intravenous contrast. Automated exposure control, iterative reconstruction, and/or weight based adjustment of the mA/kV was utilized to reduce the radiation dose to as low as reasonably achievable. COMPARISON: Head CT 03/19/2022 and MRI 03/01/2023. CLINICAL HISTORY: fall FINDINGS: BRAIN AND VENTRICLES: There is no evidence of an acute infarct, intracranial hemorrhage, mass, midline shift, hydrocephalus, or extra-axial fluid collection. Mild cerebral atrophy is within normal limits for age. Patchy hypodensities in the cerebral white matter are slightly more prominent than on the prior CT and are nonspecific but compatible with mild to moderate chronic small vessel ischemic disease. 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 Calcified atherosclerosis at the skull base. ORBITS: Chronic bilateral exophthalmos. SINUSES: Small mucous retention cysts in the maxillary sinuses. Clear mastoid air cells. SOFT TISSUES AND SKULL: No acute soft tissue abnormality. No skull fracture. IMPRESSION: 1. No acute intracranial abnormality. 2. Mild to moderate chronic small vessel ischemic disease. Electronically signed by: Dasie Hamburg MD 03/26/2024 06:52 PM EST RP Workstation: [id number]    ASSESSMENT AND PLAN: This is a very pleasant 74 years old African-American male with Stage IA (T1c, N0, M0) non-small cell lung cancer, adenocarcinoma presented with right upper lobe lung nodule in addition to suspicious right lower lobe nodule with atypical  cells diagnosed in July 2025.  He is status post curative SBRT to the right upper lobe and right lower lobe lung nodules under the care of  Dr. Dewey completed on December 11, 2023. The patient is currently on observation.  He had repeat CT scan of the chest, abdomen and pelvis performed recently.  I personally and independently reviewed the scan and discussed the result with the patient today.  His scan showed multiple pulmonary nodules largest in the right apex measuring 6 mm increased in size since the prior exam.  The other nodules appear similar.  There was also new right perihilar ground glass glassed consolidation progression of the right apical changes and stable mediastinal lymphadenopathy. Assessment and Plan Assessment & Plan Stage IA right lung adenocarcinoma, status post radiation therapy He previously received curative stereotactic body radiation therapy to right upper and lower lobe nodules. Current imaging reveals persistent small pulmonary nodules suspicious for malignancy, not amenable to further radiation due to size. He is asymptomatic from a pulmonary perspective and remains under surveillance. - Continued observation without immediate intervention for residual nodules. - Ordered repeat CT of chest, abdomen, and pelvis in three months for disease surveillance. - Advised to return for evaluation if additional falls occur, as this may necessitate brain MRI to assess for intracranial pathology.  Type 2 diabetes mellitus with diabetic neuropathy Diabetes with associated neuropathy likely contributes to instability and increased fall risk. He denies current numbness or paresthesias but reports weakness and prior falls. - Encouraged to seek medical attention if falls recur or neuropathy symptoms worsen. The patient was advised to call immediately if she has any other concerning symptoms in the interval. The patient voices understanding of current disease status and treatment options and is in agreement with the current care plan.  All questions were answered. The patient knows to call the clinic with any problems,  questions or concerns. We can certainly see the patient much sooner if necessary.  The total time spent in the appointment was 20 minutes including review of chart and various tests results, discussions about plan of care and coordination of care plan .   Disclaimer: This note was dictated with voice recognition software. Similar sounding words can inadvertently be transcribed and may not be corrected upon review.        "

## 2024-04-03 ENCOUNTER — Encounter (HOSPITAL_COMMUNITY): Payer: Self-pay

## 2024-04-03 ENCOUNTER — Emergency Department (HOSPITAL_COMMUNITY)

## 2024-04-03 ENCOUNTER — Inpatient Hospital Stay (HOSPITAL_COMMUNITY)
Admission: EM | Admit: 2024-04-03 | Source: Home / Self Care | Attending: Internal Medicine | Admitting: Internal Medicine

## 2024-04-03 ENCOUNTER — Emergency Department (HOSPITAL_COMMUNITY)
Admit: 2024-04-03 | Discharge: 2024-04-03 | Disposition: A | Discharge status: Home / Self Care | Attending: Orthopedic Surgery | Admitting: Orthopedic Surgery

## 2024-04-03 ENCOUNTER — Other Ambulatory Visit: Payer: Self-pay

## 2024-04-03 DIAGNOSIS — E785 Hyperlipidemia, unspecified: Secondary | ICD-10-CM | POA: Diagnosis not present

## 2024-04-03 DIAGNOSIS — Z531 Procedure and treatment not carried out because of patient's decision for reasons of belief and group pressure: Secondary | ICD-10-CM

## 2024-04-03 DIAGNOSIS — D649 Anemia, unspecified: Secondary | ICD-10-CM | POA: Diagnosis present

## 2024-04-03 DIAGNOSIS — L089 Local infection of the skin and subcutaneous tissue, unspecified: Secondary | ICD-10-CM | POA: Diagnosis present

## 2024-04-03 DIAGNOSIS — I10 Essential (primary) hypertension: Secondary | ICD-10-CM | POA: Diagnosis present

## 2024-04-03 DIAGNOSIS — E1110 Type 2 diabetes mellitus with ketoacidosis without coma: Secondary | ICD-10-CM | POA: Diagnosis not present

## 2024-04-03 DIAGNOSIS — E119 Type 2 diabetes mellitus without complications: Secondary | ICD-10-CM

## 2024-04-03 DIAGNOSIS — I96 Gangrene, not elsewhere classified: Secondary | ICD-10-CM

## 2024-04-03 DIAGNOSIS — N184 Chronic kidney disease, stage 4 (severe): Secondary | ICD-10-CM | POA: Diagnosis not present

## 2024-04-03 DIAGNOSIS — C3411 Malignant neoplasm of upper lobe, right bronchus or lung: Secondary | ICD-10-CM | POA: Diagnosis present

## 2024-04-03 DIAGNOSIS — E11628 Type 2 diabetes mellitus with other skin complications: Secondary | ICD-10-CM

## 2024-04-03 LAB — URINALYSIS, W/ REFLEX TO CULTURE (INFECTION SUSPECTED)
Bacteria, UA: NONE SEEN
Bilirubin Urine: NEGATIVE
Glucose, UA: >=500 mg/dL — AB
Ketones, ur: 5 mg/dL — AB
Leukocytes,Ua: NEGATIVE
Nitrite: NEGATIVE
Protein, ur: 100 mg/dL — AB
Specific Gravity, Urine: 1.018 (ref 1.005–1.030)
pH: 5 (ref 5.0–8.0)

## 2024-04-03 LAB — CBC WITH DIFFERENTIAL/PLATELET
Abs Immature Granulocytes: 0.13 K/uL — ABNORMAL HIGH (ref 0.00–0.07)
Basophils Absolute: 0 K/uL (ref 0.0–0.1)
Basophils Relative: 0 %
Eosinophils Absolute: 0 K/uL (ref 0.0–0.5)
Eosinophils Relative: 0 %
HCT: 30.4 % — ABNORMAL LOW (ref 39.0–52.0)
Hemoglobin: 9.8 g/dL — ABNORMAL LOW (ref 13.0–17.0)
Immature Granulocytes: 1 %
Lymphocytes Relative: 2 %
Lymphs Abs: 0.3 K/uL — ABNORMAL LOW (ref 0.7–4.0)
MCH: 32.1 pg (ref 26.0–34.0)
MCHC: 32.2 g/dL (ref 30.0–36.0)
MCV: 99.7 fL (ref 80.0–100.0)
Monocytes Absolute: 1 K/uL (ref 0.1–1.0)
Monocytes Relative: 5 %
Neutro Abs: 16.9 K/uL — ABNORMAL HIGH (ref 1.7–7.7)
Neutrophils Relative %: 92 %
Platelets: 391 K/uL (ref 150–400)
RBC: 3.05 MIL/uL — ABNORMAL LOW (ref 4.22–5.81)
RDW: 12.3 % (ref 11.5–15.5)
WBC: 18.3 K/uL — ABNORMAL HIGH (ref 4.0–10.5)
nRBC: 0 % (ref 0.0–0.2)

## 2024-04-03 LAB — I-STAT VENOUS BLOOD GAS, ED
Acid-Base Excess: 2 mmol/L (ref 0.0–2.0)
Bicarbonate: 26.7 mmol/L (ref 20.0–28.0)
Calcium, Ion: 1.28 mmol/L (ref 1.15–1.40)
HCT: 24 % — ABNORMAL LOW (ref 39.0–52.0)
Hemoglobin: 8.2 g/dL — ABNORMAL LOW (ref 13.0–17.0)
O2 Saturation: 52 %
Potassium: 3.9 mmol/L (ref 3.5–5.1)
Sodium: 147 mmol/L — ABNORMAL HIGH (ref 135–145)
TCO2: 28 mmol/L (ref 22–32)
pCO2, Ven: 43.4 mmHg — ABNORMAL LOW (ref 44–60)
pH, Ven: 7.397 (ref 7.25–7.43)
pO2, Ven: 28 mmHg — CL (ref 32–45)

## 2024-04-03 LAB — COMPREHENSIVE METABOLIC PANEL WITH GFR
ALT: 24 U/L (ref 0–44)
AST: 16 U/L (ref 15–41)
Albumin: 3.2 g/dL — ABNORMAL LOW (ref 3.5–5.0)
Alkaline Phosphatase: 149 U/L — ABNORMAL HIGH (ref 38–126)
Anion gap: 20 — ABNORMAL HIGH (ref 5–15)
BUN: 66 mg/dL — ABNORMAL HIGH (ref 8–23)
CO2: 20 mmol/L — ABNORMAL LOW (ref 22–32)
Calcium: 9.9 mg/dL (ref 8.9–10.3)
Chloride: 105 mmol/L (ref 98–111)
Creatinine, Ser: 2.55 mg/dL — ABNORMAL HIGH (ref 0.61–1.24)
GFR, Estimated: 26 mL/min — ABNORMAL LOW
Glucose, Bld: 497 mg/dL — ABNORMAL HIGH (ref 70–99)
Potassium: 4.3 mmol/L (ref 3.5–5.1)
Sodium: 145 mmol/L (ref 135–145)
Total Bilirubin: 0.5 mg/dL (ref 0.0–1.2)
Total Protein: 7.4 g/dL (ref 6.5–8.1)

## 2024-04-03 LAB — CBG MONITORING, ED
Glucose-Capillary: 441 mg/dL — ABNORMAL HIGH (ref 70–99)
Glucose-Capillary: 383 mg/dL — ABNORMAL HIGH (ref 70–99)
Glucose-Capillary: 328 mg/dL — ABNORMAL HIGH (ref 70–99)
Glucose-Capillary: 223 mg/dL — ABNORMAL HIGH (ref 70–99)
Glucose-Capillary: 161 mg/dL — ABNORMAL HIGH (ref 70–99)
Glucose-Capillary: 143 mg/dL — ABNORMAL HIGH (ref 70–99)
Glucose-Capillary: 148 mg/dL — ABNORMAL HIGH (ref 70–99)
Glucose-Capillary: 181 mg/dL — ABNORMAL HIGH (ref 70–99)

## 2024-04-03 LAB — I-STAT CG4 LACTIC ACID, ED
Lactic Acid, Venous: 2.3 mmol/L (ref 0.5–1.9)
Lactic Acid, Venous: 1.9 mmol/L (ref 0.5–1.9)

## 2024-04-03 LAB — C-REACTIVE PROTEIN: CRP: 21.2 mg/dL — ABNORMAL HIGH

## 2024-04-03 LAB — HEMOGLOBIN A1C
Hgb A1c MFr Bld: 9.2 % — ABNORMAL HIGH (ref 4.8–5.6)
Mean Plasma Glucose: 217.34 mg/dL

## 2024-04-03 LAB — BETA-HYDROXYBUTYRIC ACID
Beta-Hydroxybutyric Acid: 0.46 mmol/L — ABNORMAL HIGH (ref 0.05–0.27)
Beta-Hydroxybutyric Acid: 3.6 mmol/L — ABNORMAL HIGH (ref 0.05–0.27)

## 2024-04-03 LAB — VAS US ABI WITH/WO TBI
Left ABI: 0.54
Right ABI: 0.98

## 2024-04-03 MED ORDER — LACTATED RINGERS IV BOLUS
20.0000 mL/kg | Freq: Once | INTRAVENOUS | Status: AC
  Administered 2024-04-03: 1000 mL via INTRAVENOUS

## 2024-04-03 MED ORDER — PIPERACILLIN-TAZOBACTAM 3.375 G IVPB 30 MIN
3.3750 g | Freq: Once | INTRAVENOUS | Status: AC
  Administered 2024-04-03: 3.375 g via INTRAVENOUS
  Filled 2024-04-03: qty 50

## 2024-04-03 MED ORDER — METRONIDAZOLE 500 MG/100ML IV SOLN
500.0000 mg | Freq: Two times a day (BID) | INTRAVENOUS | Status: AC
  Administered 2024-04-03 – 2024-04-08 (×11): 500 mg via INTRAVENOUS
  Filled 2024-04-03 (×11): qty 100

## 2024-04-03 MED ORDER — SODIUM CHLORIDE 0.9 % IV SOLN
2.0000 g | INTRAVENOUS | Status: AC
  Administered 2024-04-03 – 2024-04-08 (×6): 2 g via INTRAVENOUS
  Filled 2024-04-03 (×6): qty 12.5

## 2024-04-03 MED ORDER — AMLODIPINE BESYLATE 5 MG PO TABS
5.0000 mg | ORAL_TABLET | Freq: Every day | ORAL | Status: DC
  Administered 2024-04-04: 5 mg via ORAL
  Filled 2024-04-03: qty 1

## 2024-04-03 MED ORDER — POTASSIUM CHLORIDE 10 MEQ/100ML IV SOLN
10.0000 meq | INTRAVENOUS | Status: AC
  Administered 2024-04-03 (×2): 10 meq via INTRAVENOUS
  Filled 2024-04-03 (×2): qty 100

## 2024-04-03 MED ORDER — PREDNISOLONE ACETATE 1 % OP SUSP
1.0000 [drp] | Freq: Four times a day (QID) | OPHTHALMIC | Status: DC
  Administered 2024-04-04 – 2024-04-16 (×48): 1 [drp] via OPHTHALMIC
  Filled 2024-04-03: qty 5

## 2024-04-03 MED ORDER — DEXTROSE 50 % IV SOLN: 0.0000 mL | INTRAVENOUS | Status: DC | PRN

## 2024-04-03 MED ORDER — VANCOMYCIN HCL IN DEXTROSE 1-5 GM/200ML-% IV SOLN: 1000.0000 mg | Freq: Once | INTRAVENOUS | Status: DC

## 2024-04-03 MED ORDER — VANCOMYCIN HCL IN DEXTROSE 1-5 GM/200ML-% IV SOLN: 1000.0000 mg | INTRAVENOUS | Status: DC

## 2024-04-03 MED ORDER — DEXTROSE IN LACTATED RINGERS 5 % IV SOLN: INTRAVENOUS | Status: DC

## 2024-04-03 MED ORDER — ATROPINE SULFATE 1 % OP SOLN
1.0000 [drp] | Freq: Every day | OPHTHALMIC | Status: DC
  Administered 2024-04-05 – 2024-04-16 (×11): 1 [drp] via OPHTHALMIC
  Filled 2024-04-03: qty 2

## 2024-04-03 MED ORDER — LACTATED RINGERS IV SOLN: INTRAVENOUS | Status: DC

## 2024-04-03 MED ORDER — VANCOMYCIN HCL 1500 MG/300ML IV SOLN
1500.0000 mg | Freq: Once | INTRAVENOUS | Status: AC
  Administered 2024-04-03: 1500 mg via INTRAVENOUS
  Filled 2024-04-03: qty 300

## 2024-04-03 MED ORDER — INSULIN REGULAR(HUMAN) IN NACL 100-0.9 UT/100ML-% IV SOLN
INTRAVENOUS | Status: DC
  Administered 2024-04-03: 9 [IU]/h via INTRAVENOUS
  Filled 2024-04-03: qty 100

## 2024-04-03 NOTE — Progress Notes (Signed)
 Pharmacy Antibiotic Note  Gene Fletcher is a 74 y.o. male admitted on 04/03/2024 presenting with foot wound, concern for infection/osteomyelitis.  Pharmacy has been consulted for vancomycin  dosing.  Vancomycin  1500 mg IV x 1 given in ED  Plan: Vancomycin  1g IV q 48h (eAUC 439) Monitor renal function, Cx/Ortho recs and clinical progression to narrow Vancomycin  levels as indicated       Temp (24hrs), Avg:98.2 F (36.8 C), Min:98 F (36.7 C), Max:98.4 F (36.9 C)  Recent Labs  Lab 04/03/24 1039 04/03/24 1046 04/03/24 1205  WBC 18.3*  --   --   CREATININE 2.55*  --   --   LATICACIDVEN  --  2.3* 1.9    Estimated Creatinine Clearance: 23.7 mL/min (A) (by C-G formula based on SCr of 2.55 mg/dL (H)).    Allergies[1]  Dorn Poot, PharmD, [facility] Clinical Pharmacist ED Pharmacist Phone # [phone] 04/03/2024 2:55 PM      [1]  Allergies Allergen Reactions   Flexeril  [Cyclobenzaprine ] Other (See Comments)    [place]. Hallucinations

## 2024-04-03 NOTE — ED Triage Notes (Signed)
 Pt. Has a right foot pain , has ulcers and odor

## 2024-04-03 NOTE — H&P (Addendum)
 " History and Physical   Gene Fletcher:[id number] DOB: Aug 21, 1950 DOA: 04/03/2024  PCP: [name], MD   Patient coming from: Home  Chief Complaint: Foot wound  HPI: Gene Fletcher is a 74 y.o. male with medical history significant of hypertension, hyperlipidemia, diabetes, CKD 4, lung cancer status post radiation, anemia presenting for chronic foot ulcer.  Patient presents with concern for foot wound.  Brother who lives in [place] also reports concern about possible developing dementia.  Patient has had a foot wound for the past 6 months.  Has had it in a sock for the past 6 weeks.  Now has become malodorous with significant drainage and discoloration of the toes.  Patient was noted to have elevated white blood cell count when he presented to the ED after a fall 8 days ago.  Urinalysis looked okay and CT showed possible ground glass opacities the patient was started on antibiotics for pneumonia with amoxicillin  and doxycycline .  Issues with foot wound were not reported at that time.  Brother reports concern about dementia or possible stroke as he has been holding his arm closer to his body in the right upper extremity and has issues to ambulation.  At recent oncology visit there was concern for possible undiagnosed diabetic neuropathy causing issues with patient's ambulation.  Patient does not have any pain with his foot ulcer increasing likelihood of neuropathy/nerve damage.  Note; patient is Jehovah's Witness and does not accept blood products.  Patient denies fevers, chills, chest pain, shortness of breath, abdominal pain, constipation, diarrhea, nausea, vomiting.  ED Course: Vital signs in the ED notable for blood pressure in the 120s-160s systolic.  Lab workup included CMP with potassium 4.3, bicarb 20, gap 20, BUN 66, creatinine 2.55 near baseline of 2.3, glucose 497.  CBC with leukocytosis to 18.3, hemoglobin stable at 9.8.  Albumin 3.2, alk phos 149.  Lactic acid  initially 2.3, but improved on repeat.  Chest x-ray showed right midlung density consistent with nodules on recent CT.  Right foot x-ray pending.  ABI pending.  Patient started on vancomycin  and Zosyn  in the ED.  Orthopedic surgery consulted and will follow the patient in our checking ABI to evaluate if vascular surgery intervention will be needed as well.  Review of Systems: As per HPI otherwise all other systems reviewed and are negative.  Past Medical History:  Diagnosis Date   Arthritis    Diabetes mellitus without complication (HCC)    Hyperlipidemia    Hypertension     Past Surgical History:  Procedure Laterality Date   COLONOSCOPY     VIDEO BRONCHOSCOPY WITH ENDOBRONCHIAL NAVIGATION Right 10/16/2023   Procedure: VIDEO BRONCHOSCOPY WITH ENDOBRONCHIAL NAVIGATION;  Surgeon: Shelah Lamar RAMAN, MD;  Location: [facility] ENDOSCOPY;  Service: Pulmonary;  Laterality: Right;   VIDEO BRONCHOSCOPY WITH ENDOBRONCHIAL ULTRASOUND Right 10/16/2023   Procedure: BRONCHOSCOPY, WITH EBUS;  Surgeon: Shelah Lamar RAMAN, MD;  Location: [facility] ENDOSCOPY;  Service: Pulmonary;  Laterality: Right;    Social History  reports that he has never smoked. He has been exposed to tobacco smoke. He has never used smokeless tobacco. He reports that he does not currently use alcohol  after a past usage of about 2.0 standard drinks of alcohol  per week. He reports that he does not use drugs.  Allergies[1]  Family History  Problem Relation Age of Onset   Diabetes Mother    Diabetes Father    Lung cancer Father        smoker   Diabetes  Sister    Diabetes Brother    Colon polyps Neg Hx    Colon cancer Neg Hx    Rectal cancer Neg Hx    Stomach cancer Neg Hx    Esophageal cancer Neg Hx   Reviewed on admission  Prior to Admission medications  Medication Sig Start Date End Date Taking? Authorizing Provider  amLODipine  (NORVASC ) 5 MG tablet Take 5 mg by mouth daily. 06/22/22   [provider]  amoxicillin  (AMOXIL ) 500 MG  capsule Take 1 capsule (500 mg total) by mouth 3 (three) times daily. 03/27/24   Nivia Colon, PA-C  atropine  1 % ophthalmic solution Place 1 drop into both eyes daily. 12/27/23     benzonatate  (TESSALON ) 100 MG capsule Take 1 capsule (100 mg total) by mouth every 8 (eight) hours. 03/27/24   Nivia Colon, PA-C  doxycycline  (VIBRAMYCIN ) 100 MG capsule Take 1 capsule (100 mg total) by mouth 2 (two) times daily. 03/27/24   Nivia Colon, PA-C  insulin  aspart protamine - aspart (NOVOLOG  MIX 70/30 FLEXPEN) (70-30) 100 UNIT/ML FlexPen Inject 20-25 Units into the skin See admin instructions. Inject 25 units into the skin with breakfast and 20 units with dinner.    [provider]  prednisoLONE  acetate (PRED FORTE ) 1 % ophthalmic suspension Place 1 drop into both eyes 4 (four) times daily. 12/27/23     tadalafil  (CIALIS ) 5 MG tablet Take 1 tablet (5 mg total) by mouth daily as needed. 11/29/23       Physical Exam: Vitals:   04/03/24 0907 04/03/24 1253 04/03/24 1400  BP: 133/71 (!) 165/71 (!) 163/76  Pulse: 81 84 74  Resp: 19 18   Temp: 98 F (36.7 C) 98.4 F (36.9 C)   SpO2: 100% 100% 100%    Physical Exam Constitutional:      General: He is not in acute distress.    Appearance: Normal appearance.  HENT:     Head: Normocephalic and atraumatic.     Mouth/Throat:     Mouth: Mucous membranes are moist.     Pharynx: Oropharynx is clear.  Eyes:     Extraocular Movements: Extraocular movements intact.     Pupils: Pupils are equal, round, and reactive to light.  Cardiovascular:     Rate and Rhythm: Normal rate and regular rhythm.     Pulses: Normal pulses.     Heart sounds: Normal heart sounds.  Pulmonary:     Effort: Pulmonary effort is normal. No respiratory distress.     Breath sounds: Normal breath sounds.  Abdominal:     General: Bowel sounds are normal. There is no distension.     Palpations: Abdomen is soft.     Tenderness: There is no abdominal tenderness.  Musculoskeletal:         General: No swelling or deformity.     Comments: Malodorous drainage from right lower extremity with evidence of ischemia/gangrene of R toes 2-4/5.  Skin:    General: Skin is warm and dry.  Neurological:     General: No focal deficit present.     Mental Status: Mental status is at baseline.    Labs on Admission: I have personally reviewed following labs and imaging studies  CBC: Recent Labs  Lab 04/03/24 1039  WBC 18.3*  NEUTROABS 16.9*  HGB 9.8*  HCT 30.4*  MCV 99.7  PLT 391    Basic Metabolic Panel: Recent Labs  Lab 04/03/24 1039  NA 145  K 4.3  CL 105  CO2 20*  GLUCOSE 497*  BUN 66*  CREATININE 2.55*  CALCIUM 9.9    GFR: Estimated Creatinine Clearance: 23.7 mL/min (A) (by C-G formula based on SCr of 2.55 mg/dL (H)).  Liver Function Tests: Recent Labs  Lab 04/03/24 1039  AST 16  ALT 24  ALKPHOS 149*  BILITOT 0.5  PROT 7.4  ALBUMIN 3.2*    Urine analysis:    Component Value Date/Time   COLORURINE YELLOW 03/26/2024 1801   APPEARANCEUR HAZY (A) 03/26/2024 1801   LABSPEC 1.013 03/26/2024 1801   PHURINE 5.0 03/26/2024 1801   GLUCOSEU >=500 (A) 03/26/2024 1801   HGBUR NEGATIVE 03/26/2024 1801   BILIRUBINUR NEGATIVE 03/26/2024 1801   KETONESUR NEGATIVE 03/26/2024 1801   PROTEINUR >=300 (A) 03/26/2024 1801   NITRITE NEGATIVE 03/26/2024 1801   LEUKOCYTESUR NEGATIVE 03/26/2024 1801    Radiological Exams on Admission: VAS US  ABI WITH/WO TBI Result Date: 04/03/2024  LOWER EXTREMITY DOPPLER STUDY Patient Name:  IMRAN NUON  Date of Exam:   04/03/2024 Medical Rec #: [id number]            Accession #:    [id number] Date of Birth: Aug 23, 1950            Patient Gender: M Patient Age:   31 years Exam Location:  [facility] Procedure:      VAS US  ABI WITH/WO TBI Referring Phys: [name] JEFFERY --------------------------------------------------------------------------------  Indications: Gangrene. High Risk Factors: Hypertension, hyperlipidemia,  Diabetes.  Limitations: Today's exam was limited due to an open wound, involuntary patient              movement and Left upper arm IV. Comparison Study: No prior studies. Performing Technologist: Gerome Ny RVT  Examination Guidelines: A complete evaluation includes at minimum, Doppler waveform signals and systolic blood pressure reading at the level of bilateral brachial, anterior tibial, and posterior tibial arteries, when vessel segments are accessible. Bilateral testing is considered an integral part of a complete examination. Photoelectric Plethysmograph (PPG) waveforms and toe systolic pressure readings are included as required and additional duplex testing as needed. Limited examinations for reoccurring indications may be performed as noted.  ABI Findings: +--------+------------------+-----+-----------+--------+ Right   Rt Pressure (mmHg)IndexWaveform   Comment  +--------+------------------+-----+-----------+--------+ [id number]                    triphasic           +--------+------------------+-----+-----------+--------+ PTA     151               0.98 multiphasic         +--------+------------------+-----+-----------+--------+ DP      86                0.56 monophasic          +--------+------------------+-----+-----------+--------+ +--------+------------------+-----+----------+-------+ Left    Lt Pressure (mmHg)IndexWaveform  Comment +--------+------------------+-----+----------+-------+ Brachial                                 IV      +--------+------------------+-----+----------+-------+ PTA     83                0.54 monophasic        +--------+------------------+-----+----------+-------+ DP                             absent            +--------+------------------+-----+----------+-------+ +-------+-----------+-----------+------------+------------+  ABI/TBIToday's ABIToday's TBIPrevious ABIPrevious TBI  +-------+-----------+-----------+------------+------------+ Right  0.98                                           +-------+-----------+-----------+------------+------------+ Left   0.54                                           +-------+-----------+-----------+------------+------------+  Spoke with Dr. Harden who states toe pressures are not necessary.  Summary: Right: Resting right ankle-brachial index is within normal range.  ABI is likely falsely elevated due to medial calcification. 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  *See table(s) above for measurements and observations.     Preliminary    DG Foot 2 Views Right Result Date: 04/03/2024 CLINICAL DATA:  Necrosis. EXAM: RIGHT FOOT - 2 VIEW COMPARISON:  None Available. FINDINGS: Mid and forefoot soft tissue gas. No definite underlying osseous erosion to suggest osteomyelitis. IMPRESSION: Mid and forefoot necrotizing fasciitis. No definitive evidence of underlying osteomyelitis. Electronically Signed   By: Newell Eke M.D.   On: 04/03/2024 14:31   DG Chest 2 View Result Date: 04/03/2024 CLINICAL DATA:  Infection EXAM: CHEST - 2 VIEW COMPARISON:  October 16, 2023.  March 26, 2024. FINDINGS: The heart size and mediastinal contours are within normal limits. Right midlung nodular density is noted which corresponds to the nodule described on recent CT scan. Left lung is clear. The visualized skeletal structures are unremarkable. IMPRESSION: Right midlung nodular density is noted which corresponds to the nodule described on recent CT scan. Electronically Signed   By: Lynwood Landy Raddle M.D.   On: 04/03/2024 11:25   EKG: Not performed in emergency department   Assessment/Plan Principal Problem:   Diabetic foot infection (HCC) Active Problems:   Type 2 diabetes mellitus without complications (HCC)   Hyperlipidemia   Essential hypertension   Chronic kidney disease, stage 4 (severe)  (HCC)   Anemia   Malignant 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)   Gangrene of right foot (HCC)   No transfusions per religious beliefs (Jehovahs Witnesses)   DKA (diabetic ketoacidosis) (HCC)   Diabetic foot infection Gangrene Foot wound ?  Osteomyelitis > Patient presenting with worsening foot wound for the past 6 months.  Has been a side for the past 6 weeks.  Now with malodorous drainage. > Gangrenous in appearance with malodorous drainage with concern for possible osteomyelitis in the setting of diabetic foot infection. > Received vancomycin  and Zosyn  in the ED.  Orthopedic surgery consulted and have seen, they are checking ABIs and considering vascular surgery involvement. - Monitor in progressive unit overnight - Appreciate orthopedic surgery recommendations and assistance - Continue with vancomycin : transition Zosyn  to Cefepime  and Flagyl  given CKD 4 - Trend fever curve and WBC - IV fluids as below - Follow-up right foot x-ray and ABI - Supportive care  DKA Diabetes > No history of diabetes.  Presenting with diabetic foot infection as above with labs consistent with developing DKA in the setting of this infection. > Glucose 497, bicarb 20, gap 20, lactic acid initially mildly elevated and then normal with minimal intervention. - Monitor on progressive unit as above - Start on insulin  drip - Potassium supplementation 20 mEq IV - LR at 125 mL/hr  until CBG less than 250 - Switch to D5-LR when 1 CBG less than 250 - Nothing by mouth  - BMET every 4 hours - CBG Q1H - Once anion gap closed 2, start CM diet and if able to eat, administer Lantus  15 units - Continue insulin  drip for 1-2 more hours, then discontinue and start SSI-S  - DC fluids if eating, drinking, and off insulin  drip  Hypertension - Continue home amlodipine   Lung cancer > Status post radiation.  Continues on observation with oncology  Anemia Refuses blood products > Hemoglobin stable 9.8.  Patient  does refuse blood products as a Jehovah's Witness. - Continue to trend CBC  ?Memory deficits > Family has expressed some concerns about possible developing dementia.  Patient's wife has dementia as well.  Concerned that he would not be able to care for himself and there is no one to care for him at home. - Will affect discharge - Will end up needing further testing outpatient  DVT prophylaxis: SCDs for now Code Status:   Full Family Communication:  Daughter updated by phone.   Disposition Plan:   Patient is from:  Home  Anticipated DC to:  Pending clinical course  Anticipated DC date:  Pending: Course  Anticipated DC barriers: No to help care for him at home currently, issues with medication/treatment adherence and memory at home per family.   Consults called:  Orthopedic surgery Admission status:  Inpatient, progressive  Severity of Illness: The appropriate patient status for this patient is INPATIENT. Inpatient status is judged to be reasonable and necessary in order to provide the required intensity of service to ensure the patient's safety. The patient's presenting symptoms, physical exam findings, and initial radiographic and laboratory data in the context of their chronic comorbidities is felt to place them at high risk for further clinical deterioration. Furthermore, it is not anticipated that the patient will be medically stable for discharge from the hospital within 2 midnights of admission.   * I certify that at the point of admission it is my clinical judgment that the patient will require inpatient hospital care spanning beyond 2 midnights from the point of admission due to high intensity of service, high risk for further deterioration and high frequency of surveillance required.DEWAINE Marsa KATHEE Seena MD [facility]  How to contact the [facility] Attending or Consulting provider 7A - 7P or covering provider during after hours 7P -7A, for this patient?   Check the care team  in [facility] and look for a) attending/consulting [facility] provider listed and b) the [facility] team listed Log into [email or web address] and use [facility]'s universal password to access. If you do not have the password, please contact the hospital operator. Locate the [facility] provider you are looking for under [facility] and page to a number that you can be directly reached. If you still have difficulty reaching the provider, please page the [facility] (Director on Call) for the Hospitalists listed on amion for assistance.  04/03/2024, 3:44 PM       [1]  Allergies Allergen Reactions   Flexeril  [Cyclobenzaprine ] Other (See Comments)    [place]. Hallucinations    "

## 2024-04-03 NOTE — ED Notes (Signed)
 Phlebotomy to collect outstanding labs.

## 2024-04-03 NOTE — Inpatient Diabetes Management (Signed)
 Inpatient Diabetes Program Recommendations  AACE/ADA: New Consensus Statement on Inpatient Glycemic Control (2015)  Target Ranges:  Prepandial:   less than 140 mg/dL      Peak postprandial:   less than 180 mg/dL (1-2 hours)      Critically ill patients:  140 - 180 mg/dL   Lab Results  Component Value Date   GLUCAP 199 (H) 03/27/2024    Review of Glycemic Control  Latest Reference Range & Units 04/03/24 10:39  Glucose 70 - 99 mg/dL 502 (H)  (H): Data is abnormally high  Diabetes history: DM2 Outpatient Diabetes medications: 70/30 25 units QAM and 20 units QPM Current orders for Inpatient glycemic control: none  Inpatient Diabetes Program Recommendations:    Semglee  15 units every day Novolog  0-15 units Q4H  Thank you, Wyvonna Pinal, MSN, CDCES Diabetes Coordinator Inpatient Diabetes Program [phone] (team pager from 8a-5p)

## 2024-04-03 NOTE — Progress Notes (Signed)
 ABI's have been completed. Preliminary results can be found in CV Proc through chart review.  Results were given to Dr. Harden.  04/03/2024 2:52 PM Cathlyn Collet RVT

## 2024-04-03 NOTE — Progress Notes (Signed)
 ED Pharmacy Antibiotic Sign Off An antibiotic consult was received from an ED provider for zosyn  and vancomycin  per pharmacy dosing for wound infection. A chart review was completed to assess appropriateness.   The following one time order(s) were placed:  Vancomycin  1500mg  Zosyn  3.375g over 30 minutes  Further antibiotic and/or antibiotic pharmacy consults should be ordered by the admitting provider if indicated.   Thank you for allowing pharmacy to be a part of this patient's care.   Leonor GORMAN Bash, [facility]  Clinical Pharmacist 04/03/2024 11:32 AM

## 2024-04-03 NOTE — Consult Note (Signed)
 Reason for Consult:Right foot gangrene Referring Physician: Lamar Salen Time called: 1122 Time at bedside: 1140   Gene Fletcher is an 74 y.o. male.  HPI: Gene Fletcher was brought in by his brother for right foot infection. It's unclear how long it's been going on. Pt says he's been wearing a sock on that foot for 6 weeks and that's where the smell is coming from. He denies fevers, chills, sweats, N/V though brother says he has been vomiting. He lives alone.  Past Medical History:  Diagnosis Date   Arthritis    Diabetes mellitus without complication (HCC)    Hyperlipidemia    Hypertension     Past Surgical History:  Procedure Laterality Date   COLONOSCOPY     VIDEO BRONCHOSCOPY WITH ENDOBRONCHIAL NAVIGATION Right 10/16/2023   Procedure: VIDEO BRONCHOSCOPY WITH ENDOBRONCHIAL NAVIGATION;  Surgeon: Shelah Lamar RAMAN, MD;  Location: [facility] ENDOSCOPY;  Service: Pulmonary;  Laterality: Right;   VIDEO BRONCHOSCOPY WITH ENDOBRONCHIAL ULTRASOUND Right 10/16/2023   Procedure: BRONCHOSCOPY, WITH EBUS;  Surgeon: Shelah Lamar RAMAN, MD;  Location: [facility] ENDOSCOPY;  Service: Pulmonary;  Laterality: Right;    Family History  Problem Relation Age of Onset   Diabetes Mother    Diabetes Father    Lung cancer Father        smoker   Diabetes Sister    Diabetes Brother    Colon polyps Neg Hx    Colon cancer Neg Hx    Rectal cancer Neg Hx    Stomach cancer Neg Hx    Esophageal cancer Neg Hx     Social History:  reports that he has never smoked. He has been exposed to tobacco smoke. He has never used smokeless tobacco. He reports that he does not currently use alcohol  after a past usage of about 2.0 standard drinks of alcohol  per week. He reports that he does not use drugs.  Allergies: Allergies[1]  Medications: I have reviewed the patient's current medications.  Results for orders placed or performed during the hospital encounter of 04/03/24 (from the past 48 hours)  Comprehensive metabolic panel      Status: Abnormal   Collection Time: 04/03/24 10:39 AM  Result Value Ref Range   Sodium 145 135 - 145 mmol/L   Potassium 4.3 3.5 - 5.1 mmol/L   Chloride 105 98 - 111 mmol/L   CO2 20 (L) 22 - 32 mmol/L   Glucose, Bld 497 (H) 70 - 99 mg/dL    Comment: Glucose reference range applies only to samples taken after fasting for at least 8 hours.   BUN 66 (H) 8 - 23 mg/dL   Creatinine, Ser 7.44 (H) 0.61 - 1.24 mg/dL   Calcium 9.9 8.9 - 89.6 mg/dL   Total Protein 7.4 6.5 - 8.1 g/dL   Albumin 3.2 (L) 3.5 - 5.0 g/dL   AST 16 15 - 41 U/L   ALT 24 0 - 44 U/L   Alkaline Phosphatase 149 (H) 38 - 126 U/L   Total Bilirubin 0.5 0.0 - 1.2 mg/dL   GFR, Estimated 26 (L) >60 mL/min    Comment: (NOTE) Calculated using the CKD-EPI Creatinine Equation (2021)    Anion gap 20 (H) 5 - 15    Comment: Performed at [facility], [address] [place], [place] [address]  CBC with Differential     Status: Abnormal   Collection Time: 04/03/24 10:39 AM  Result Value Ref Range   WBC 18.3 (H) 4.0 - 10.5 K/uL   RBC  3.05 (L) 4.22 - 5.81 MIL/uL   Hemoglobin 9.8 (L) 13.0 - 17.0 g/dL   HCT 69.5 (L) 60.9 - 47.9 %   MCV 99.7 80.0 - 100.0 fL   MCH 32.1 26.0 - 34.0 pg   MCHC 32.2 30.0 - 36.0 g/dL   RDW 87.6 88.4 - 84.4 %   Platelets 391 150 - 400 K/uL   nRBC 0.0 0.0 - 0.2 %   Neutrophils Relative % 92 %   Neutro Abs 16.9 (H) 1.7 - 7.7 K/uL   Lymphocytes Relative 2 %   Lymphs Abs 0.3 (L) 0.7 - 4.0 K/uL   Monocytes Relative 5 %   Monocytes Absolute 1.0 0.1 - 1.0 K/uL   Eosinophils Relative 0 %   Eosinophils Absolute 0.0 0.0 - 0.5 K/uL   Basophils Relative 0 %   Basophils Absolute 0.0 0.0 - 0.1 K/uL   Immature Granulocytes 1 %   Abs Immature Granulocytes 0.13 (H) 0.00 - 0.07 K/uL    Comment: Performed at [facility], [address], [place], [place] [address]  I-Stat Lactic Acid, ED     Status: Abnormal   Collection Time: 04/03/24 10:46 AM  Result Value Ref Range   Lactic Acid, Venous 2.3 (HH) 0.5 -  1.9 mmol/L   Comment NOTIFIED PHYSICIAN   I-Stat Lactic Acid, ED     Status: None   Collection Time: 04/03/24 12:05 PM  Result Value Ref Range   Lactic Acid, Venous 1.9 0.5 - 1.9 mmol/L    DG Chest 2 View Result Date: 04/03/2024 CLINICAL DATA:  Infection EXAM: CHEST - 2 VIEW COMPARISON:  October 16, 2023.  March 26, 2024. FINDINGS: The heart size and mediastinal contours are within normal limits. Right midlung nodular density is noted which corresponds to the nodule described on recent CT scan. Left lung is clear. The visualized skeletal structures are unremarkable. IMPRESSION: Right midlung nodular density is noted which corresponds to the nodule described on recent CT scan. Electronically Signed   By: Lynwood Landy Raddle M.D.   On: 04/03/2024 11:25    Review of Systems  Constitutional:  Negative for chills, diaphoresis and fever.  HENT:  Negative for ear discharge, ear pain, hearing loss and tinnitus.   Eyes:  Negative for photophobia and pain.  Respiratory:  Negative for cough and shortness of breath.   Cardiovascular:  Negative for chest pain.  Gastrointestinal:  Positive for vomiting. Negative for abdominal pain and nausea.  Genitourinary:  Negative for dysuria, flank pain, frequency and urgency.  Musculoskeletal:  Negative for back pain, myalgias and neck pain.  Neurological:  Negative for dizziness and headaches.  Hematological:  Does not bruise/bleed easily.  Psychiatric/Behavioral:  The patient is not nervous/anxious.    Blood pressure 133/71, pulse 81, temperature 98 F (36.7 C), resp. rate 19, SpO2 100%. Physical Exam Constitutional:      General: He is not in acute distress.    Appearance: He is well-developed. He is not diaphoretic.  HENT:     Head: Normocephalic and atraumatic.  Eyes:     General: No scleral icterus.       Right eye: No discharge.        Left eye: No discharge.     Conjunctiva/sclera: Conjunctivae normal.  Cardiovascular:     Rate and Rhythm: Normal  rate and regular rhythm.  Pulmonary:     Effort: Pulmonary effort is normal. No respiratory distress.  Musculoskeletal:     Cervical back: Normal range of motion.  Feet:     Comments: LLE No traumatic wounds, ecchymosis, or rash  Gangrene toes 2-5 w/malodor  No ankle effusion  Sens DPN, SPN, TN intact  Motor EHL, ext, flex, evers 5/5  DP 0, PT 0, No significant edema Skin:    General: Skin is warm and dry.  Neurological:     Mental Status: He is alert.  Psychiatric:        Mood and Affect: Mood normal.        Behavior: Behavior normal.     Assessment/Plan: Right foot gangrene -- Will get ABI's; if abnormal will need vascular surgery consult. Dr. Harden to evaluate later today or in AM.    Gene DOROTHA Ned, PA-C Orthopedic Surgery [phone] 04/03/2024, 12:16 PM     [1]  Allergies Allergen Reactions   Flexeril  [Cyclobenzaprine ] Other (See Comments)    [place]. Hallucinations

## 2024-04-03 NOTE — Consult Note (Signed)
 "   ORTHOPAEDIC CONSULTATION  REQUESTING PHYSICIAN: Seena Marsa NOVAK, MD  Chief Complaint: Gangrene right midfoot and forefoot  HPI: Gene Fletcher is a 74 y.o. 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  Patient states that it has been present for months.  Patient states he does not smoke , has diabetes   Past Medical History:  Diagnosis Date   Arthritis    Diabetes mellitus without complication (HCC)    Hyperlipidemia    Hypertension    Past Surgical History:  Procedure Laterality Date   COLONOSCOPY     VIDEO BRONCHOSCOPY WITH ENDOBRONCHIAL NAVIGATION Right 10/16/2023   Procedure: VIDEO BRONCHOSCOPY WITH ENDOBRONCHIAL NAVIGATION;  Surgeon: Shelah Lamar RAMAN, MD;  Location: [facility] ENDOSCOPY;  Service: Pulmonary;  Laterality: Right;   VIDEO BRONCHOSCOPY WITH ENDOBRONCHIAL ULTRASOUND Right 10/16/2023   Procedure: BRONCHOSCOPY, WITH EBUS;  Surgeon: Shelah Lamar RAMAN, MD;  Location: [facility] ENDOSCOPY;  Service: Pulmonary;  Laterality: Right;   Social History   Socioeconomic History   Marital status: Married    Spouse name: Not on file   Number of children: 1   Years of education: Not on file   Highest education level: Not on file  Occupational History   Not on file  Tobacco Use   Smoking status: Never    Passive exposure: Past   Smokeless tobacco: Never  Vaping Use   Vaping status: Never Used  Substance and Sexual Activity   Alcohol  use: Not Currently    Alcohol /week: 2.0 standard drinks of alcohol     Types: 2 Standard drinks or equivalent per week   Drug use: No   Sexual activity: Not on file  Other Topics Concern   Not on file  Social History Narrative   Retired- [employer]      Right handed    Wear glasses    Drinks green tea daily    Social Drivers of Health   Tobacco Use: Low Risk (04/03/2024)   Patient History    Smoking Tobacco Use: Never    Smokeless Tobacco Use: Never    Passive Exposure: Past  Financial Resource Strain: Not on file   Food Insecurity: No Food Insecurity (11/07/2023)   Epic    Worried About Programme Researcher, Broadcasting/film/video in the Last Year: Never true    Ran Out of Food in the Last Year: Never true  Transportation Needs: No Transportation Needs (11/07/2023)   Epic    Lack of Transportation (Medical): No    Lack of Transportation (Non-Medical): No  Physical Activity: Not on file  Stress: Not on file  Social Connections: Not on file  Depression (PHQ2-9): Low Risk (11/07/2023)   Depression (PHQ2-9)    PHQ-2 Score: 0  Alcohol  Screen: Not on file  Housing: Low Risk (11/07/2023)   Epic    Unable to Pay for Housing in the Last Year: No    Number of Times Moved in the Last Year: 0    Homeless in the Last Year: No  Utilities: Not At Risk (11/07/2023)   Epic    Threatened with loss of utilities: No  Health Literacy: Not on file   Family History  Problem Relation Age of Onset   Diabetes Mother    Diabetes Father    Lung cancer Father        smoker   Diabetes Sister    Diabetes Brother    Colon polyps Neg Hx    Colon cancer Neg Hx  Rectal cancer Neg Hx    Stomach cancer Neg Hx    Esophageal cancer Neg Hx    - negative except otherwise stated in the family history section Allergies[1] Prior to Admission medications  Medication Sig Start Date End Date Taking? Authorizing Provider  amLODipine  (NORVASC ) 5 MG tablet Take 5 mg by mouth daily. 06/22/22  Yes [provider]  atropine  1 % ophthalmic solution Place 1 drop into both eyes daily. 12/27/23  Yes   insulin  aspart protamine - aspart (NOVOLOG  MIX 70/30 FLEXPEN) (70-30) 100 UNIT/ML FlexPen Inject 20-25 Units into the skin See admin instructions. Inject 25 units into the skin with breakfast and 20 units with dinner.   Yes [provider]  prednisoLONE  acetate (PRED FORTE ) 1 % ophthalmic suspension Place 1 drop into both eyes 4 (four) times daily. 12/27/23  Yes   tadalafil  (CIALIS ) 5 MG tablet Take 1 tablet (5 mg total) by mouth daily as needed.  11/29/23  Yes   amoxicillin  (AMOXIL ) 500 MG capsule Take 1 capsule (500 mg total) by mouth 3 (three) times daily. Patient not taking: Reported on 04/03/2024 03/27/24   Nivia Colon, PA-C  benzonatate  (TESSALON ) 100 MG capsule Take 1 capsule (100 mg total) by mouth every 8 (eight) hours. Patient not taking: Reported on 04/03/2024 03/27/24   Nivia Colon, PA-C  doxycycline  (VIBRAMYCIN ) 100 MG capsule Take 1 capsule (100 mg total) by mouth 2 (two) times daily. Patient not taking: Reported on 04/03/2024 03/27/24   Nivia Colon, PA-C   VAS US  ABI WITH/WO TBI Result Date: 04/03/2024  LOWER EXTREMITY DOPPLER STUDY Patient Name:  Gene Fletcher  Date of Exam:   04/03/2024 Medical Rec #: [id number]            Accession #:    [id number] Date of Birth: May 10, 1950            Patient Gender: M Patient Age:   56 years Exam Location:  [facility] Procedure:      VAS US  ABI WITH/WO TBI Referring Phys: [name] JEFFERY --------------------------------------------------------------------------------  Indications: Gangrene. High Risk Factors: Hypertension, hyperlipidemia, Diabetes.  Limitations: Today's exam was limited due to an open wound, involuntary patient              movement and Left upper arm IV. Comparison Study: No prior studies. Performing Technologist: Gerome Ny RVT  Examination Guidelines: A complete evaluation includes at minimum, Doppler waveform signals and systolic blood pressure reading at the level of bilateral brachial, anterior tibial, and posterior tibial arteries, when vessel segments are accessible. Bilateral testing is considered an integral part of a complete examination. Photoelectric Plethysmograph (PPG) waveforms and toe systolic pressure readings are included as required and additional duplex testing as needed. Limited examinations for reoccurring indications may be performed as noted.  ABI Findings: +--------+------------------+-----+-----------+--------+ Right   Rt Pressure  (mmHg)IndexWaveform   Comment  +--------+------------------+-----+-----------+--------+ [id number]                    triphasic           +--------+------------------+-----+-----------+--------+ PTA     151               0.98 multiphasic         +--------+------------------+-----+-----------+--------+ DP      86                0.56 monophasic          +--------+------------------+-----+-----------+--------+ +--------+------------------+-----+----------+-------+ Left    Lt Pressure (mmHg)IndexWaveform  Comment +--------+------------------+-----+----------+-------+ Brachial                                 IV      +--------+------------------+-----+----------+-------+ PTA     83                0.54 monophasic        +--------+------------------+-----+----------+-------+ DP                             absent            +--------+------------------+-----+----------+-------+ +-------+-----------+-----------+------------+------------+ ABI/TBIToday's ABIToday's TBIPrevious ABIPrevious TBI +-------+-----------+-----------+------------+------------+ Right  0.98                                           +-------+-----------+-----------+------------+------------+ Left   0.54                                           +-------+-----------+-----------+------------+------------+  Spoke with Dr. Harden who states toe pressures are not necessary.  Summary: Right: Resting right ankle-brachial index is within normal range.  ABI is likely falsely elevated due to medial calcification. 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  *See table(s) above for measurements and observations.     Preliminary    DG Foot 2 Views Right Result Date: 04/03/2024 CLINICAL DATA:  Necrosis. EXAM: RIGHT FOOT - 2 VIEW COMPARISON:  None Available. FINDINGS: Mid and forefoot soft tissue gas. No definite underlying osseous erosion to suggest  osteomyelitis. IMPRESSION: Mid and forefoot necrotizing fasciitis. No definitive evidence of underlying osteomyelitis. Electronically Signed   By: Newell Eke M.D.   On: 04/03/2024 14:31   DG Chest 2 View Result Date: 04/03/2024 CLINICAL DATA:  Infection EXAM: CHEST - 2 VIEW COMPARISON:  October 16, 2023.  March 26, 2024. FINDINGS: The heart size and mediastinal contours are within normal limits. Right midlung nodular density is noted which corresponds to the nodule described on recent CT scan. Left lung is clear. The visualized skeletal structures are unremarkable. IMPRESSION: Right midlung nodular density is noted which corresponds to the nodule described on recent CT scan. Electronically Signed   By: Lynwood Landy Raddle M.D.   On: 04/03/2024 11:25   - pertinent xrays, CT, MRI studies were reviewed and independently interpreted  Positive ROS: All other systems have been reviewed and were otherwise negative with the exception of those mentioned in the HPI and as above.  Physical Exam: General: Alert, no acute distress Psychiatric: Patient is competent for consent with normal mood and affect Lymphatic: No axillary or cervical lymphadenopathy Cardiovascular: No pedal edema Respiratory: No cyanosis, no use of accessory musculature GI: No organomegaly, abdomen is soft and non-tender    Images:  @ENCIMAGES @  Labs:  Lab Results  Component Value Date   ESRSEDRATE 34 (H) 07/17/2023   CRP 0.6 07/17/2023    Lab Results  Component Value Date   ALBUMIN 3.2 (L) 04/03/2024   ALBUMIN 3.7 03/26/2024   ALBUMIN 3.6 07/17/2023        Latest Ref Rng & Units 04/03/2024   10:39 AM 03/26/2024    5:41 PM 03/26/2024   10:06 AM  CBC  EXTENDED  WBC 4.0 - 10.5 K/uL 18.3  15.3  12.3   RBC 4.22 - 5.81 MIL/uL 3.05  2.69  2.61   Hemoglobin 13.0 - 17.0 g/dL 9.8  8.9  8.4   HCT 60.9 - 52.0 % 30.4  27.6  24.5   Platelets 150 - 400 K/uL 391  213  259   NEUT# 1.7 - 7.7 K/uL 16.9  13.6  11.2   Lymph# 0.7 - 4.0  K/uL 0.3  0.4  0.2     Neurologic: Patient does not have protective sensation bilateral lower extremities.   MUSCULOSKELETAL:   Skin: Examination patient has dry gangrenous changes to the midfoot with ischemic changes of the midfoot to the hindfoot.  There is no ascending cellulitis.  Hemoglobin 9.8 with a white cell count of 18.3.  Glucose 497.  Creatinine 2.55.  Sodium 145  LRINEC score 6 without a C-reactive protein.  Ankle-brachial indices shows multiphasic flow at the ankle with falsely elevated pressures.  Assessment: Assessment: Gangrene right forefoot and midfoot with a LRINEC score of 6 without a C-reactive protein.  Plan: Plan: Patient does not have foot salvage intervention options.  Will plan for a below-knee amputation on Friday.  I will order a C-reactive protein.  Continue IV antibiotics.  Thank you for the consult and the opportunity to see Mr. Riyad Keena, MD [facility] [phone] 3:25 PM        [1]  Allergies Allergen Reactions   Flexeril  [Cyclobenzaprine ] Other (See Comments)    [place]. Hallucinations    "

## 2024-04-03 NOTE — Progress Notes (Signed)
 APS is involved in pt case. Please contact Arch Ada, APS Social Worker, at [phone] and notify her of pts dc plan.

## 2024-04-03 NOTE — ED Provider Notes (Signed)
 " [place] [facility] Provider Note   CSN: [id number] Arrival date & time: 04/03/24  9098     Patient presents with: Foot Pain   Gene Fletcher is a 74 y.o. male.   HPI Patient presents with his brother who assists with the history. He presents with malodorous necrotic right foot.  Unclear exact timing, but foot has become more malodorous over the past few days.  Patient denies systemic complaints, does note that he is being monitored by his oncologist for lung cancer, including evaluation yesterday.  Today family noticed where the malodorous smell is coming from, and with blackened skin along the dorsal distal right foot brought him in for evaluation.    Prior to Admission medications  Medication Sig Start Date End Date Taking? Authorizing Provider  amLODipine  (NORVASC ) 5 MG tablet Take 5 mg by mouth daily. 06/22/22  Yes [provider]  atropine  1 % ophthalmic solution Place 1 drop into both eyes daily. 12/27/23  Yes   insulin  aspart protamine - aspart (NOVOLOG  MIX 70/30 FLEXPEN) (70-30) 100 UNIT/ML FlexPen Inject 20-25 Units into the skin See admin instructions. Inject 25 units into the skin with breakfast and 20 units with dinner.   Yes [provider]  prednisoLONE  acetate (PRED FORTE ) 1 % ophthalmic suspension Place 1 drop into both eyes 4 (four) times daily. 12/27/23  Yes   tadalafil  (CIALIS ) 5 MG tablet Take 1 tablet (5 mg total) by mouth daily as needed. 11/29/23  Yes   amoxicillin  (AMOXIL ) 500 MG capsule Take 1 capsule (500 mg total) by mouth 3 (three) times daily. Patient not taking: Reported on 04/03/2024 03/27/24   Nivia Colon, PA-C  benzonatate  (TESSALON ) 100 MG capsule Take 1 capsule (100 mg total) by mouth every 8 (eight) hours. Patient not taking: Reported on 04/03/2024 03/27/24   Nivia Colon, PA-C  doxycycline  (VIBRAMYCIN ) 100 MG capsule Take 1 capsule (100 mg total) by mouth 2 (two) times daily. Patient not taking:  Reported on 04/03/2024 03/27/24   Nivia Colon, PA-C    Allergies: Flexeril  [cyclobenzaprine ]    Review of Systems  Updated Vital Signs BP (!) 163/76   Pulse 74   Temp 98.4 F (36.9 C)   Resp 18   SpO2 100%   Physical Exam Vitals and nursing note reviewed.  Constitutional:      General: He is not in acute distress.    Appearance: He is well-developed.  HENT:     Head: Normocephalic and atraumatic.  Eyes:     Conjunctiva/sclera: Conjunctivae normal.  Cardiovascular:     Rate and Rhythm: Normal rate and regular rhythm.  Pulmonary:     Effort: Pulmonary effort is normal. No respiratory distress.     Breath sounds: No stridor.  Abdominal:     General: There is no distension.  Musculoskeletal:       Legs:  Skin:    General: Skin is warm and dry.  Neurological:     Mental Status: He is alert and oriented to person, place, and time.     (all labs ordered are listed, but only abnormal results are displayed) Labs Reviewed  COMPREHENSIVE METABOLIC PANEL WITH GFR - Abnormal; Notable for the following components:      Result Value   CO2 20 (*)    Glucose, Bld 497 (*)    BUN 66 (*)    Creatinine, Ser 2.55 (*)    Albumin 3.2 (*)    Alkaline Phosphatase 149 (*)  GFR, Estimated 26 (*)    Anion gap 20 (*)    All other components within normal limits  CBC WITH DIFFERENTIAL/PLATELET - Abnormal; Notable for the following components:   WBC 18.3 (*)    RBC 3.05 (*)    Hemoglobin 9.8 (*)    HCT 30.4 (*)    Neutro Abs 16.9 (*)    Lymphs Abs 0.3 (*)    Abs Immature Granulocytes 0.13 (*)    All other components within normal limits  I-STAT CG4 LACTIC ACID, ED - Abnormal; Notable for the following components:   Lactic Acid, Venous 2.3 (*)    All other components within normal limits  CBG MONITORING, ED - Abnormal; Notable for the following components:   Glucose-Capillary 441 (*)    All other components within normal limits  URINALYSIS, W/ REFLEX TO CULTURE (INFECTION  SUSPECTED)  BASIC METABOLIC PANEL WITH GFR  BASIC METABOLIC PANEL WITH GFR  BASIC METABOLIC PANEL WITH GFR  BASIC METABOLIC PANEL WITH GFR  BETA-HYDROXYBUTYRIC ACID  BETA-HYDROXYBUTYRIC ACID  BETA-HYDROXYBUTYRIC ACID  BETA-HYDROXYBUTYRIC ACID  HEMOGLOBIN A1C  C-REACTIVE PROTEIN  I-STAT CG4 LACTIC ACID, ED    EKG: None  Radiology: VAS US  ABI WITH/WO TBI Result Date: 04/03/2024  LOWER EXTREMITY DOPPLER STUDY Patient Name:  Gene Fletcher  Date of Exam:   04/03/2024 Medical Rec #: [id number]            Accession #:    [id number] Date of Birth: Sep 13, 1950            Patient Gender: M Patient Age:   83 years Exam Location:  [facility] Procedure:      VAS US  ABI WITH/WO TBI Referring Phys: [name] JEFFERY --------------------------------------------------------------------------------  Indications: Gangrene. High Risk Factors: Hypertension, hyperlipidemia, Diabetes.  Limitations: Today's exam was limited due to an open wound, involuntary patient              movement and Left upper arm IV. Comparison Study: No prior studies. Performing Technologist: Gerome Ny RVT  Examination Guidelines: A complete evaluation includes at minimum, Doppler waveform signals and systolic blood pressure reading at the level of bilateral brachial, anterior tibial, and posterior tibial arteries, when vessel segments are accessible. Bilateral testing is considered an integral part of a complete examination. Photoelectric Plethysmograph (PPG) waveforms and toe systolic pressure readings are included as required and additional duplex testing as needed. Limited examinations for reoccurring indications may be performed as noted.  ABI Findings: +--------+------------------+-----+-----------+--------+ Right   Rt Pressure (mmHg)IndexWaveform   Comment  +--------+------------------+-----+-----------+--------+ [id number]                    triphasic            +--------+------------------+-----+-----------+--------+ PTA     151               0.98 multiphasic         +--------+------------------+-----+-----------+--------+ DP      86                0.56 monophasic          +--------+------------------+-----+-----------+--------+ +--------+------------------+-----+----------+-------+ Left    Lt Pressure (mmHg)IndexWaveform  Comment +--------+------------------+-----+----------+-------+ Brachial                                 IV      +--------+------------------+-----+----------+-------+ PTA     83  0.54 monophasic        +--------+------------------+-----+----------+-------+ DP                             absent            +--------+------------------+-----+----------+-------+ +-------+-----------+-----------+------------+------------+ ABI/TBIToday's ABIToday's TBIPrevious ABIPrevious TBI +-------+-----------+-----------+------------+------------+ Right  0.98                                           +-------+-----------+-----------+------------+------------+ Left   0.54                                           +-------+-----------+-----------+------------+------------+  Spoke with Dr. Harden who states toe pressures are not necessary.  Summary: Right: Resting right ankle-brachial index is within normal range.  ABI is likely falsely elevated due to medial calcification. 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  *See table(s) above for measurements and observations.     Preliminary    DG Foot 2 Views Right Result Date: 04/03/2024 CLINICAL DATA:  Necrosis. EXAM: RIGHT FOOT - 2 VIEW COMPARISON:  None Available. FINDINGS: Mid and forefoot soft tissue gas. No definite underlying osseous erosion to suggest osteomyelitis. IMPRESSION: Mid and forefoot necrotizing fasciitis. No definitive evidence of underlying osteomyelitis. Electronically Signed   By:  Newell Eke M.D.   On: 04/03/2024 14:31   DG Chest 2 View Result Date: 04/03/2024 CLINICAL DATA:  Infection EXAM: CHEST - 2 VIEW COMPARISON:  October 16, 2023.  March 26, 2024. FINDINGS: The heart size and mediastinal contours are within normal limits. Right midlung nodular density is noted which corresponds to the nodule described on recent CT scan. Left lung is clear. The visualized skeletal structures are unremarkable. IMPRESSION: Right midlung nodular density is noted which corresponds to the nodule described on recent CT scan. Electronically Signed   By: Lynwood Landy Raddle M.D.   On: 04/03/2024 11:25     Procedures   Medications Ordered in the ED  amLODipine  (NORVASC ) tablet 5 mg (has no administration in time range)  atropine  1 % ophthalmic solution 1 drop (has no administration in time range)  prednisoLONE  acetate (PRED FORTE ) 1 % ophthalmic suspension 1 drop (has no administration in time range)  lactated ringers  bolus 1,316 mL (has no administration in time range)  insulin  regular, human (MYXREDLIN ) 100 units/ 100 mL infusion (has no administration in time range)  lactated ringers  infusion (has no administration in time range)  dextrose  5 % in lactated ringers  infusion (has no administration in time range)  dextrose  50 % solution 0-50 mL (has no administration in time range)  potassium chloride  10 mEq in 100 mL IVPB (has no administration in time range)  metroNIDAZOLE  (FLAGYL ) IVPB 500 mg (has no administration in time range)  ceFEPIme  (MAXIPIME ) 2 g in sodium chloride  0.9 % 100 mL IVPB (has no administration in time range)  vancomycin  (VANCOCIN ) IVPB 1000 mg/200 mL premix (has no administration in time range)  vancomycin  (VANCOREADY) IVPB 1500 mg/300 mL (1,500 mg Intravenous New Bag/Given 04/03/24 1256)  piperacillin -tazobactam (ZOSYN ) IVPB 3.375 g (0 g Intravenous Stopped 04/03/24 1226)  Medical Decision Making Adult male with diabetes, renal  disease presents with signs and symptoms of infection versus necrosis of distal right foot.  Patient's vital signs are initially unremarkable which is somewhat reassuring for low suspicion of bacteremia, sepsis.  With concern for osteo versus infection versus necrosis x-rays, labs ordered.  Amount and/or Complexity of Data Reviewed Independent Historian:     Details: Brother at bedside External Data Reviewed: notes. Labs: ordered. Decision-making details documented in ED Course. Radiology: ordered and independent interpretation performed. Decision-making details documented in ED Course.  Risk Prescription drug management. Decision regarding hospitalization. Diagnosis or treatment significantly limited by social determinants of health.   On repeat exam patient still accompanied by his brother, now amenable to evaluation and admission.  Update:, Initial labs notable for lactic acidosis, patient nearly completed empiric antibiotics provided after initial concern for cellulitis with necrotic features. Chest x-ray unremarkable, foot x-ray with evidence for possible necrotizing fasciitis, concern for physical exam.  Patient's lactic acidosis has resolved, leukocytosis, findings case has been discussed according with her orthopedic team.  Patient has had vascular ultrasound, without substantial asymmetry.    Beyond the patient's findings consistent with infection, patient is hyperglycemic this will require additional management as an inpatient as well.  I have discussed patient's case again with our internal medicine colleagues, who will admit the patient with anticipated amputation of some portion of the patient's foot, ongoing antibiotics, monitoring.  CRITICAL CARE Performed by: Lamar Salen Total critical care time: 40 minutes Critical care time was exclusive of separately billable procedures and treating other patients. Critical care was necessary to treat or prevent imminent or  life-threatening deterioration. Critical care was time spent personally by me on the following activities: development of treatment plan with patient and/or surrogate as well as nursing, discussions with consultants, evaluation of patient's response to treatment, examination of patient, obtaining history from patient or surrogate, ordering and performing treatments and interventions, ordering and review of laboratory studies, ordering and review of radiographic studies, pulse oximetry and re-evaluation of patient's condition.   Final diagnoses:  Gangrene of right foot [facility])    ED Discharge Orders     None          Salen Lamar, MD 04/03/24 1533  "

## 2024-04-03 NOTE — ED Triage Notes (Addendum)
 Pt reports hc DM; reports ulcer to foot x 6 months; malodorous, draining; denies pain; denies fevers; reports he is a Jehovah's witness, declined blood products; pt gives verbal consent for mse  Pt's brother lives in [place] reports concern for pt possibly having dementia and possibly recent stroke; reports R arm contracted, not holding as he normally does; also reports pt having multiple falls recently ; reports pt lives alone, concern that pt cannot care for self; requesting CSW consult

## 2024-04-03 NOTE — H&P (View-Only) (Signed)
 "   ORTHOPAEDIC CONSULTATION  REQUESTING PHYSICIAN: Seena Marsa NOVAK, MD  Chief Complaint: Gangrene right midfoot and forefoot  HPI: Gene Fletcher is a 74 y.o. 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  Patient states that it has been present for months.  Patient states he does not smoke , has diabetes   Past Medical History:  Diagnosis Date   Arthritis    Diabetes mellitus without complication (HCC)    Hyperlipidemia    Hypertension    Past Surgical History:  Procedure Laterality Date   COLONOSCOPY     VIDEO BRONCHOSCOPY WITH ENDOBRONCHIAL NAVIGATION Right 10/16/2023   Procedure: VIDEO BRONCHOSCOPY WITH ENDOBRONCHIAL NAVIGATION;  Surgeon: Shelah Lamar RAMAN, MD;  Location: [facility] ENDOSCOPY;  Service: Pulmonary;  Laterality: Right;   VIDEO BRONCHOSCOPY WITH ENDOBRONCHIAL ULTRASOUND Right 10/16/2023   Procedure: BRONCHOSCOPY, WITH EBUS;  Surgeon: Shelah Lamar RAMAN, MD;  Location: [facility] ENDOSCOPY;  Service: Pulmonary;  Laterality: Right;   Social History   Socioeconomic History   Marital status: Married    Spouse name: Not on file   Number of children: 1   Years of education: Not on file   Highest education level: Not on file  Occupational History   Not on file  Tobacco Use   Smoking status: Never    Passive exposure: Past   Smokeless tobacco: Never  Vaping Use   Vaping status: Never Used  Substance and Sexual Activity   Alcohol  use: Not Currently    Alcohol /week: 2.0 standard drinks of alcohol     Types: 2 Standard drinks or equivalent per week   Drug use: No   Sexual activity: Not on file  Other Topics Concern   Not on file  Social History Narrative   Retired- [employer]      Right handed    Wear glasses    Drinks green tea daily    Social Drivers of Health   Tobacco Use: Low Risk (04/03/2024)   Patient History    Smoking Tobacco Use: Never    Smokeless Tobacco Use: Never    Passive Exposure: Past  Financial Resource Strain: Not on file   Food Insecurity: No Food Insecurity (11/07/2023)   Epic    Worried About Programme Researcher, Broadcasting/film/video in the Last Year: Never true    Ran Out of Food in the Last Year: Never true  Transportation Needs: No Transportation Needs (11/07/2023)   Epic    Lack of Transportation (Medical): No    Lack of Transportation (Non-Medical): No  Physical Activity: Not on file  Stress: Not on file  Social Connections: Not on file  Depression (PHQ2-9): Low Risk (11/07/2023)   Depression (PHQ2-9)    PHQ-2 Score: 0  Alcohol  Screen: Not on file  Housing: Low Risk (11/07/2023)   Epic    Unable to Pay for Housing in the Last Year: No    Number of Times Moved in the Last Year: 0    Homeless in the Last Year: No  Utilities: Not At Risk (11/07/2023)   Epic    Threatened with loss of utilities: No  Health Literacy: Not on file   Family History  Problem Relation Age of Onset   Diabetes Mother    Diabetes Father    Lung cancer Father        smoker   Diabetes Sister    Diabetes Brother    Colon polyps Neg Hx    Colon cancer Neg Hx  Rectal cancer Neg Hx    Stomach cancer Neg Hx    Esophageal cancer Neg Hx    - negative except otherwise stated in the family history section Allergies[1] Prior to Admission medications  Medication Sig Start Date End Date Taking? Authorizing Provider  amLODipine  (NORVASC ) 5 MG tablet Take 5 mg by mouth daily. 06/22/22  Yes [provider]  atropine  1 % ophthalmic solution Place 1 drop into both eyes daily. 12/27/23  Yes   insulin  aspart protamine - aspart (NOVOLOG  MIX 70/30 FLEXPEN) (70-30) 100 UNIT/ML FlexPen Inject 20-25 Units into the skin See admin instructions. Inject 25 units into the skin with breakfast and 20 units with dinner.   Yes [provider]  prednisoLONE  acetate (PRED FORTE ) 1 % ophthalmic suspension Place 1 drop into both eyes 4 (four) times daily. 12/27/23  Yes   tadalafil  (CIALIS ) 5 MG tablet Take 1 tablet (5 mg total) by mouth daily as needed.  11/29/23  Yes   amoxicillin  (AMOXIL ) 500 MG capsule Take 1 capsule (500 mg total) by mouth 3 (three) times daily. Patient not taking: Reported on 04/03/2024 03/27/24   Nivia Colon, PA-C  benzonatate  (TESSALON ) 100 MG capsule Take 1 capsule (100 mg total) by mouth every 8 (eight) hours. Patient not taking: Reported on 04/03/2024 03/27/24   Nivia Colon, PA-C  doxycycline  (VIBRAMYCIN ) 100 MG capsule Take 1 capsule (100 mg total) by mouth 2 (two) times daily. Patient not taking: Reported on 04/03/2024 03/27/24   Nivia Colon, PA-C   VAS US  ABI WITH/WO TBI Result Date: 04/03/2024  LOWER EXTREMITY DOPPLER STUDY Patient Name:  Gene Fletcher  Date of Exam:   04/03/2024 Medical Rec #: [id number]            Accession #:    [id number] Date of Birth: May 10, 1950            Patient Gender: M Patient Age:   56 years Exam Location:  [facility] Procedure:      VAS US  ABI WITH/WO TBI Referring Phys: [name] JEFFERY --------------------------------------------------------------------------------  Indications: Gangrene. High Risk Factors: Hypertension, hyperlipidemia, Diabetes.  Limitations: Today's exam was limited due to an open wound, involuntary patient              movement and Left upper arm IV. Comparison Study: No prior studies. Performing Technologist: Gerome Ny RVT  Examination Guidelines: A complete evaluation includes at minimum, Doppler waveform signals and systolic blood pressure reading at the level of bilateral brachial, anterior tibial, and posterior tibial arteries, when vessel segments are accessible. Bilateral testing is considered an integral part of a complete examination. Photoelectric Plethysmograph (PPG) waveforms and toe systolic pressure readings are included as required and additional duplex testing as needed. Limited examinations for reoccurring indications may be performed as noted.  ABI Findings: +--------+------------------+-----+-----------+--------+ Right   Rt Pressure  (mmHg)IndexWaveform   Comment  +--------+------------------+-----+-----------+--------+ [id number]                    triphasic           +--------+------------------+-----+-----------+--------+ PTA     151               0.98 multiphasic         +--------+------------------+-----+-----------+--------+ DP      86                0.56 monophasic          +--------+------------------+-----+-----------+--------+ +--------+------------------+-----+----------+-------+ Left    Lt Pressure (mmHg)IndexWaveform  Comment +--------+------------------+-----+----------+-------+ Brachial                                 IV      +--------+------------------+-----+----------+-------+ PTA     83                0.54 monophasic        +--------+------------------+-----+----------+-------+ DP                             absent            +--------+------------------+-----+----------+-------+ +-------+-----------+-----------+------------+------------+ ABI/TBIToday's ABIToday's TBIPrevious ABIPrevious TBI +-------+-----------+-----------+------------+------------+ Right  0.98                                           +-------+-----------+-----------+------------+------------+ Left   0.54                                           +-------+-----------+-----------+------------+------------+  Spoke with Dr. Harden who states toe pressures are not necessary.  Summary: Right: Resting right ankle-brachial index is within normal range.  ABI is likely falsely elevated due to medial calcification. 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  *See table(s) above for measurements and observations.     Preliminary    DG Foot 2 Views Right Result Date: 04/03/2024 CLINICAL DATA:  Necrosis. EXAM: RIGHT FOOT - 2 VIEW COMPARISON:  None Available. FINDINGS: Mid and forefoot soft tissue gas. No definite underlying osseous erosion to suggest  osteomyelitis. IMPRESSION: Mid and forefoot necrotizing fasciitis. No definitive evidence of underlying osteomyelitis. Electronically Signed   By: Newell Eke M.D.   On: 04/03/2024 14:31   DG Chest 2 View Result Date: 04/03/2024 CLINICAL DATA:  Infection EXAM: CHEST - 2 VIEW COMPARISON:  October 16, 2023.  March 26, 2024. FINDINGS: The heart size and mediastinal contours are within normal limits. Right midlung nodular density is noted which corresponds to the nodule described on recent CT scan. Left lung is clear. The visualized skeletal structures are unremarkable. IMPRESSION: Right midlung nodular density is noted which corresponds to the nodule described on recent CT scan. Electronically Signed   By: Lynwood Landy Raddle M.D.   On: 04/03/2024 11:25   - pertinent xrays, CT, MRI studies were reviewed and independently interpreted  Positive ROS: All other systems have been reviewed and were otherwise negative with the exception of those mentioned in the HPI and as above.  Physical Exam: General: Alert, no acute distress Psychiatric: Patient is competent for consent with normal mood and affect Lymphatic: No axillary or cervical lymphadenopathy Cardiovascular: No pedal edema Respiratory: No cyanosis, no use of accessory musculature GI: No organomegaly, abdomen is soft and non-tender    Images:  @ENCIMAGES @  Labs:  Lab Results  Component Value Date   ESRSEDRATE 34 (H) 07/17/2023   CRP 0.6 07/17/2023    Lab Results  Component Value Date   ALBUMIN 3.2 (L) 04/03/2024   ALBUMIN 3.7 03/26/2024   ALBUMIN 3.6 07/17/2023        Latest Ref Rng & Units 04/03/2024   10:39 AM 03/26/2024    5:41 PM 03/26/2024   10:06 AM  CBC  EXTENDED  WBC 4.0 - 10.5 K/uL 18.3  15.3  12.3   RBC 4.22 - 5.81 MIL/uL 3.05  2.69  2.61   Hemoglobin 13.0 - 17.0 g/dL 9.8  8.9  8.4   HCT 60.9 - 52.0 % 30.4  27.6  24.5   Platelets 150 - 400 K/uL 391  213  259   NEUT# 1.7 - 7.7 K/uL 16.9  13.6  11.2   Lymph# 0.7 - 4.0  K/uL 0.3  0.4  0.2     Neurologic: Patient does not have protective sensation bilateral lower extremities.   MUSCULOSKELETAL:   Skin: Examination patient has dry gangrenous changes to the midfoot with ischemic changes of the midfoot to the hindfoot.  There is no ascending cellulitis.  Hemoglobin 9.8 with a white cell count of 18.3.  Glucose 497.  Creatinine 2.55.  Sodium 145  LRINEC score 6 without a C-reactive protein.  Ankle-brachial indices shows multiphasic flow at the ankle with falsely elevated pressures.  Assessment: Assessment: Gangrene right forefoot and midfoot with a LRINEC score of 6 without a C-reactive protein.  Plan: Plan: Patient does not have foot salvage intervention options.  Will plan for a below-knee amputation on Friday.  I will order a C-reactive protein.  Continue IV antibiotics.  Thank you for the consult and the opportunity to see Mr. Riyad Keena, MD [facility] [phone] 3:25 PM        [1]  Allergies Allergen Reactions   Flexeril  [Cyclobenzaprine ] Other (See Comments)    [place]. Hallucinations    "

## 2024-04-04 LAB — CBC
HCT: 26.1 % — ABNORMAL LOW (ref 39.0–52.0)
Hemoglobin: 8.5 g/dL — ABNORMAL LOW (ref 13.0–17.0)
MCH: 32.1 pg (ref 26.0–34.0)
MCHC: 32.6 g/dL (ref 30.0–36.0)
MCV: 98.5 fL (ref 80.0–100.0)
Platelets: 296 K/uL (ref 150–400)
RBC: 2.65 MIL/uL — ABNORMAL LOW (ref 4.22–5.81)
RDW: 12.6 % (ref 11.5–15.5)
WBC: 21.7 K/uL — ABNORMAL HIGH (ref 4.0–10.5)
nRBC: 0 % (ref 0.0–0.2)

## 2024-04-04 LAB — BASIC METABOLIC PANEL WITH GFR
Anion gap: 12 (ref 5–15)
BUN: 59 mg/dL — ABNORMAL HIGH (ref 8–23)
CO2: 24 mmol/L (ref 22–32)
Calcium: 9.4 mg/dL (ref 8.9–10.3)
Chloride: 108 mmol/L (ref 98–111)
Creatinine, Ser: 2.36 mg/dL — ABNORMAL HIGH (ref 0.61–1.24)
GFR, Estimated: 28 mL/min — ABNORMAL LOW
Glucose, Bld: 204 mg/dL — ABNORMAL HIGH (ref 70–99)
Potassium: 3.8 mmol/L (ref 3.5–5.1)
Sodium: 144 mmol/L (ref 135–145)

## 2024-04-04 LAB — GLUCOSE, CAPILLARY
Glucose-Capillary: 177 mg/dL — ABNORMAL HIGH (ref 70–99)
Glucose-Capillary: 173 mg/dL — ABNORMAL HIGH (ref 70–99)

## 2024-04-04 LAB — CBG MONITORING, ED
Glucose-Capillary: 164 mg/dL — ABNORMAL HIGH (ref 70–99)
Glucose-Capillary: 183 mg/dL — ABNORMAL HIGH (ref 70–99)
Glucose-Capillary: 193 mg/dL — ABNORMAL HIGH (ref 70–99)
Glucose-Capillary: 196 mg/dL — ABNORMAL HIGH (ref 70–99)
Glucose-Capillary: 211 mg/dL — ABNORMAL HIGH (ref 70–99)

## 2024-04-04 MED ORDER — ONDANSETRON HCL 4 MG/2ML IJ SOLN
4.0000 mg | Freq: Four times a day (QID) | INTRAMUSCULAR | Status: DC | PRN
  Administered 2024-04-04 – 2024-04-15 (×13): 4 mg via INTRAVENOUS
  Filled 2024-04-04 (×12): qty 2

## 2024-04-04 MED ORDER — INSULIN GLARGINE 100 UNIT/ML ~~LOC~~ SOLN
15.0000 [IU] | Freq: Every day | SUBCUTANEOUS | Status: DC
  Administered 2024-04-04: 15 [IU] via SUBCUTANEOUS
  Filled 2024-04-04 (×2): qty 0.15

## 2024-04-04 MED ORDER — METOCLOPRAMIDE HCL 5 MG/ML IJ SOLN
5.0000 mg | Freq: Once | INTRAMUSCULAR | Status: AC
  Administered 2024-04-05: 5 mg via INTRAVENOUS
  Filled 2024-04-04: qty 2

## 2024-04-04 MED ORDER — INSULIN ASPART 100 UNIT/ML IJ SOLN
0.0000 [IU] | Freq: Three times a day (TID) | INTRAMUSCULAR | Status: DC
  Administered 2024-04-04 – 2024-04-06 (×5): 2 [IU] via SUBCUTANEOUS
  Administered 2024-04-06 (×2): 5 [IU] via SUBCUTANEOUS
  Administered 2024-04-08 (×3): 2 [IU] via SUBCUTANEOUS
  Administered 2024-04-09: 3 [IU] via SUBCUTANEOUS
  Administered 2024-04-09: 2 [IU] via SUBCUTANEOUS
  Administered 2024-04-09: 3 [IU] via SUBCUTANEOUS
  Administered 2024-04-10: 1 [IU] via SUBCUTANEOUS
  Administered 2024-04-10 (×2): 2 [IU] via SUBCUTANEOUS
  Administered 2024-04-11: 1 [IU] via SUBCUTANEOUS
  Administered 2024-04-11 (×2): 3 [IU] via SUBCUTANEOUS
  Administered 2024-04-12: 5 [IU] via SUBCUTANEOUS
  Administered 2024-04-12: 2 [IU] via SUBCUTANEOUS
  Administered 2024-04-12 – 2024-04-13 (×2): 3 [IU] via SUBCUTANEOUS
  Administered 2024-04-13: 2 [IU] via SUBCUTANEOUS
  Administered 2024-04-13: 5 [IU] via SUBCUTANEOUS
  Administered 2024-04-14 (×2): 2 [IU] via SUBCUTANEOUS
  Administered 2024-04-14: 3 [IU] via SUBCUTANEOUS
  Administered 2024-04-15: 1 [IU] via SUBCUTANEOUS
  Administered 2024-04-15 (×2): 3 [IU] via SUBCUTANEOUS
  Filled 2024-04-04: qty 1
  Filled 2024-04-04: qty 2
  Filled 2024-04-04: qty 3
  Filled 2024-04-04: qty 2
  Filled 2024-04-04 (×2): qty 3
  Filled 2024-04-04: qty 5
  Filled 2024-04-04: qty 1
  Filled 2024-04-04: qty 2
  Filled 2024-04-04: qty 3
  Filled 2024-04-04 (×5): qty 2
  Filled 2024-04-04: qty 1
  Filled 2024-04-04: qty 3
  Filled 2024-04-04: qty 2
  Filled 2024-04-04: qty 5
  Filled 2024-04-04 (×3): qty 2
  Filled 2024-04-04: qty 3
  Filled 2024-04-04: qty 1
  Filled 2024-04-04: qty 3
  Filled 2024-04-04: qty 5
  Filled 2024-04-04: qty 2
  Filled 2024-04-04: qty 1
  Filled 2024-04-04: qty 2
  Filled 2024-04-04: qty 1
  Filled 2024-04-04: qty 2

## 2024-04-04 MED ORDER — VANCOMYCIN HCL IN DEXTROSE 1-5 GM/200ML-% IV SOLN: 1000.0000 mg | INTRAVENOUS | Status: DC

## 2024-04-04 MED ORDER — DIPHENHYDRAMINE HCL 50 MG/ML IJ SOLN
25.0000 mg | Freq: Once | INTRAMUSCULAR | Status: AC
  Administered 2024-04-05: 25 mg via INTRAVENOUS
  Filled 2024-04-04: qty 1

## 2024-04-04 MED ORDER — INSULIN ASPART PROT & ASPART (70-30 MIX) 100 UNIT/ML ~~LOC~~ SUSP
20.0000 [IU] | Freq: Two times a day (BID) | SUBCUTANEOUS | Status: DC
  Administered 2024-04-04 (×2): 20 [IU] via SUBCUTANEOUS
  Filled 2024-04-04 (×2): qty 10

## 2024-04-04 MED ORDER — LACTATED RINGERS IV SOLN: INTRAVENOUS | Status: DC

## 2024-04-04 MED ORDER — LINEZOLID 600 MG PO TABS
600.0000 mg | ORAL_TABLET | Freq: Two times a day (BID) | ORAL | Status: AC
  Administered 2024-04-04 – 2024-04-08 (×9): 600 mg via ORAL
  Filled 2024-04-04 (×9): qty 1

## 2024-04-04 MED ORDER — CHLORHEXIDINE GLUCONATE 4 % EX SOLN
60.0000 mL | Freq: Once | CUTANEOUS | Status: AC
  Administered 2024-04-05: 4 via TOPICAL
  Filled 2024-04-04: qty 60

## 2024-04-04 NOTE — Plan of Care (Signed)
 Patient is progressing towards goals of care.     Problem: Education: Goal: Ability to describe self-care measures that may prevent or decrease complications (Diabetes Survival Skills Education) will improve Outcome: Progressing Goal: Individualized Educational Video(s) Outcome: Progressing   Problem: Coping: Goal: Ability to adjust to condition or change in health will improve Outcome: Progressing   Problem: Fluid Volume: Goal: Ability to maintain a balanced intake and output will improve Outcome: Progressing   Problem: Health Behavior/Discharge Planning: Goal: Ability to identify and utilize available resources and services will improve Outcome: Progressing Goal: Ability to manage health-related needs will improve Outcome: Progressing   Problem: Metabolic: Goal: Ability to maintain appropriate glucose levels will improve Outcome: Progressing   Problem: Nutritional: Goal: Maintenance of adequate nutrition will improve Outcome: Progressing Goal: Progress toward achieving an optimal weight will improve Outcome: Progressing   Problem: Skin Integrity: Goal: Risk for impaired skin integrity will decrease Outcome: Progressing   Problem: Tissue Perfusion: Goal: Adequacy of tissue perfusion will improve Outcome: Progressing   Problem: Education: Goal: Ability to describe self-care measures that may prevent or decrease complications (Diabetes Survival Skills Education) will improve Outcome: Progressing Goal: Individualized Educational Video(s) Outcome: Progressing   Problem: Cardiac: Goal: Ability to maintain an adequate cardiac output will improve Outcome: Progressing   Problem: Health Behavior/Discharge Planning: Goal: Ability to identify and utilize available resources and services will improve Outcome: Progressing Goal: Ability to manage health-related needs will improve Outcome: Progressing   Problem: Fluid Volume: Goal: Ability to achieve a balanced intake and  output will improve Outcome: Progressing   Problem: Metabolic: Goal: Ability to maintain appropriate glucose levels will improve Outcome: Progressing   Problem: Nutritional: Goal: Maintenance of adequate nutrition will improve Outcome: Progressing Goal: Maintenance of adequate weight for body size and type will improve Outcome: Progressing   Problem: Respiratory: Goal: Will regain and/or maintain adequate ventilation Outcome: Progressing   Problem: Urinary Elimination: Goal: Ability to achieve and maintain adequate renal perfusion and functioning will improve Outcome: Progressing   Problem: Education: Goal: Knowledge of General Education information will improve Description: Including pain rating scale, medication(s)/side effects and non-pharmacologic comfort measures Outcome: Progressing   Problem: Health Behavior/Discharge Planning: Goal: Ability to manage health-related needs will improve Outcome: Progressing   Problem: Clinical Measurements: Goal: Ability to maintain clinical measurements within normal limits will improve Outcome: Progressing Goal: Will remain free from infection Outcome: Progressing Goal: Diagnostic test results will improve Outcome: Progressing Goal: Respiratory complications will improve Outcome: Progressing Goal: Cardiovascular complication will be avoided Outcome: Progressing   Problem: Activity: Goal: Risk for activity intolerance will decrease Outcome: Progressing   Problem: Nutrition: Goal: Adequate nutrition will be maintained Outcome: Progressing   Problem: Coping: Goal: Level of anxiety will decrease Outcome: Progressing   Problem: Elimination: Goal: Will not experience complications related to bowel motility Outcome: Progressing Goal: Will not experience complications related to urinary retention Outcome: Progressing   Problem: Pain Managment: Goal: General experience of comfort will improve and/or be controlled Outcome:  Progressing   Problem: Safety: Goal: Ability to remain free from injury will improve Outcome: Progressing   Problem: Skin Integrity: Goal: Risk for impaired skin integrity will decrease Outcome: Progressing

## 2024-04-04 NOTE — ED Notes (Signed)
 Patient refusing CBG and daily + sliding scale insulin . Patient states he is not getting pricked or stuck anymore. Patient educated on importance of maintaining control of sugars for wound healing. Patient continues to refuse, family member at bedside states he will attempt to encourage him.   Patient's brother currently attempting to encourage patient to get into gown/get cleaned up- patient reports he's not doing that now but maybe when he goes upstairs.   Patient eating breakfast, drinking water and urinal emptied. No further needs expressed.

## 2024-04-04 NOTE — Progress Notes (Signed)
 PHARMACY NOTE:  ANTIMICROBIAL RENAL DOSAGE ADJUSTMENT  Current antimicrobial regimen includes a mismatch between antimicrobial dosage and estimated renal function.  As per policy approved by the Pharmacy & Therapeutics and Medical Executive Committees, the antimicrobial dosage will be adjusted accordingly.  Current antimicrobial dosage:  Vancomycin  1g IV q 48h (eAUC 377, Scr 2.36)  Indication: DFI  Renal Function:  Estimated Creatinine Clearance: 25.6 mL/min (A) (by C-G formula based on SCr of 2.36 mg/dL (H)). []      On intermittent HD, scheduled: []      On CRRT    Antimicrobial dosage has been changed to:  Vancomycin  1g IV q36h (eAUC 503, Scr 2.36)  Additional comments:   Thank you for allowing pharmacy to be a part of this patient's care.  Leonor GORMAN Bash, [facility] 04/04/2024 10:56 AM

## 2024-04-04 NOTE — Progress Notes (Signed)
. ° ° °  PROCEDURAL EXPEDITER PROGRESS NOTE  Patient Name: Gene Fletcher  DOB:[birth year] Date of Admission: 04/03/2024  Date of Assessment:04/04/24   -------------------------------------------------------------------------------------------------------------------   Brief clinical summary: Pt to OR tomorrow for Right below knee amputation   Orders in place:  Yes   Labs, test, and orders reviewed: Y  Requires surgical clearance:  No  Barriers noted: Needs surgical PCR   Intervention provided by [facility] team: Order for surgical pcr placed  Barrier resolved:  yes   -------------------------------------------------------------------------------------------------------------------  [employer], [place], [place] Please contact us  directly via secure chat (search for [facility]) or by calling us  at [phone] [facility]).

## 2024-04-04 NOTE — ED Notes (Signed)
 Patient denies pain and is resting comfortably.

## 2024-04-04 NOTE — ED Notes (Signed)
 Phleb unable to get labs, pt refused 2nd stick

## 2024-04-04 NOTE — ED Notes (Signed)
 Patient agreed to [facility] and insulin  administration. Agrees to have eyes cleaned but does not want to change into gown at this time. Brother at bedside.

## 2024-04-04 NOTE — Inpatient Diabetes Management (Signed)
 Inpatient Diabetes Program Recommendations  AACE/ADA: New Consensus Statement on Inpatient Glycemic Control (2015)  Target Ranges:  Prepandial:   less than 140 mg/dL      Peak postprandial:   less than 180 mg/dL (1-2 hours)      Critically ill patients:  140 - 180 mg/dL   Lab Results  Component Value Date   GLUCAP 211 (H) 04/04/2024   HGBA1C 9.2 (H) 04/03/2024    Review of Glycemic Control  Latest Reference Range & Units 04/03/24 23:22 04/04/24 00:30 04/04/24 01:38 04/04/24 02:32 04/04/24 04:51  Glucose-Capillary 70 - 99 mg/dL 818 (H) 816 (H) 806 (H) 164 (H) 211 (H)  (H): Data is abnormally high Diabetes history: DM2 Outpatient Diabetes medications: 70/30 25 units QAM and 20 units QPM Current orders for Inpatient glycemic control: Novolog  0-9 units TID, Novolog  70/30 20 units BID   Inpatient Diabetes Program Recommendations:     Semglee  15 units QHS  With NPO status in AM may want to consider changing to basal and discontinuing 70/30. Secure chat sent to MD.   Thanks, Tinnie Minus, MSN, RNC-OB Diabetes Coordinator [phone] (8a-5p)

## 2024-04-04 NOTE — ED Notes (Signed)
 Patient refusing CBG check prior to subcutaneous insulin  administration. Agrees to have CBG checked post administration.

## 2024-04-04 NOTE — Progress Notes (Signed)
 CSW spoke w/ pt brother, Eyob Godlewski ([phone]), at bedside. Brother reports family preference for pt to transition to LTC following completion of STR. SNF placement in or near [place], [place] is preferred, as no family members are local. Brother also expressed concerns regarding possible unwitnessed stroke and increased vision loss. Brother requested that pt receive a thorough medical evaluation to address additional health concerns prior to dc from the hospital.

## 2024-04-04 NOTE — Progress Notes (Addendum)
 "  PROGRESS NOTE    Gene Fletcher  FMW:[id number] DOB: 20-Jan-1951 DOA: 04/03/2024 PCP: [name], MD   Brief Narrative: Gene Fletcher is a 74 y.o. male with a history of hypertension, hyperlipidemia, diabetes mellitus type 2, CKD stage IV, lung cancer s/p radiation, chronic anemia.  Patient presented secondary to a left foot wound with evidence of infection and dry gangrene. Empiric antibiotics started. Orthopedic surgery consulted and plan a left below-knee amputation.   Assessment/Plan:  Diabetic foot infection Dry gangrene of the right foot Patient with dry gangrene of the second, third, fourth and fifth digits of right foot. Empiric Vancomycin  and Cefepime  started for treatment of associated infection. Orthopedic surgery consulted with plan for right below knee amputation. - Continue antibiotics - Orthopedic surgery recommendations: plan for right BKA on 1/16  DKA Likely secondary to diabetic foot infection. Patient presented with a high anion gap acidosis with associated hyperglycemia and elevated beta-hydroxybutyric acid. Insulin  IV started for management with improvement of acidosis and closure of anion gap. Patient transitioned to Novolog  70/30 20 units BID.   Diabetes mellitus type 2 Poorly controlled based on DKA and hemoglobin A1C of 9.2%. Prior to arrival medication(s) include Novolog  70/30. Patient initially treated for DKA and transitioned to home Novolog  70/30. Diabetes coordinator with recommendation to transition to long-acting basal insulin  at this time. - Discontinue Novolog  70/30 - Continue SSI - Start Lantus  15 units at bedtime  Leukocytosis Secondary to foot infection. Anticipate improvement once foot infection has been addressed definitively.  Primary hypertension - Continue amlodipine   History of lung cancer Noted.  Normocytic anemia anemia Chronic and stable. Likely secondary to anemia of chronic disease/kidney disease  Memory  impairment Concern from family. Recommend outpatient neuropsych testing/evaluation.  CKD stage IV Baseline creatinine appears to be around 2.2 to 2.4. Currently stable.   DVT prophylaxis: SCDs Code Status:   Code Status: Full Code Family Communication: None at bedside. Family requested through nursing for contact from the attending physician. Permission to speak to family requested from patient, for which he declined multiple times, stating it's complicated. Patient was alert and oriented when inquiring about contacting family and had capacity. Disposition Plan: Discharge pending ongoing orthopedic surgery recommendations and eventual PT/OT recommendations   Consultants:  Orthopedic surgery  Procedures:  None  Antimicrobials: Vancomycin  Cefepime     Subjective: Patient reports no concerns today. Awaiting surgery.  Objective: BP 130/68 (BP Location: Right Arm)   Pulse 67   Temp 98.5 F (36.9 C) (Oral)   Resp 16   SpO2 99%   Examination:  General exam: Appears calm and comfortable. Foul smell in the room. Respiratory system: Clear to auscultation. Respiratory effort normal. Cardiovascular system: S1 & S2 heard, RRR. No murmur. Gastrointestinal system: Abdomen is nondistended, soft and nontender. Normal bowel sounds heard. Central nervous system: Alert and oriented. No focal neurological deficits. Musculoskeletal: No edema. No calf tenderness Skin: Gangrene of right 2nd-5th toes. Psychiatry: Judgement and insight appear normal. Mood & affect appropriate.    Data Reviewed: I have personally reviewed following labs and imaging studies   Last CBC Lab Results  Component Value Date   WBC 18.3 (H) 04/03/2024   HGB 8.2 (L) 04/03/2024   HCT 24.0 (L) 04/03/2024   MCV 99.7 04/03/2024   MCH 32.1 04/03/2024   RDW 12.3 04/03/2024   PLT 391 04/03/2024     Last metabolic panel Lab Results  Component Value Date   GLUCOSE 204 (H) 04/04/2024   NA 144 04/04/2024  K 3.8  04/04/2024   CL 108 04/04/2024   CO2 24 04/04/2024   BUN 59 (H) 04/04/2024   CREATININE 2.36 (H) 04/04/2024   GFRNONAA 28 (L) 04/04/2024   CALCIUM 9.4 04/04/2024   PROT 7.4 04/03/2024   ALBUMIN 3.2 (L) 04/03/2024   BILITOT 0.5 04/03/2024   ALKPHOS 149 (H) 04/03/2024   AST 16 04/03/2024   ALT 24 04/03/2024   ANIONGAP 12 04/04/2024     Creatinine Clearance: Estimated Creatinine Clearance: 25.6 mL/min (A) (by C-G formula based on SCr of 2.36 mg/dL (H)).  No results found for this or any previous visit (from the past 240 hours).    Radiology Studies: VAS US  ABI WITH/WO TBI Result Date: 04/03/2024  LOWER EXTREMITY DOPPLER STUDY Patient Name:  Gene Fletcher  Date of Exam:   04/03/2024 Medical Rec #: [id number]            Accession #:    [id number] Date of Birth: February 13, 1951            Patient Gender: M Patient Age:   20 years Exam Location:  [facility] Procedure:      VAS US  ABI WITH/WO TBI Referring Phys: [name] JEFFERY --------------------------------------------------------------------------------  Indications: Gangrene. High Risk Factors: Hypertension, hyperlipidemia, Diabetes.  Limitations: Today's exam was limited due to an open wound, involuntary patient              movement and Left upper arm IV. Comparison Study: No prior studies. Performing Technologist: Gerome Ny RVT  Examination Guidelines: A complete evaluation includes at minimum, Doppler waveform signals and systolic blood pressure reading at the level of bilateral brachial, anterior tibial, and posterior tibial arteries, when vessel segments are accessible. Bilateral testing is considered an integral part of a complete examination. Photoelectric Plethysmograph (PPG) waveforms and toe systolic pressure readings are included as required and additional duplex testing as needed. Limited examinations for reoccurring indications may be performed as noted.  ABI Findings:  +--------+------------------+-----+-----------+--------+ Right   Rt Pressure (mmHg)IndexWaveform   Comment  +--------+------------------+-----+-----------+--------+ [id number]                    triphasic           +--------+------------------+-----+-----------+--------+ PTA     151               0.98 multiphasic         +--------+------------------+-----+-----------+--------+ DP      86                0.56 monophasic          +--------+------------------+-----+-----------+--------+ +--------+------------------+-----+----------+-------+ Left    Lt Pressure (mmHg)IndexWaveform  Comment +--------+------------------+-----+----------+-------+ Brachial                                 IV      +--------+------------------+-----+----------+-------+ PTA     83                0.54 monophasic        +--------+------------------+-----+----------+-------+ DP                             absent            +--------+------------------+-----+----------+-------+ +-------+-----------+-----------+------------+------------+ ABI/TBIToday's ABIToday's TBIPrevious ABIPrevious TBI +-------+-----------+-----------+------------+------------+ Right  0.98                                           +-------+-----------+-----------+------------+------------+  Left   0.54                                           +-------+-----------+-----------+------------+------------+  Spoke with Dr. Harden who states toe pressures are not necessary.  Summary: Right: Resting right ankle-brachial index is within normal range.  ABI is likely falsely elevated due to medial calcification. 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  *See table(s) above for measurements and observations.  Electronically signed by Lonni Gaskins MD on 04/03/2024 at 8:12:33 PM.    Final    DG Foot 2 Views Right Result Date: 04/03/2024 CLINICAL DATA:  Necrosis.  EXAM: RIGHT FOOT - 2 VIEW COMPARISON:  None Available. FINDINGS: Mid and forefoot soft tissue gas. No definite underlying osseous erosion to suggest osteomyelitis. IMPRESSION: Mid and forefoot necrotizing fasciitis. No definitive evidence of underlying osteomyelitis. Electronically Signed   By: Newell Eke M.D.   On: 04/03/2024 14:31   DG Chest 2 View Result Date: 04/03/2024 CLINICAL DATA:  Infection EXAM: CHEST - 2 VIEW COMPARISON:  October 16, 2023.  March 26, 2024. FINDINGS: The heart size and mediastinal contours are within normal limits. Right midlung nodular density is noted which corresponds to the nodule described on recent CT scan. Left lung is clear. The visualized skeletal structures are unremarkable. IMPRESSION: Right midlung nodular density is noted which corresponds to the nodule described on recent CT scan. Electronically Signed   By: Lynwood Landy Raddle M.D.   On: 04/03/2024 11:25      LOS: 1 day    Elgin Lam, MD [facility] 04/04/2024, 7:33 AM   If 7PM-7AM, please contact night-coverage [email or web address]  "

## 2024-04-04 NOTE — ED Notes (Signed)
 Attempted to stick patient for labs, unsuccessful. Phlebotomy contacted.

## 2024-04-05 ENCOUNTER — Inpatient Hospital Stay (HOSPITAL_COMMUNITY): Admitting: Anesthesiology

## 2024-04-05 ENCOUNTER — Inpatient Hospital Stay (HOSPITAL_COMMUNITY)

## 2024-04-05 ENCOUNTER — Encounter (HOSPITAL_COMMUNITY): Admission: EM | Payer: Self-pay | Source: Home / Self Care | Attending: Internal Medicine

## 2024-04-05 ENCOUNTER — Encounter (HOSPITAL_COMMUNITY): Payer: Self-pay | Admitting: Internal Medicine

## 2024-04-05 DIAGNOSIS — D649 Anemia, unspecified: Secondary | ICD-10-CM | POA: Diagnosis not present

## 2024-04-05 DIAGNOSIS — E11628 Type 2 diabetes mellitus with other skin complications: Secondary | ICD-10-CM | POA: Diagnosis not present

## 2024-04-05 DIAGNOSIS — L089 Local infection of the skin and subcutaneous tissue, unspecified: Secondary | ICD-10-CM | POA: Diagnosis not present

## 2024-04-05 DIAGNOSIS — E785 Hyperlipidemia, unspecified: Secondary | ICD-10-CM

## 2024-04-05 DIAGNOSIS — E1152 Type 2 diabetes mellitus with diabetic peripheral angiopathy with gangrene: Secondary | ICD-10-CM

## 2024-04-05 DIAGNOSIS — I10 Essential (primary) hypertension: Secondary | ICD-10-CM | POA: Diagnosis not present

## 2024-04-05 DIAGNOSIS — I96 Gangrene, not elsewhere classified: Secondary | ICD-10-CM | POA: Diagnosis not present

## 2024-04-05 HISTORY — PX: AMPUTATION: SHX166

## 2024-04-05 LAB — BASIC METABOLIC PANEL WITH GFR
Anion gap: 11 (ref 5–15)
BUN: 47 mg/dL — ABNORMAL HIGH (ref 8–23)
CO2: 25 mmol/L (ref 22–32)
Calcium: 9.4 mg/dL (ref 8.9–10.3)
Chloride: 110 mmol/L (ref 98–111)
Creatinine, Ser: 2 mg/dL — ABNORMAL HIGH (ref 0.61–1.24)
GFR, Estimated: 34 mL/min — ABNORMAL LOW
Glucose, Bld: 202 mg/dL — ABNORMAL HIGH (ref 70–99)
Potassium: 4 mmol/L (ref 3.5–5.1)
Sodium: 146 mmol/L — ABNORMAL HIGH (ref 135–145)

## 2024-04-05 LAB — CBC
HCT: 27 % — ABNORMAL LOW (ref 39.0–52.0)
Hemoglobin: 8.9 g/dL — ABNORMAL LOW (ref 13.0–17.0)
MCH: 32.1 pg (ref 26.0–34.0)
MCHC: 33 g/dL (ref 30.0–36.0)
MCV: 97.5 fL (ref 80.0–100.0)
Platelets: 288 K/uL (ref 150–400)
RBC: 2.77 MIL/uL — ABNORMAL LOW (ref 4.22–5.81)
RDW: 12.5 % (ref 11.5–15.5)
WBC: 20.5 K/uL — ABNORMAL HIGH (ref 4.0–10.5)
nRBC: 0 % (ref 0.0–0.2)

## 2024-04-05 LAB — SURGICAL PCR SCREEN
MRSA, PCR: NEGATIVE
Staphylococcus aureus: NEGATIVE

## 2024-04-05 LAB — GLUCOSE, CAPILLARY
Glucose-Capillary: 174 mg/dL — ABNORMAL HIGH (ref 70–99)
Glucose-Capillary: 161 mg/dL — ABNORMAL HIGH (ref 70–99)
Glucose-Capillary: 164 mg/dL — ABNORMAL HIGH (ref 70–99)
Glucose-Capillary: 164 mg/dL — ABNORMAL HIGH (ref 70–99)
Glucose-Capillary: 162 mg/dL — ABNORMAL HIGH (ref 70–99)
Glucose-Capillary: 211 mg/dL — ABNORMAL HIGH (ref 70–99)

## 2024-04-05 LAB — NO BLOOD PRODUCTS

## 2024-04-05 MED ORDER — JUVEN PO PACK
1.0000 | PACK | Freq: Two times a day (BID) | ORAL | Status: DC
  Administered 2024-04-06 – 2024-04-15 (×19): 1 via ORAL
  Filled 2024-04-05 (×17): qty 1

## 2024-04-05 MED ORDER — ACETAMINOPHEN 500 MG PO TABS: 1000.0000 mg | ORAL_TABLET | Freq: Four times a day (QID) | ORAL | Status: DC

## 2024-04-05 MED ORDER — DOCUSATE SODIUM 100 MG PO CAPS
200.0000 mg | ORAL_CAPSULE | Freq: Two times a day (BID) | ORAL | Status: DC
  Administered 2024-04-05 – 2024-04-14 (×17): 200 mg via ORAL
  Filled 2024-04-05 (×20): qty 2

## 2024-04-05 MED ORDER — HYDROMORPHONE HCL 1 MG/ML IJ SOLN: 0.5000 mg | INTRAMUSCULAR | Status: DC | PRN

## 2024-04-05 MED ORDER — LIDOCAINE 2% (20 MG/ML) 5 ML SYRINGE
INTRAMUSCULAR | Status: AC
  Filled 2024-04-05: qty 5

## 2024-04-05 MED ORDER — ASPIRIN 81 MG PO TBEC
81.0000 mg | DELAYED_RELEASE_TABLET | Freq: Every day | ORAL | Status: DC
  Administered 2024-04-06 – 2024-04-10 (×4): 81 mg via ORAL
  Filled 2024-04-05 (×5): qty 1

## 2024-04-05 MED ORDER — ORAL CARE MOUTH RINSE: 15.0000 mL | Freq: Once | OROMUCOSAL | Status: AC

## 2024-04-05 MED ORDER — POTASSIUM CHLORIDE CRYS ER 20 MEQ PO TBCR: 40.0000 meq | EXTENDED_RELEASE_TABLET | Freq: Every day | ORAL | Status: DC | PRN

## 2024-04-05 MED ORDER — ACETAMINOPHEN 500 MG PO TABS
1000.0000 mg | ORAL_TABLET | Freq: Once | ORAL | Status: AC
  Administered 2024-04-05: 1000 mg via ORAL
  Filled 2024-04-05: qty 2

## 2024-04-05 MED ORDER — FERROUS SULFATE 325 (65 FE) MG PO TABS
325.0000 mg | ORAL_TABLET | Freq: Two times a day (BID) | ORAL | Status: DC
  Administered 2024-04-05 – 2024-04-08 (×8): 325 mg via ORAL
  Filled 2024-04-05 (×8): qty 1

## 2024-04-05 MED ORDER — INSULIN GLARGINE 100 UNIT/ML ~~LOC~~ SOLN
12.0000 [IU] | Freq: Every day | SUBCUTANEOUS | Status: DC
  Administered 2024-04-05 – 2024-04-12 (×8): 12 [IU] via SUBCUTANEOUS
  Filled 2024-04-05 (×9): qty 0.12

## 2024-04-05 MED ORDER — OXYCODONE HCL 5 MG PO TABS: 5.0000 mg | ORAL_TABLET | ORAL | Status: DC | PRN

## 2024-04-05 MED ORDER — RISAQUAD PO CAPS
1.0000 | ORAL_CAPSULE | Freq: Every day | ORAL | Status: DC
  Administered 2024-04-05 – 2024-04-15 (×11): 1 via ORAL
  Filled 2024-04-05 (×11): qty 1

## 2024-04-05 MED ORDER — BUPIVACAINE-EPINEPHRINE (PF) 0.25% -1:200000 IJ SOLN
INTRAMUSCULAR | Status: DC | PRN
  Administered 2024-04-05: 15 mL via PERINEURAL
  Administered 2024-04-05: 25 mL via PERINEURAL

## 2024-04-05 MED ORDER — ONDANSETRON HCL 4 MG/2ML IJ SOLN
INTRAMUSCULAR | Status: DC | PRN
  Administered 2024-04-05: 4 mg via INTRAVENOUS

## 2024-04-05 MED ORDER — PHENOL 1.4 % MT LIQD: 1.0000 | OROMUCOSAL | Status: AC | PRN

## 2024-04-05 MED ORDER — BISACODYL 10 MG RE SUPP
10.0000 mg | Freq: Once | RECTAL | Status: AC
  Administered 2024-04-05: 10 mg via RECTAL
  Filled 2024-04-05: qty 1

## 2024-04-05 MED ORDER — SODIUM CHLORIDE 0.9 % IV SOLN: INTRAVENOUS | Status: DC

## 2024-04-05 MED ORDER — FOLIC ACID 1 MG PO TABS
1.0000 mg | ORAL_TABLET | Freq: Every day | ORAL | Status: DC
  Administered 2024-04-05 – 2024-04-15 (×11): 1 mg via ORAL
  Filled 2024-04-05 (×11): qty 1

## 2024-04-05 MED ORDER — PHENOL 1.4 % MT LIQD: 1.0000 | OROMUCOSAL | Status: DC | PRN

## 2024-04-05 MED ORDER — ACETAMINOPHEN 325 MG PO TABS: 325.0000 mg | ORAL_TABLET | Freq: Four times a day (QID) | ORAL | Status: DC | PRN

## 2024-04-05 MED ORDER — LABETALOL HCL 5 MG/ML IV SOLN: 10.0000 mg | INTRAVENOUS | Status: DC | PRN

## 2024-04-05 MED ORDER — CHLORHEXIDINE GLUCONATE 0.12 % MT SOLN
15.0000 mL | Freq: Once | OROMUCOSAL | Status: AC
  Administered 2024-04-05: 15 mL via OROMUCOSAL
  Filled 2024-04-05: qty 15

## 2024-04-05 MED ORDER — FENTANYL CITRATE (PF) 100 MCG/2ML IJ SOLN: 100.0000 ug | Freq: Once | INTRAMUSCULAR | Status: AC

## 2024-04-05 MED ORDER — ZINC SULFATE 220 (50 ZN) MG PO CAPS
220.0000 mg | ORAL_CAPSULE | Freq: Every day | ORAL | Status: DC
  Administered 2024-04-05 – 2024-04-15 (×11): 220 mg via ORAL
  Filled 2024-04-05 (×11): qty 1

## 2024-04-05 MED ORDER — ONDANSETRON HCL 4 MG/2ML IJ SOLN: 4.0000 mg | Freq: Once | INTRAMUSCULAR | Status: DC | PRN

## 2024-04-05 MED ORDER — OXYCODONE HCL 5 MG PO TABS: 10.0000 mg | ORAL_TABLET | ORAL | Status: DC | PRN

## 2024-04-05 MED ORDER — HYDRALAZINE HCL 20 MG/ML IJ SOLN: 10.0000 mg | Freq: Four times a day (QID) | INTRAMUSCULAR | Status: DC | PRN

## 2024-04-05 MED ORDER — ACETAMINOPHEN 500 MG PO TABS
1000.0000 mg | ORAL_TABLET | Freq: Four times a day (QID) | ORAL | Status: AC
  Administered 2024-04-05 – 2024-04-06 (×3): 1000 mg via ORAL
  Filled 2024-04-05 (×4): qty 2

## 2024-04-05 MED ORDER — METOPROLOL TARTRATE 5 MG/5ML IV SOLN: 2.0000 mg | INTRAVENOUS | Status: DC | PRN

## 2024-04-05 MED ORDER — PANTOPRAZOLE SODIUM 40 MG PO TBEC
40.0000 mg | DELAYED_RELEASE_TABLET | Freq: Two times a day (BID) | ORAL | Status: DC
  Administered 2024-04-05 – 2024-04-15 (×21): 40 mg via ORAL
  Filled 2024-04-05 (×21): qty 1

## 2024-04-05 MED ORDER — ONDANSETRON HCL 4 MG/2ML IJ SOLN
INTRAMUSCULAR | Status: AC
  Filled 2024-04-05: qty 2

## 2024-04-05 MED ORDER — LIDOCAINE 2% (20 MG/ML) 5 ML SYRINGE
INTRAMUSCULAR | Status: DC | PRN
  Administered 2024-04-05: 40 mg via INTRAVENOUS

## 2024-04-05 MED ORDER — PANTOPRAZOLE SODIUM 40 MG IV SOLR: 40.0000 mg | Freq: Two times a day (BID) | INTRAVENOUS | Status: DC

## 2024-04-05 MED ORDER — METOCLOPRAMIDE HCL 5 MG/ML IJ SOLN
INTRAMUSCULAR | Status: AC
  Filled 2024-04-05: qty 2

## 2024-04-05 MED ORDER — CHLORPROMAZINE HCL 25 MG/ML IJ SOLN
25.0000 mg | Freq: Three times a day (TID) | INTRAMUSCULAR | Status: AC | PRN
  Administered 2024-04-05 – 2024-04-16 (×8): 25 mg via INTRAMUSCULAR
  Filled 2024-04-05 (×12): qty 1

## 2024-04-05 MED ORDER — FENTANYL CITRATE (PF) 100 MCG/2ML IJ SOLN
INTRAMUSCULAR | Status: AC
  Filled 2024-04-05: qty 2

## 2024-04-05 MED ORDER — TAMSULOSIN HCL 0.4 MG PO CAPS
0.4000 mg | ORAL_CAPSULE | Freq: Every day | ORAL | Status: AC
  Administered 2024-04-05 – 2024-04-25 (×21): 0.4 mg via ORAL
  Filled 2024-04-05 (×22): qty 1

## 2024-04-05 MED ORDER — AMLODIPINE BESYLATE 10 MG PO TABS
10.0000 mg | ORAL_TABLET | Freq: Every day | ORAL | Status: DC
  Administered 2024-04-05: 10 mg via ORAL
  Filled 2024-04-05 (×2): qty 1

## 2024-04-05 MED ORDER — PROPOFOL 10 MG/ML IV BOLUS
INTRAVENOUS | Status: DC | PRN
  Administered 2024-04-05: 120 mg via INTRAVENOUS

## 2024-04-05 MED ORDER — ISOSORBIDE MONONITRATE ER 30 MG PO TB24
30.0000 mg | ORAL_TABLET | Freq: Every day | ORAL | Status: DC
  Administered 2024-04-05: 30 mg via ORAL
  Filled 2024-04-05 (×2): qty 1

## 2024-04-05 MED ORDER — OXYCODONE HCL 5 MG PO TABS: 5.0000 mg | ORAL_TABLET | Freq: Once | ORAL | Status: DC | PRN

## 2024-04-05 MED ORDER — POVIDONE-IODINE 10 % EX SWAB: 2.0000 | Freq: Once | CUTANEOUS | Status: DC

## 2024-04-05 MED ORDER — PHENYLEPHRINE 80 MCG/ML (10ML) SYRINGE FOR IV PUSH (FOR BLOOD PRESSURE SUPPORT)
PREFILLED_SYRINGE | INTRAVENOUS | Status: DC | PRN
  Administered 2024-04-05: 80 ug via INTRAVENOUS
  Administered 2024-04-05: 240 ug via INTRAVENOUS
  Administered 2024-04-05: 160 ug via INTRAVENOUS

## 2024-04-05 MED ORDER — FENTANYL CITRATE (PF) 100 MCG/2ML IJ SOLN
INTRAMUSCULAR | Status: AC
  Administered 2024-04-05: 100 ug via INTRAVENOUS
  Filled 2024-04-05: qty 2

## 2024-04-05 MED ORDER — PROPOFOL 10 MG/ML IV BOLUS
INTRAVENOUS | Status: AC
  Filled 2024-04-05: qty 20

## 2024-04-05 MED ORDER — FENTANYL CITRATE (PF) 100 MCG/2ML IJ SOLN: 25.0000 ug | INTRAMUSCULAR | Status: DC | PRN

## 2024-04-05 MED ORDER — DEXTROSE 5 % IV SOLN: INTRAVENOUS | Status: AC

## 2024-04-05 MED ORDER — VITAMIN C 500 MG PO TABS
1000.0000 mg | ORAL_TABLET | Freq: Every day | ORAL | Status: DC
  Administered 2024-04-05 – 2024-04-15 (×11): 1000 mg via ORAL
  Filled 2024-04-05 (×11): qty 2

## 2024-04-05 MED ORDER — VITAMIN D (ERGOCALCIFEROL) 1.25 MG (50000 UNIT) PO CAPS
50000.0000 [IU] | ORAL_CAPSULE | Freq: Every day | ORAL | Status: AC
  Administered 2024-04-05: 50000 [IU] via ORAL
  Filled 2024-04-05: qty 1

## 2024-04-05 MED ORDER — PHENYLEPHRINE 80 MCG/ML (10ML) SYRINGE FOR IV PUSH (FOR BLOOD PRESSURE SUPPORT)
PREFILLED_SYRINGE | INTRAVENOUS | Status: AC
  Filled 2024-04-05: qty 10

## 2024-04-05 MED ORDER — OXYCODONE HCL 5 MG/5ML PO SOLN: 5.0000 mg | Freq: Once | ORAL | Status: DC | PRN

## 2024-04-05 MED ORDER — VASHE WOUND IRRIGATION OPTIME
TOPICAL | Status: DC | PRN
  Administered 2024-04-05: 34 [oz_av]

## 2024-04-05 NOTE — Transfer of Care (Signed)
 Immediate Anesthesia Transfer of Care Note  Patient: Gene Fletcher  Procedure(s) Performed: AMPUTATION BELOW KNEE (Right: Knee)  Patient Location: PACU  Anesthesia Type:General  Level of Consciousness: drowsy  Airway & Oxygen Therapy: Patient Spontanous Breathing and Patient connected to nasal cannula oxygen  Post-op Assessment: Report given to RN and Post -op Vital signs reviewed and stable  Post vital signs: Reviewed and stable  Last Vitals:  Vitals Value Taken Time  BP 99/61 04/05/24 15:19  Temp 36.7 C 04/05/24 15:15  Pulse 80 04/05/24 15:20  Resp 18 04/05/24 15:20  SpO2 97 % 04/05/24 15:20  Vitals shown include unfiled device data.  Last Pain:  Vitals:   04/05/24 1330  TempSrc: Oral  PainSc:          Complications: There were no known notable events for this encounter.

## 2024-04-05 NOTE — Interval H&P Note (Signed)
 History and Physical Interval Note:  04/05/2024 6:58 AM  Gene Fletcher  has presented today for surgery, with the diagnosis of Right Foot Wound.  The various methods of treatment have been discussed with the patient and family. After consideration of risks, benefits and other options for treatment, the patient has consented to  Procedures: AMPUTATION BELOW KNEE (Right) as a surgical intervention.  The patient's history has been reviewed, patient examined, no change in status, stable for surgery.  I have reviewed the patient's chart and labs.  Questions were answered to the patient's satisfaction.     Garnett Rekowski V Yelina Sarratt

## 2024-04-05 NOTE — Anesthesia Preprocedure Evaluation (Addendum)
"                                    Anesthesia Evaluation  Patient identified by MRN, date of birth, ID band Patient awake    Reviewed: Allergy & Precautions, NPO status , Patient's Chart, lab work & pertinent test results  History of Anesthesia Complications Negative for: history of anesthetic complications  Airway Mallampati: II  TM Distance: >3 FB Neck ROM: Full    Dental  (+) Dental Advisory Given   Pulmonary neg pulmonary ROS   Pulmonary exam normal        Cardiovascular hypertension, Pt. on medications Normal cardiovascular exam     Neuro/Psych negative neurological ROS  negative psych ROS   GI/Hepatic negative GI ROS, Neg liver ROS,,,  Endo/Other  diabetes, Poorly Controlled, Type 2, Insulin  Dependent   Na 146   Renal/GU CRFRenal disease     Musculoskeletal  (+) Arthritis ,    Abdominal   Peds  Hematology  (+) Blood dyscrasia, anemia , REFUSES BLOOD PRODUCTS  Anesthesia Other Findings Noncompliance   Reproductive/Obstetrics                              Anesthesia Physical Anesthesia Plan  ASA: 3  Anesthesia Plan: General   Post-op Pain Management: Tylenol  PO (pre-op)* and Regional block*   Induction: Intravenous  PONV Risk Score and Plan: 2 and Treatment may vary due to age or medical condition, Ondansetron  and Dexamethasone   Airway Management Planned: LMA  Additional Equipment: None  Intra-op Plan:   Post-operative Plan: Extubation in OR  Informed Consent: I have reviewed the patients History and Physical, chart, labs and discussed the procedure including the risks, benefits and alternatives for the proposed anesthesia with the patient or authorized representative who has indicated his/her understanding and acceptance.     Dental advisory given  Plan Discussed with: CRNA and Anesthesiologist  Anesthesia Plan Comments: (Ok for albumin, refuses all other blood products )          Anesthesia Quick Evaluation  "

## 2024-04-05 NOTE — Anesthesia Postprocedure Evaluation (Signed)
"   Anesthesia Post Note  Patient: Gene Fletcher  Procedure(s) Performed: AMPUTATION BELOW KNEE (Right: Knee)     Patient location during evaluation: PACU Anesthesia Type: General Level of consciousness: awake and alert Pain management: pain level controlled Vital Signs Assessment: post-procedure vital signs reviewed and stable Respiratory status: spontaneous breathing, nonlabored ventilation and respiratory function stable Cardiovascular status: stable and blood pressure returned to baseline Anesthetic complications: no   There were no known notable events for this encounter.  Last Vitals:  Vitals:   04/05/24 1530 04/05/24 1545  BP: 102/64 99/63  Pulse: 84 82  Resp: 13 13  Temp:  36.6 C  SpO2: 98% 97%    Last Pain:  Vitals:   04/05/24 1545  TempSrc: Oral  PainSc: 0-No pain                 [name] Like      "

## 2024-04-05 NOTE — TOC Initial Note (Signed)
 Transition of Care [facility]) - Initial/Assessment Note    Patient Details  Name: Gene Fletcher MRN: [id number] Date of Birth: Oct 13, 1950  Transition of Care [facility]) CM/SW Contact:    Inocente GORMAN Kindle, LCSW Phone Number: 04/05/2024, 3:24 PM  Clinical Narrative:                 Patient admitted from home with wife who has Dementia. Arch Ada, APS Social Worker [phone]) following as patient is unsafe to return home due to cognitive issues. Brother requesting placement in [place] area. CSW requested [facility] and [facility] review referral (their liaisons can review in the [place]). [facility] and Rehab and [facility] and Rehab only offer short term.   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, SNF Pending bed offer   Patient Goals and CMS Choice Patient states their goals for this hospitalization and ongoing recovery are:: Rehab CMS Medicare.gov Compare Post Acute Care list provided to:: Patient Represenative (must comment) Choice offered to / list presented to : Sibling [place] ownership interest in [facility].provided to:: Sibling    Expected Discharge Plan and Services In-house Referral: Clinical Social Work   Post Acute Care Choice: Skilled Nursing Facility Living arrangements for the past 2 months: Single Family Home                                      Prior Living Arrangements/Services Living arrangements for the past 2 months: Single Family Home Lives with:: Spouse (Spouse has dementia) Patient language and need for interpreter reviewed:: Yes Do you feel safe going back to the place where you live?: Yes      Need for Family Participation in Patient Care: Yes (Comment) Care giver support system in place?: Yes (comment)   Criminal Activity/Legal Involvement Pertinent to Current  Situation/Hospitalization: No - Comment as needed  Activities of Daily Living   ADL Screening (condition at time of admission) Independently performs ADLs?: Yes (appropriate for developmental age) Is the patient deaf or have difficulty hearing?: No Does the patient have difficulty seeing, even when wearing glasses/contacts?: No Does the patient have difficulty concentrating, remembering, or making decisions?: No  Permission Sought/Granted Permission sought to share information with : Facility Medical Sales Representative, Family Supports Permission granted to share information with : Yes, Verbal Permission Granted  Share Information with NAME: pt brother, Rayshard Schirtzinger ([phone])  and  Arch Ada, APS Social Worker, at [phone]  Permission granted to share info w AGENCY: SNFs        Emotional Assessment Appearance:: Appears stated age   Affect (typically observed): Accepting Orientation: : Oriented to Self, Oriented to Place, Oriented to  Time, Oriented to Situation, Fluctuating Orientation (Suspected and/or reported Sundowners) Alcohol  / Substance Use: Not Applicable Psych Involvement: No (comment)  Admission diagnosis:  Diabetic foot infection (HCC) [Z88.371, L08.9] Gangrene of right foot [facility]) [I96] Patient Active Problem List   Diagnosis Date Noted   Gangrene of right foot (HCC) 04/03/2024   No transfusions per religious beliefs (Jehovahs Witnesses) 04/03/2024   Diabetic foot infection (HCC) 04/03/2024   DKA (diabetic ketoacidosis) (HCC) 04/03/2024   Malignant neoplasm of lower lobe of right lung (HCC) 11/07/2023   Malignant 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) 10/16/2023   Lung nodules 10/04/2023   Adenopathy  10/04/2023   Insulin  long-term use (HCC) 07/04/2022   Leg edema 07/04/2022   Chronic kidney disease, stage 4 (severe) (HCC) 07/04/2022   Noncompliance with treatment 02/12/2018   Anemia 07/06/2015   Diabetic renal disease (HCC) 12/23/2014    Encounter for general adult medical examination without abnormal findings 12/16/2014   Diabetic retinopathy associated with type 2 diabetes mellitus (HCC) 12/18/2013   Type 2 diabetes mellitus without complications (HCC) 09/24/2009   Hyperlipidemia 09/24/2009   Hyperglycemia due to type 2 diabetes mellitus (HCC) 09/24/2009   Essential hypertension 09/24/2009   Encounter for other general examination 09/24/2009   PCP:  [name], MD Pharmacy:   [facility]) - [place], [place] - [address] [place] [address] [place] [address] Phone: [phone] Fax: [phone]  [facility] [address] [place] [address] Phone: [phone] Fax: [phone]  [facility] [address] [place] [address] Phone: [phone] Fax: [phone]  [place] [facility] [address] [place] [address] Phone: [phone] Fax: [phone]  [id number] [place], [place] - [address] [place] [address] Phone: [phone] Fax: [phone]     Social Drivers of Health (SDOH) Social History: SDOH Screenings   Food Insecurity: No Food Insecurity (04/03/2024)  Housing: Low Risk (04/03/2024)  Transportation Needs: No Transportation Needs (04/03/2024)  Utilities: Not At Risk (04/03/2024)  Depression (PHQ2-9): Low Risk (11/07/2023)  Social Connections: Socially Integrated (04/03/2024)  Tobacco Use: Low Risk (04/05/2024)   SDOH Interventions:     Readmission Risk Interventions     No data to display

## 2024-04-05 NOTE — Progress Notes (Signed)
 "                                                                                                                                                                                                                                                                                PROGRESS NOTE     Patient Demographics:    Gene Fletcher, is a 74 y.o. male, DOB - 1951-01-09, FMW:[id number]  Outpatient Primary MD for the patient is [name], MD    LOS - 2  Admit date - 04/03/2024    Chief Complaint  Patient presents with   Foot Pain       Brief Narrative (HPI from H&P)   74 y.o. male with a history of hypertension, hyperlipidemia, diabetes mellitus type 2, CKD stage IV, lung cancer s/p radiation, chronic anemia.  Patient presented secondary to a left foot wound with evidence of infection and dry gangrene. Empiric antibiotics started. Orthopedic surgery consulted and plan a left below-knee amputation.    Subjective:    Gene Fletcher today has, No headache, No chest pain, No abdominal pain - No Nausea, No new weakness tingling or numbness, no shortness of breath looking forward to undergoing surgery,   Assessment  & Plan :   Diabetic foot infection, wet gangrene of the right foot - Patient with wet gangrene of the second, third, fourth and fifth digits of right foot. Empiric Vancomycin  and Cefepime  started for treatment of associated infection. 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 Harden on 04/05/2024 he has moderate disease but sufficient for post amputation healing per Dr. Harden.  Patient refuses blood transfusion due to personal reasons, will place him on iron  and folic acid , monitor CBC closely minimize blood draws..   DKA patient with DM type II - Likely precipitated by foot gangrene, initially was on insulin  drip, now transition to Lantus  insulin  along with SSI, was taking  7030 at home, diabetic education provided.  Monitor and adjust.  DKA has resolved.  Gentle D5W drip while NPO.  Lab Results  Component Value Date   HGBA1C 9.2 (H) 04/03/2024  CBG (last 3)  Recent Labs    04/04/24 1606 04/04/24 2122 04/05/24 0755  GLUCAP 177* 173* 174*     Leukocytosis Secondary to foot infection. Anticipate improvement once foot infection has been addressed definitively.   Primary hypertension - - Continue amlodipine , as needed hydralazine    History of lung cancer with nonspecific right-sided chest x-ray changes.  Follow-up with PCP and primary oncologist postdischarge.   Normocytic anemia anemia Chronic and stable. Likely secondary to anemia of chronic disease/kidney disease, as he does not want any blood transfusions placed on iron  and folic acid  preop.  Monitor closely, minimize blood draws.   Memory impairment Concern from family. Recommend outpatient neuropsych testing/evaluation.   Mild hypernatremia, CKD stage IV - Baseline creatinine appears to be around 2.2 to 2.4.  Creatinine close to baseline gentle D5W drip on 04/05/2024.   Moderate bilateral PAD.  Glycemic control, check lipid panel tomorrow, will place on aspirin , statin if needed, outpatient follow-up with vascular surgery.       Condition -   Guarded  Family Communication  : Brother bedside on 04/05/2024  Code Status : Full code I  Consults  : Dr. Harden  PUD Prophylaxis : PPI   Procedures  :     ABI.  Moderate bilateral disease discussed with Dr. Harden.      Disposition Plan  :    Status is: Inpatient   DVT Prophylaxis  :    SCDs Start: 04/03/24 1433    Lab Results  Component Value Date   PLT 288 04/05/2024    Diet :  Diet Order             Diet NPO time specified Except for: Sips with Meds  Diet effective ____                    Inpatient Medications  Scheduled Meds:  amLODipine   10 mg Oral Daily   atropine   1 drop Both Eyes Daily   bisacodyl   10 mg  Rectal Once   insulin  aspart  0-9 Units Subcutaneous TID WC   insulin  glargine  15 Units Subcutaneous QHS   isosorbide  mononitrate  30 mg Oral Daily   linezolid   600 mg Oral Q12H   pantoprazole   40 mg Oral BID   prednisoLONE  acetate  1 drop Both Eyes QID   Continuous Infusions:  ceFEPime  (MAXIPIME ) IV Stopped (04/04/24 1820)   dextrose      metronidazole  Stopped (04/04/24 2240)   PRN Meds:.dextrose , hydrALAZINE , ondansetron  (ZOFRAN ) IV   Objective:   Vitals:   04/04/24 2000 04/05/24 0000 04/05/24 0400 04/05/24 0735  BP: (!) 159/78 (!) 162/92 (!) 164/84 (!) 143/74  Pulse: 73 79 90 65  Resp: 15 11 17 12   Temp: 99.1 F (37.3 C) 98.9 F (37.2 C) 98.7 F (37.1 C) 98.7 F (37.1 C)  TempSrc: Oral Oral Oral Oral  SpO2: 100% 97% 99% 99%    Wt Readings from Last 3 Encounters:  04/02/24 65.8 kg  03/26/24 69.9 kg  11/07/23 69.5 kg     Intake/Output Summary (Last 24 hours) at 04/05/2024 0803 Last data filed at 04/05/2024 9380 Gross per 24 hour  Intake 2064.88 ml  Output 750 ml  Net 1314.88 ml     Physical Exam  Awake Alert, No new F.N deficits, Normal affect [place].AT,PERRAL Supple Neck, No JVD,   Symmetrical Chest wall movement, Good air movement bilaterally, CTAB RRR,No Gallops,Rubs or new Murmurs,  +ve B.Sounds, Abd Soft, No tenderness,  Right foot gangrene noted with foul smell    Data Review:    Recent Labs  Lab 04/03/24 1039 04/03/24 2344 04/04/24 0734 04/05/24 0434  WBC 18.3*  --  21.7* 20.5*  HGB 9.8* 8.2* 8.5* 8.9*  HCT 30.4* 24.0* 26.1* 27.0*  PLT 391  --  296 288  MCV 99.7  --  98.5 97.5  MCH 32.1  --  32.1 32.1  MCHC 32.2  --  32.6 33.0  RDW 12.3  --  12.6 12.5  LYMPHSABS 0.3*  --   --   --   MONOABS 1.0  --   --   --   EOSABS 0.0  --   --   --   BASOSABS 0.0  --   --   --     Recent Labs  Lab 04/03/24 1039 04/03/24 1046 04/03/24 1205 04/03/24 1434 04/03/24 2344 04/04/24 0045 04/05/24 0434  NA 145  --   --   --  147* 144 146*  K 4.3   --   --   --  3.9 3.8 4.0  CL 105  --   --   --   --  108 110  CO2 20*  --   --   --   --  24 25  ANIONGAP 20*  --   --   --   --  12 11  GLUCOSE 497*  --   --   --   --  204* 202*  BUN 66*  --   --   --   --  59* 47*  CREATININE 2.55*  --   --   --   --  2.36* 2.00*  AST 16  --   --   --   --   --   --   ALT 24  --   --   --   --   --   --   ALKPHOS 149*  --   --   --   --   --   --   BILITOT 0.5  --   --   --   --   --   --   ALBUMIN 3.2*  --   --   --   --   --   --   CRP  --   --   --  21.2*  --   --   --   LATICACIDVEN  --  2.3* 1.9  --   --   --   --   HGBA1C  --   --   --  9.2*  --   --   --   CALCIUM 9.9  --   --   --   --  9.4 9.4      Recent Labs  Lab 04/03/24 1039 04/03/24 1046 04/03/24 1205 04/03/24 1434 04/04/24 0045 04/05/24 0434  CRP  --   --   --  21.2*  --   --   LATICACIDVEN  --  2.3* 1.9  --   --   --   HGBA1C  --   --   --  9.2*  --   --   CALCIUM 9.9  --   --   --  9.4 9.4    --------------------------------------------------------------------------------------------------------------- Lab Results  Component Value Date   CHOL 169 12/10/2009   HDL 28 (L) 12/10/2009   LDLCALC 119 (H) 12/10/2009   TRIG 112 12/10/2009   CHOLHDL 6.0 Ratio 12/10/2009  Lab Results  Component Value Date   HGBA1C 9.2 (H) 04/03/2024   No results for input(s): TSH, T4TOTAL, FREET4, T3FREE, THYROIDAB in the last 72 hours. No results for input(s): VITAMINB12, FOLATE, FERRITIN, TIBC, IRON , RETICCTPCT in the last 72 hours. ------------------------------------------------------------------------------------------------------------------ Cardiac Enzymes No results for input(s): CKMB, TROPONINI, MYOGLOBIN in the last 168 hours.  Invalid input(s): CK  Micro Results Recent Results (from the past 240 hours)  Surgical pcr screen     Status: None   Collection Time: 04/04/24 10:39 AM   Specimen: Nasal Mucosa; Nasal Swab  Result Value Ref Range  Status   MRSA, PCR NEGATIVE NEGATIVE Final   Staphylococcus aureus NEGATIVE NEGATIVE Final    Comment: (NOTE) The Xpert SA Assay (FDA approved for NASAL specimens in patients 66 years of age and older), is one component of a comprehensive surveillance program. It is not intended to diagnose infection nor to guide or monitor treatment. Performed at [facility], [address], [place], [place] [address]     Radiology Report DG Abd Portable 1V Result Date: 04/05/2024 EXAM: 1 VIEW XRAY OF THE ABDOMEN 04/05/2024 06:23:00 AM COMPARISON: Abdomen and pelvis CT 03/26/2024. CLINICAL HISTORY: Nausea \\T \ vomiting. FINDINGS: BOWEL: There is moderate stool retention. No dilated small bowel. Nonobstructive bowel gas pattern. SOFT TISSUES: No abnormal calcifications. BONES: Degenerative changes in the lumbar spine. Ankylosis of the upper left sacroiliac joint. No acute fracture. IMPRESSION: 1. No evidence of bowel obstruction. No supine evidence of free air. 2. Moderate colonic stool burden. Electronically signed by: Francis Quam MD 04/05/2024 06:45 AM EST RP Workstation: [id number]   DG Chest Port 1 View Result Date: 04/05/2024 EXAM: 1 VIEW(S) XRAY OF THE CHEST 04/05/2024 06:23:00 AM COMPARISON: Chest CT 03/26/2024, chest CT 09/14/2023, AP and lateral chest. CLINICAL HISTORY: SOB (shortness of breath). FINDINGS: LUNGS AND PLEURA: Right lower lobe perihilar mass or consolidation is again noted and appears at least mildly improved since 03/26/2024. There is faint visualization of a peripheral right upper lobe approximately 2 cm ground glass nodule which was present on prior chest CTs concerning for adenocarcinoma. Other small pulmonary nodules described in prior chest CT reports are not radiographically visible. No pleural effusion. No pneumothorax. HEART AND MEDIASTINUM: No acute abnormality of the cardiac and mediastinal silhouettes. BONES AND SOFT TISSUES: No acute osseous abnormality. There is a tangle  of overlying monitor wires. IMPRESSION: 1. Right lower lobe perihilar mass or consolidation, at least mildly improved since January 6. 2. Faint visualization of a 2 cm peripheral right upper lobe ground glass nodule, concerning for adenocarcinoma present on prior chest CTs. Electronically signed by: Francis Quam MD 04/05/2024 06:42 AM EST RP Workstation: [id number]   VAS US  ABI WITH/WO TBI Result Date: 04/03/2024  LOWER EXTREMITY DOPPLER STUDY Patient Name:  Gene Fletcher  Date of Exam:   04/03/2024 Medical Rec #: [id number]            Accession #:    [id number] Date of Birth: [birth year]            Patient Gender: M Patient Age:   84 years Exam Location[facility]  [facility] Procedure:      VAS US  ABI WITH/WO TBI Referring Phys: [name] JEFFERY --------------------------------------------------------------------------------  Indications: Gangrene. High Risk Factors: Hypertension, hyperlipidemia, Diabetes.  Limitations: Today's exam was limited due to an open wound, involuntary patient              movement and Left upper arm IV. Comparison Study: No prior studies.  Performing Technologist: Gerome Ny RVT  Examination Guidelines: A complete evaluation includes at minimum, Doppler waveform signals and systolic blood pressure reading at the level of bilateral brachial, anterior tibial, and posterior tibial arteries, when vessel segments are accessible. Bilateral testing is considered an integral part of a complete examination. Photoelectric Plethysmograph (PPG) waveforms and toe systolic pressure readings are included as required and additional duplex testing as needed. Limited examinations for reoccurring indications may be performed as noted.  ABI Findings: +--------+------------------+-----+-----------+--------+ Right   Rt Pressure (mmHg)IndexWaveform   Comment  +--------+------------------+-----+-----------+--------+ [id number]                    triphasic            +--------+------------------+-----+-----------+--------+ PTA     151               0.98 multiphasic         +--------+------------------+-----+-----------+--------+ DP      86                0.56 monophasic          +--------+------------------+-----+-----------+--------+ +--------+------------------+-----+----------+-------+ Left    Lt Pressure (mmHg)IndexWaveform  Comment +--------+------------------+-----+----------+-------+ Brachial                                 IV      +--------+------------------+-----+----------+-------+ PTA     83                0.54 monophasic        +--------+------------------+-----+----------+-------+ DP                             absent            +--------+------------------+-----+----------+-------+ +-------+-----------+-----------+------------+------------+ ABI/TBIToday's ABIToday's TBIPrevious ABIPrevious TBI +-------+-----------+-----------+------------+------------+ Right  0.98                                           +-------+-----------+-----------+------------+------------+ Left   0.54                                           +-------+-----------+-----------+------------+------------+  Spoke with Dr. Harden who states toe pressures are not necessary.  Summary: Right: Resting right ankle-brachial index is within normal range.  ABI is likely falsely elevated due to medial calcification. 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  *See table(s) above for measurements and observations.  Electronically signed by Lonni Gaskins MD on 04/03/2024 at 8:12:33 PM.    Final    DG Foot 2 Views Right Result Date: 04/03/2024 CLINICAL DATA:  Necrosis. EXAM: RIGHT FOOT - 2 VIEW COMPARISON:  None Available. FINDINGS: Mid and forefoot soft tissue gas. No definite underlying osseous erosion to suggest osteomyelitis. IMPRESSION: Mid and forefoot necrotizing fasciitis. No definitive  evidence of underlying osteomyelitis. Electronically Signed   By: Newell Eke M.D.   On: 04/03/2024 14:31   DG Chest 2 View Result Date: 04/03/2024 CLINICAL DATA:  Infection EXAM: CHEST - 2 VIEW COMPARISON:  October 16, 2023.  March 26, 2024. FINDINGS: The heart size and mediastinal contours are within normal limits. Right midlung nodular density is noted which corresponds to the  nodule described on recent CT scan. Left lung is clear. The visualized skeletal structures are unremarkable. IMPRESSION: Right midlung nodular density is noted which corresponds to the nodule described on recent CT scan. Electronically Signed   By: Lynwood Landy Raddle M.D.   On: 04/03/2024 11:25     Signature  -   Lavada Stank M.D on 04/05/2024 at 8:03 AM   -  To page go to [email or web address]   "

## 2024-04-05 NOTE — [place] FL2 (Cosign Needed Addendum)
 " [place]  MEDICAID [id number] LEVEL OF CARE FORM     IDENTIFICATION  Patient Name: ERMIN PARISIEN Birthdate: [birth year] Sex: male Admission Date (Current Location): 04/03/2024  [facility] and [place] Number:  Producer, Television/film/video and Address:  [facility], [address], [place], [place] [address]      Provider Number: [id number]  Attending Physician Name and Address:  Dennise Lavada POUR, MD  Relative Name and Phone Number:  Daughter, Emergency Contact [phone]    Current Level of Care: Hospital Recommended Level of Care: Skilled Nursing Facility Prior Approval Number:    Date Approved/Denied:   PASRR Number: [id number]  Discharge Plan: SNF    Current Diagnoses: Patient Active Problem List   Diagnosis Date Noted   Gangrene of right foot (HCC) 04/03/2024   No transfusions per religious beliefs [place] Witnesses) 04/03/2024   Diabetic foot infection (HCC) 04/03/2024   DKA (diabetic ketoacidosis) (HCC) 04/03/2024   Malignant neoplasm of lower lobe of right lung (HCC) 11/07/2023   Malignant 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) 10/16/2023   Lung nodules 10/04/2023   Adenopathy 10/04/2023   Insulin  long-term use (HCC) 07/04/2022   Leg edema 07/04/2022   Chronic kidney disease, stage 4 (severe) (HCC) 07/04/2022   Noncompliance with treatment 02/12/2018   Anemia 07/06/2015   Diabetic renal disease (HCC) 12/23/2014   Encounter for general adult medical examination without abnormal findings 12/16/2014   Diabetic retinopathy associated with type 2 diabetes mellitus (HCC) 12/18/2013   Type 2 diabetes mellitus without complications (HCC) 09/24/2009   Hyperlipidemia 09/24/2009   Hyperglycemia due to type 2 diabetes mellitus (HCC) 09/24/2009   Essential hypertension 09/24/2009   Encounter for other general examination 09/24/2009    Orientation RESPIRATION BLADDER Height & Weight     Self, Time, Place  Normal Incontinent Weight: 145 lb 1 oz  (65.8 kg) Height:  5' 10 (177.8 cm)  BEHAVIORAL SYMPTOMS/MOOD NEUROLOGICAL BOWEL NUTRITION STATUS      Continent Diet (see dc summary)  AMBULATORY STATUS COMMUNICATION OF NEEDS Skin   Extensive Assist Verbally Other (Comment) (wound on right anterior foot and diabetic ulcer on right anterior foot); prevena wound vac                       Personal Care Assistance Level of Assistance  Bathing, Feeding, Dressing Bathing Assistance: Maximum assistance Feeding assistance: Limited assistance Dressing Assistance: Limited assistance     Functional Limitations Info             SPECIAL CARE FACTORS FREQUENCY  PT (By licensed PT), OT (By licensed OT)     PT Frequency: 5x/week OT Frequency: 5x/week            Contractures Contractures Info: Not present    Additional Factors Info  Code Status, Allergies, Insulin  Sliding Scale Code Status Info: Full Allergies Info: Flexeril  (cyclobenzaprine )   Insulin  Sliding Scale Info: see d/c summary       Current Medications (04/05/2024):  This is the current hospital active medication list Current Facility-Administered Medications  Medication Dose Route Frequency Provider Last Rate Last Admin   0.9 %  sodium chloride  infusion   Intravenous Continuous Lucious Debby BRAVO, MD   New Bag at 04/05/24 1420   [MAR Hold] amLODipine  (NORVASC ) tablet 10 mg  10 mg Oral Daily Singh, Prashant K, MD   10 mg at 04/05/24 0750   [MAR Hold] aspirin  EC tablet 81 mg  81 mg Oral  Daily Singh, Prashant K, MD       [MAR Hold] atropine  1 % ophthalmic solution 1 drop  1 drop Both Eyes Daily Melvin, Alexander B, MD   1 drop at 04/05/24 0959   [MAR Hold] ceFEPIme  (MAXIPIME ) 2 g in sodium chloride  0.9 % 100 mL IVPB  2 g Intravenous Q24H Gaines Carrier, [facility]   Stopped at 04/04/24 1820   [MAR Hold] chlorproMAZINE  (THORAZINE ) injection 25 mg  25 mg Intramuscular TID PRN Singh, Prashant K, MD   25 mg at 04/05/24 1141   dextrose  5 % solution   Intravenous Continuous Singh,  Prashant K, MD 50 mL/hr at 04/05/24 0848 New Bag at 04/05/24 0848   [MAR Hold] dextrose  50 % solution 0-50 mL  0-50 mL Intravenous PRN Melvin, Alexander B, MD       [MAR Hold] docusate sodium  (COLACE) capsule 200 mg  200 mg Oral BID Singh, Prashant K, MD   200 mg at 04/05/24 0839   [facility] Hold] ferrous sulfate  tablet 325 mg  325 mg Oral BID WC Singh, Prashant K, MD   325 mg at 04/05/24 0839   [MAR Hold] folic acid  (FOLVITE ) tablet 1 mg  1 mg Oral Daily Singh, Prashant K, MD   1 mg at 04/05/24 0959   [MAR Hold] hydrALAZINE  (APRESOLINE ) injection 10 mg  10 mg Intravenous Q6H PRN Singh, Prashant K, MD       [MAR Hold] insulin  aspart (novoLOG ) injection 0-9 Units  0-9 Units Subcutaneous TID WC Kakrakandy, Arshad N, MD   2 Units at 04/05/24 1215   [MAR Hold] insulin  glargine (LANTUS ) injection 15 Units  15 Units Subcutaneous QHS Briana Elgin LABOR, MD   15 Units at 04/04/24 2140   [facility] Hold] isosorbide  mononitrate (IMDUR ) 24 hr tablet 30 mg  30 mg Oral Daily Singh, Prashant K, MD   30 mg at 04/05/24 0750   [MAR Hold] linezolid  (ZYVOX ) tablet 600 mg  600 mg Oral Q12H Briana Elgin LABOR, MD   600 mg at 04/05/24 0243   [MAR Hold] metroNIDAZOLE  (FLAGYL ) IVPB 500 mg  500 mg Intravenous Q12H Melvin, Alexander B, MD 100 mL/hr at 04/05/24 0849 500 mg at 04/05/24 0849   [MAR Hold] ondansetron  (ZOFRAN ) injection 4 mg  4 mg Intravenous Q6H PRN Briana Elgin LABOR, MD   4 mg at 04/04/24 1607   [MAR Hold] pantoprazole  (PROTONIX ) EC tablet 40 mg  40 mg Oral BID Bari Delon BIRCH, RPH   40 mg at 04/05/24 9160   povidone-iodine  10 % swab 2 Application  2 Application Topical Once Gerome Maurilio HERO, PA-C       [MAR Hold] prednisoLONE  acetate (PRED FORTE ) 1 % ophthalmic suspension 1 drop  1 drop Both Eyes QID Seena Marsa NOVAK, MD   1 drop at 04/05/24 0845   Vashe Wound Irrigation Optime    PRN Harden Jerona GAILS, MD   34 oz at 04/05/24 1438   Facility-Administered Medications Ordered in Other Encounters  Medication Dose Route Frequency  Provider Last Rate Last Admin   bupivacaine -epinephrine  (PF) (MARCAINE  W/ EPI) 0.25% -1:200000 injection   Peri-NEURAL Anesthesia Intra-op Lucious Debby BRAVO, MD   25 mL at 04/05/24 1408   lidocaine  2% (20 mg/mL) 5 mL syringe   Intravenous Anesthesia Intra-op Alen Bernardino BIRCH, CRNA   40 mg at 04/05/24 1428   ondansetron  (ZOFRAN ) injection   Intravenous Anesthesia Intra-op Alen Bernardino D, CRNA   4 mg at 04/05/24 1437   PHENYLephrine  80 mcg/ml in normal saline  Adult IV Push Syringe (For Blood Pressure Support)   Intravenous Anesthesia Intra-op Hobbs, Ryan D, CRNA   240 mcg at 04/05/24 1448   propofol  (DIPRIVAN ) 10 mg/mL bolus/IV push   Intravenous Anesthesia Intra-op Hobbs, Ryan D, CRNA   120 mg at 04/05/24 1428     Discharge Medications: Please see discharge summary for a list of discharge medications.  Relevant Imaging Results:  Relevant Lab Results:   Additional Information SSN:[id number]  Inocente GORMAN Kindle, LCSW     "

## 2024-04-05 NOTE — Progress Notes (Signed)
 Patient has been coughing/vomiting up small amounts of dark brown, questionable coffee ground from mouth. On closer assessment of mouth large amounts of white patches noted on tongue, palate, and back of throat. Patient states he feels  a little sick on stomach  MD made aware and requested morning labs be ran as stat- awaiting results from CBC and Bmet. Vitals stable at this time.

## 2024-04-05 NOTE — Anesthesia Procedure Notes (Signed)
 Anesthesia Regional Block: Adductor canal block   Pre-Anesthetic Checklist: , timeout performed,  Correct Patient, Correct Site, Correct Laterality,  Correct Procedure, Correct Position, site marked,  Risks and benefits discussed,  Surgical consent,  Pre-op evaluation,  At surgeon's request and post-op pain management  Laterality: Right  Prep: chloraprep       Needles:  Injection technique: Single-shot  Needle Type: Echogenic Needle     Needle Length: 10cm  Needle Gauge: 21     Additional Needles:   Narrative:  Start time: 04/05/2024 2:02 PM End time: 04/05/2024 2:04 PM Injection made incrementally with aspirations every 5 mL.  Performed by: Personally  Anesthesiologist: Lucious Debby BRAVO, MD  Additional Notes: No pain on injection. No increased resistance to injection. Injection made in 5cc increments. Good needle visualization. Patient tolerated the procedure well.

## 2024-04-05 NOTE — Anesthesia Procedure Notes (Signed)
 Procedure Name: LMA Insertion Date/Time: 04/05/2024 2:30 PM  Performed by: Alen Motto D, CRNAPre-anesthesia Checklist: Patient identified, Emergency Drugs available, Suction available and Patient being monitored Patient Re-evaluated:Patient Re-evaluated prior to induction Oxygen Delivery Method: Circle System Utilized Preoxygenation: Pre-oxygenation with 100% oxygen Induction Type: IV induction Ventilation: Mask ventilation without difficulty LMA: LMA inserted LMA Size: 4.0 Number of attempts: 1 Airway Equipment and Method: Bite block Placement Confirmation: positive ETCO2 Tube secured with: Tape Dental Injury: Teeth and Oropharynx as per pre-operative assessment

## 2024-04-05 NOTE — Anesthesia Procedure Notes (Signed)
 Anesthesia Regional Block: Popliteal block   Pre-Anesthetic Checklist: , timeout performed,  Correct Patient, Correct Site, Correct Laterality,  Correct Procedure, Correct Position, site marked,  Risks and benefits discussed,  Surgical consent,  Pre-op evaluation,  At surgeon's request and post-op pain management  Laterality: Right  Prep: chloraprep       Needles:  Injection technique: Single-shot  Needle Type: Echogenic Needle     Needle Length: 10cm  Needle Gauge: 21     Additional Needles:   Narrative:  Start time: 04/05/2024 2:05 PM End time: 04/05/2024 2:08 PM Injection made incrementally with aspirations every 5 mL.  Performed by: Personally  Anesthesiologist: Lucious Debby BRAVO, MD  Additional Notes: No pain on injection. No increased resistance to injection. Injection made in 5cc increments. Good needle visualization. Patient tolerated the procedure well.

## 2024-04-05 NOTE — Op Note (Signed)
 04/05/2024  3:12 PM  PATIENT:  Gene Fletcher    PRE-OPERATIVE DIAGNOSIS:  Right Foot gangrene  POST-OPERATIVE DIAGNOSIS:  Same  PROCEDURE:  AMPUTATION BELOW KNEE right Application of Kerecis micro graft 38 cm. Application of Prevena Peel and Place wound VAC dressings Application of Vive Wear stump shrinker and the Hanger limb protector  SURGEON:  Jerona LULLA Sage, MD  ANESTHESIA:   General  PREOPERATIVE INDICATIONS:  [name] is a  74 y.o. male with a diagnosis of Right Foot Wound who failed conservative measures and elected for surgical management.    The risks benefits and alternatives were discussed with the patient preoperatively including but not limited to the risks of infection, bleeding, nerve injury, cardiopulmonary complications, the need for revision surgery, among others, and the patient was willing to proceed.  OPERATIVE IMPLANTS:   Implant Name Type Inv. Item Serial No. Manufacturer Lot No. LRB No. Used Action  GRAFT SKIN WND MICRO 38 - [id number] Tissue GRAFT SKIN WND MICRO 38  [employer] [id number] Right 1 Implanted     OPERATIVE FINDINGS: Patient's arteries at the popliteal fossa were completely occluded with cholesterol ,muscles had good color and contractility.  OPERATIVE PROCEDURE: Patient was brought to the operating room after undergoing a regional anesthetic.  After adequate levels anesthesia were obtained a thigh tourniquet was placed and the lower extremity was prepped using DuraPrep draped into a sterile field. The foot was draped out of the sterile field with impervious stockinette.  A timeout was called and the tourniquet inflated.  A transverse skin incision was made 12 cm distal to the tibial tubercle, the incision curved proximally, and a large posterior flap was created.  The tibia was transected just proximal to the skin incision and beveled anteriorly.  The fibula was transected just proximal to the tibial incision.  The sciatic nerve  was pulled cut and allowed to retract.  The vascular bundles were suture ligated with 2-0 silk.  The tourniquet was deflated and hemostasis obtained.  The wound was irrigated with Vashe.   The Kerecis micro powder 38 cm was applied to the open wound that has a 200 cm surface area.    The deep and superficial fascial layers were closed using #1 Vicryl.  The skin was closed using staples.    The Prevena Peel and Place dressing was applied this was overwrapped with Ioban.  This was connected to the wound VAC pump and had a good suction fit, this was covered with a stump shrinker and a limb protector.  Patient was taken to the PACU in stable condition.   DISCHARGE PLANNING:  Antibiotic duration: 24-hour antibiotics  Weightbearing: Nonweightbearing on the operative extremity  Pain medication: Opioid pathway  Dressing care/ Wound VAC: Continue wound VAC with the Prevena plus pump at discharge for 1 week  Ambulatory devices: Walker or kneeling scooter  Discharge to: Discharge planning based on recommendations per physical therapy  Follow-up: In the office 1 week after discharge.

## 2024-04-05 NOTE — Progress Notes (Signed)
 Pt received insulin  within last hour. Dr. Lucious aware and OK to proceed without protocol dose at this time.

## 2024-04-05 NOTE — Progress Notes (Signed)
 Patient complains of continuous hiccups that began this evening, and it is preventing him from getting to sleep. MD on call made aware and new orders received.

## 2024-04-06 DIAGNOSIS — E11628 Type 2 diabetes mellitus with other skin complications: Secondary | ICD-10-CM | POA: Diagnosis not present

## 2024-04-06 DIAGNOSIS — L089 Local infection of the skin and subcutaneous tissue, unspecified: Secondary | ICD-10-CM | POA: Diagnosis not present

## 2024-04-06 LAB — CBC WITH DIFFERENTIAL/PLATELET
Abs Immature Granulocytes: 0.1 K/uL — ABNORMAL HIGH (ref 0.00–0.07)
Basophils Absolute: 0 K/uL (ref 0.0–0.1)
Basophils Relative: 0 %
Eosinophils Absolute: 0 K/uL (ref 0.0–0.5)
Eosinophils Relative: 0 %
HCT: 20.1 % — ABNORMAL LOW (ref 39.0–52.0)
Hemoglobin: 6.6 g/dL — CL (ref 13.0–17.0)
Immature Granulocytes: 1 %
Lymphocytes Relative: 3 %
Lymphs Abs: 0.3 K/uL — ABNORMAL LOW (ref 0.7–4.0)
MCH: 32.2 pg (ref 26.0–34.0)
MCHC: 32.8 g/dL (ref 30.0–36.0)
MCV: 98 fL (ref 80.0–100.0)
Monocytes Absolute: 0.6 K/uL (ref 0.1–1.0)
Monocytes Relative: 6 %
Neutro Abs: 9 K/uL — ABNORMAL HIGH (ref 1.7–7.7)
Neutrophils Relative %: 90 %
Platelets: 202 K/uL (ref 150–400)
RBC: 2.05 MIL/uL — ABNORMAL LOW (ref 4.22–5.81)
RDW: 12.7 % (ref 11.5–15.5)
WBC: 10 K/uL (ref 4.0–10.5)
nRBC: 0 % (ref 0.0–0.2)

## 2024-04-06 LAB — LIPID PANEL
Cholesterol: 108 mg/dL (ref 0–200)
HDL: 35 mg/dL — ABNORMAL LOW
LDL Cholesterol: 62 mg/dL (ref 0–99)
Total CHOL/HDL Ratio: 3.1 ratio
Triglycerides: 59 mg/dL
VLDL: 12 mg/dL (ref 0–40)

## 2024-04-06 LAB — MAGNESIUM: Magnesium: 2.3 mg/dL (ref 1.7–2.4)

## 2024-04-06 LAB — BASIC METABOLIC PANEL WITH GFR
Anion gap: 9 (ref 5–15)
BUN: 52 mg/dL — ABNORMAL HIGH (ref 8–23)
CO2: 23 mmol/L (ref 22–32)
Calcium: 8.1 mg/dL — ABNORMAL LOW (ref 8.9–10.3)
Chloride: 109 mmol/L (ref 98–111)
Creatinine, Ser: 2.27 mg/dL — ABNORMAL HIGH (ref 0.61–1.24)
GFR, Estimated: 30 mL/min — ABNORMAL LOW
Glucose, Bld: 226 mg/dL — ABNORMAL HIGH (ref 70–99)
Potassium: 3.6 mmol/L (ref 3.5–5.1)
Sodium: 140 mmol/L (ref 135–145)

## 2024-04-06 LAB — GLUCOSE, CAPILLARY
Glucose-Capillary: 183 mg/dL — ABNORMAL HIGH (ref 70–99)
Glucose-Capillary: 153 mg/dL — ABNORMAL HIGH (ref 70–99)
Glucose-Capillary: 199 mg/dL — ABNORMAL HIGH (ref 70–99)
Glucose-Capillary: 155 mg/dL — ABNORMAL HIGH (ref 70–99)

## 2024-04-06 MED ORDER — MIDODRINE HCL 5 MG PO TABS
10.0000 mg | ORAL_TABLET | Freq: Three times a day (TID) | ORAL | Status: DC
  Administered 2024-04-06 – 2024-04-15 (×24): 10 mg via ORAL
  Filled 2024-04-06 (×28): qty 2

## 2024-04-06 MED ORDER — NYSTATIN 100000 UNIT/ML MT SUSP
5.0000 mL | Freq: Four times a day (QID) | OROMUCOSAL | Status: DC
  Administered 2024-04-06 – 2024-04-25 (×75): 500000 [IU] via OROMUCOSAL
  Filled 2024-04-06 (×72): qty 5

## 2024-04-06 NOTE — Progress Notes (Signed)
 Patient's brother at bedside requesting to see podiatry and social worker. Family will be here tomorrow during office hours.

## 2024-04-06 NOTE — Progress Notes (Addendum)
 "                                                                                                                                                                                                                                                                                PROGRESS NOTE     Patient Demographics:    Gene Fletcher, is a 74 y.o. male, DOB - 10-25-1950, FMW:[id number]  Outpatient Primary MD for the patient is [name], MD    LOS - 3  Admit date - 04/03/2024    Chief Complaint  Patient presents with   Foot Pain       Brief Narrative (HPI from H&P)   74 y.o. male with a history of hypertension, hyperlipidemia, diabetes mellitus type 2, CKD stage IV, lung cancer s/p radiation, chronic anemia.  Patient presented secondary to a left foot wound with evidence of infection and dry gangrene. Empiric antibiotics started. Orthopedic surgery consulted and plan a left below-knee amputation.    Subjective:   Patient in bed, appears comfortable, denies any headache, no fever, no chest pain or pressure, no shortness of breath , no abdominal pain. No new focal weakness.    Assessment  & Plan :   Diabetic foot infection, wet gangrene of the right foot - Patient with wet gangrene of the second, third, fourth and fifth digits of right foot. Empiric Vancomycin  and Cefepime  started for treatment of associated infection.  Seen by Dr. 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 Harden on 04/05/2024 he has moderate disease but sufficient for post amputation healing per Dr. Harden.  Patient refuses blood transfusion due to personal reasons, will place him on iron  and folic acid , monitor CBC closely minimize blood draws..   DKA patient with DM type II - Likely precipitated by foot gangrene, initially was on insulin  drip, now transition to Lantus  insulin  along with SSI, was taking 7030 at home, diabetic education provided.  Monitor and  adjust.  DKA has resolved.  Gentle D5W drip while NPO.  Lab Results  Component Value Date   HGBA1C 9.2 (H) 04/03/2024   CBG (last 3)  Recent Labs    04/05/24 1518 04/05/24 1606 04/05/24 2132  GLUCAP 164* 162* 211*     Leukocytosis Secondary to foot infection. Anticipate improvement once foot infection has been addressed definitively.   Primary hypertension - blood pressure low due to anemia on 04/06/2024, blood pressure medications held, as needed hydralazine  only, midodrine  added for now.   History of lung cancer with nonspecific right-sided chest x-ray changes.  Follow-up with PCP and primary oncologist postdischarge.   Normocytic anemia anemia with some acute drop due to perioperative blood loss.  Chronic component likely secondary to anemia of chronic disease/kidney disease, as he does not want any blood transfusions placed on iron  and folic acid  preop.  Monitor closely, minimize blood draws.   Memory impairment Concern from family. Recommend outpatient neuropsych testing/evaluation.   Mild hypernatremia, CKD stage IV - Baseline creatinine appears to be around 2.2 to 2.4.  Creatinine close to baseline gentle D5W drip on 04/05/2024.   Moderate bilateral PAD.  Glycemic control, check lipid panel tomorrow, will place on aspirin , statin if needed, outpatient follow-up with vascular surgery.       Condition -   Guarded  Family Communication  :   Brother bedside on 04/05/2024, 04/06/2024  Called daughter Jame on 04/06/2024 at 8 AM and message left on the provided phone number [phone]   Code Status : Full code I  Consults  : Dr. Harden  PUD Prophylaxis : PPI   Procedures  :     ABI.  Moderate bilateral disease discussed with Dr. Harden.      Disposition Plan  :    Status is: Inpatient   DVT Prophylaxis  :    SCD's Start: 04/05/24 1611 SCD's Start: 04/05/24 1611 SCDs Start: 04/03/24 1433    Lab Results  Component Value Date   PLT 202 04/06/2024    Diet  :  Diet Order             Diet Carb Modified Room service appropriate? Yes  Diet effective now                    Inpatient Medications  Scheduled Meds:  acetaminophen   1,000 mg Oral Q6H   acidophilus  1 capsule Oral Daily   amLODipine   10 mg Oral Daily   vitamin C   1,000 mg Oral Daily   aspirin  EC  81 mg Oral Daily   atropine   1 drop Both Eyes Daily   docusate sodium   200 mg Oral BID   ferrous sulfate   325 mg Oral BID WC   folic acid   1 mg Oral Daily   insulin  aspart  0-9 Units Subcutaneous TID WC   insulin  glargine  12 Units Subcutaneous QHS   isosorbide  mononitrate  30 mg Oral Daily   linezolid   600 mg Oral Q12H   nutrition supplement (JUVEN)  1 packet Oral BID BM   pantoprazole   40 mg Oral BID   prednisoLONE  acetate  1 drop Both Eyes QID   tamsulosin   0.4 mg Oral Daily   zinc  sulfate (50mg  elemental zinc )  220 mg Oral Daily   Continuous Infusions:  ceFEPime  (MAXIPIME ) IV 2 g (04/05/24 1723)   metronidazole  500 mg (04/05/24 2013)   PRN Meds:.acetaminophen , chlorproMAZINE  (THORAZINE ) injection, dextrose , hydrALAZINE , HYDROmorphone  (DILAUDID ) injection, labetalol , metoprolol  tartrate, ondansetron  (ZOFRAN ) IV, oxyCODONE , oxyCODONE , phenol, potassium chloride    Objective:   Vitals:   04/05/24 1603 04/05/24 2000 04/06/24 0000 04/06/24 0400  BP: 95/60 (!) 89/59 105/61 (!) 94/58  Pulse: 80 66 66 64  Resp: 17 16 17 17   Temp: 97.8 F (36.6 C) (!) 97.4 F (36.3 C) (!) 97.2 F (36.2 C) (!) 97.4 F (36.3 C)  TempSrc: Oral Oral Oral Oral  SpO2: 99% 99% 98% 98%  Weight:      Height:        Wt Readings from Last 3 Encounters:  04/05/24 65.8 kg  04/02/24 65.8 kg  03/26/24 69.9 kg     Intake/Output Summary (Last 24 hours) at 04/06/2024 0800 Last data filed at 04/05/2024 2200 Gross per 24 hour  Intake 780 ml  Output 1212 ml  Net -432 ml     Physical Exam  Awake Alert, No new F.N deficits, Normal affect [place].AT,PERRAL Supple Neck, No JVD,   Symmetrical  Chest wall movement, Good air movement bilaterally, CTAB RRR,No Gallops,Rubs or new Murmurs,  +ve B.Sounds, Abd Soft, No tenderness,   Right BKA stump looks clean with wound VAC    Data Review:    Recent Labs  Lab 04/03/24 1039 04/03/24 2344 04/04/24 0734 04/05/24 0434 04/06/24 0344  WBC 18.3*  --  21.7* 20.5* 10.0  HGB 9.8* 8.2* 8.5* 8.9* 6.6*  HCT 30.4* 24.0* 26.1* 27.0* 20.1*  PLT 391  --  296 288 202  MCV 99.7  --  98.5 97.5 98.0  MCH 32.1  --  32.1 32.1 32.2  MCHC 32.2  --  32.6 33.0 32.8  RDW 12.3  --  12.6 12.5 12.7  LYMPHSABS 0.3*  --   --   --  0.3*  MONOABS 1.0  --   --   --  0.6  EOSABS 0.0  --   --   --  0.0  BASOSABS 0.0  --   --   --  0.0    Recent Labs  Lab 04/03/24 1039 04/03/24 1046 04/03/24 1205 04/03/24 1434 04/03/24 2344 04/04/24 0045 04/05/24 0434 04/06/24 0344  NA 145  --   --   --  147* 144 146* 140  K 4.3  --   --   --  3.9 3.8 4.0 3.6  CL 105  --   --   --   --  108 110 109  CO2 20*  --   --   --   --  24 25 23   ANIONGAP 20*  --   --   --   --  12 11 9   GLUCOSE 497*  --   --   --   --  204* 202* 226*  BUN 66*  --   --   --   --  59* 47* 52*  CREATININE 2.55*  --   --   --   --  2.36* 2.00* 2.27*  AST 16  --   --   --   --   --   --   --   ALT 24  --   --   --   --   --   --   --   ALKPHOS 149*  --   --   --   --   --   --   --   BILITOT 0.5  --   --   --   --   --   --   --   ALBUMIN 3.2*  --   --   --   --   --   --   --   CRP  --   --   --  21.2*  --   --   --   --   LATICACIDVEN  --  2.3* 1.9  --   --   --   --   --   HGBA1C  --   --   --  9.2*  --   --   --   --   MG  --   --   --   --   --   --   --  2.3  CALCIUM 9.9  --   --   --   --  9.4 9.4 8.1*      Recent Labs  Lab 04/03/24 1039 04/03/24 1046 04/03/24 1205 04/03/24 1434 04/04/24 0045 04/05/24 0434 04/06/24 0344  CRP  --   --   --  21.2*  --   --   --   LATICACIDVEN  --  2.3* 1.9  --   --   --   --   HGBA1C  --   --   --  9.2*  --   --   --   MG  --   --   --    --   --   --  2.3  CALCIUM 9.9  --   --   --  9.4 9.4 8.1*    --------------------------------------------------------------------------------------------------------------- Lab Results  Component Value Date   CHOL 108 04/06/2024   HDL 35 (L) 04/06/2024   LDLCALC 62 04/06/2024   TRIG 59 04/06/2024   CHOLHDL 3.1 04/06/2024    Lab Results  Component Value Date   HGBA1C 9.2 (H) 04/03/2024   No results for input(s): TSH, T4TOTAL, FREET4, T3FREE, THYROIDAB in the last 72 hours. No results for input(s): VITAMINB12, FOLATE, FERRITIN, TIBC, IRON , RETICCTPCT in the last 72 hours. ------------------------------------------------------------------------------------------------------------------ Cardiac Enzymes No results for input(s): CKMB, TROPONINI, MYOGLOBIN in the last 168 hours.  Invalid input(s): CK  Micro Results Recent Results (from the past 240 hours)  Surgical pcr screen     Status: None   Collection Time: 04/04/24 10:39 AM   Specimen: Nasal Mucosa; Nasal Swab  Result Value Ref Range Status   MRSA, PCR NEGATIVE NEGATIVE Final   Staphylococcus aureus NEGATIVE NEGATIVE Final    Comment: (NOTE) The Xpert SA Assay (FDA approved for NASAL specimens in patients 26 years of age and older), is one component of a comprehensive surveillance program. It is not intended to diagnose infection nor to guide or monitor treatment. Performed at [facility], [address], [place], [place] [address]     Radiology Report DG Abd Portable 1V Result Date: 04/05/2024 EXAM: 1 VIEW XRAY OF THE ABDOMEN 04/05/2024 06:23:00 AM COMPARISON: Abdomen and pelvis CT 03/26/2024. CLINICAL HISTORY: Nausea \\T \ vomiting. FINDINGS: BOWEL: There is moderate stool retention. No dilated small bowel. Nonobstructive bowel gas pattern. SOFT TISSUES: No abnormal calcifications. BONES: Degenerative changes in the lumbar spine. Ankylosis of the upper left sacroiliac joint. No acute  fracture. IMPRESSION: 1. No evidence of bowel obstruction. No supine evidence of free air. 2. Moderate colonic stool burden. Electronically signed by: Francis Quam MD 04/05/2024 06:45 AM EST RP Workstation: [id number]   DG Chest Port 1 View Result Date: 04/05/2024 EXAM: 1 VIEW(S) XRAY OF THE CHEST 04/05/2024 06:23:00 AM COMPARISON: Chest CT 03/26/2024, chest CT 09/14/2023, AP and lateral chest. CLINICAL HISTORY: SOB (shortness of breath). FINDINGS: LUNGS AND PLEURA: Right lower lobe perihilar mass or consolidation is again noted and appears at least mildly improved since 03/26/2024. There is faint visualization of  a peripheral right upper lobe approximately 2 cm ground glass nodule which was present on prior chest CTs concerning for adenocarcinoma. Other small pulmonary nodules described in prior chest CT reports are not radiographically visible. No pleural effusion. No pneumothorax. HEART AND MEDIASTINUM: No acute abnormality of the cardiac and mediastinal silhouettes. BONES AND SOFT TISSUES: No acute osseous abnormality. There is a tangle of overlying monitor wires. IMPRESSION: 1. Right lower lobe perihilar mass or consolidation, at least mildly improved since January 6. 2. Faint visualization of a 2 cm peripheral right upper lobe ground glass nodule, concerning for adenocarcinoma present on prior chest CTs. Electronically signed by: Francis Quam MD 04/05/2024 06:42 AM EST RP Workstation: [id number]     Signature  -   Lavada Stank M.D on 04/06/2024 at 8:00 AM   -  To page go to [email or web address]   "

## 2024-04-06 NOTE — Plan of Care (Signed)

## 2024-04-06 NOTE — Progress Notes (Signed)
 Received a call from lab, critical result of Hgb 6.6. Paged Dr. Charlton. Pt refuses blood due to religious restrictions. Advised by Dr. Charlton to inform dayshift nurse to inform attending physician during the day as long as the patient is stable.

## 2024-04-06 NOTE — Plan of Care (Signed)

## 2024-04-06 NOTE — TOC Progression Note (Signed)
 Transition of Care [facility]) - Progression Note    Patient Details  Name: Gene Fletcher MRN: [id number] Date of Birth: 08-Jun-1950  [facility] of [facility] CM/SW Contact  Bridget Cordella Simmonds, LCSW Phone Number: 04/06/2024, 1:23 PM  Clinical Narrative:   [place] down as offering bed, CSW checked with Isaiah: she is still checking if their [place] building can offer.     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, SNF Pending bed offer               Expected Discharge Plan and Services In-house Referral: Clinical Social Work   Post Acute Care Choice: Skilled Nursing Facility Living arrangements for the past 2 months: Single Family Home                                       Social Drivers of Health (SDOH) Interventions SDOH Screenings   Food Insecurity: No Food Insecurity (04/03/2024)  Housing: Low Risk (04/03/2024)  Transportation Needs: No Transportation Needs (04/03/2024)  Utilities: Not At Risk (04/03/2024)  Depression (PHQ2-9): Low Risk (11/07/2023)  Social Connections: Socially Integrated (04/03/2024)  Tobacco Use: Low Risk (04/05/2024)    Readmission Risk Interventions     No data to display

## 2024-04-06 NOTE — Evaluation (Signed)
 Occupational Therapy Evaluation Patient Details Name: Gene Fletcher MRN: [id number] DOB: July 07, 1950 Today's Date: 04/06/2024   History of Present Illness   74 y.o male presents to [facility] on 04/03/24 with R foot ulcer for 6 months. 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 PMH: HLD, HTN, DMII, CKD, lung CA, anemia.     Clinical Impressions PTA Pt reports that he was independent with functional mobility and ADLs. Pt unreliable historian, brother present for session endorses significant history of falls at home as Pt spouse has dementia and cannot provide assistance. Pt currently requires Mod A +2 to transfer to EOB. Up to Max A required for ADL engagement. Pt primarily limited by generalized weakness, decreased knowledge of DME/AE, abnormality of balance, and decreased activity tolerance. OT to continue to follow Pt acutely to facilitate progress towards goals. Patient will benefit from continued inpatient follow up therapy, <3 hours/day.      If plan is discharge home, recommend the following:   Two people to help with walking and/or transfers;A lot of help with bathing/dressing/bathroom;Assistance with cooking/housework;Assistance with feeding;Direct supervision/assist for medications management;Assist for transportation;Help with stairs or ramp for entrance     Functional Status Assessment   Patient has had a recent decline in their functional status and demonstrates the ability to make significant improvements in function in a reasonable and predictable amount of time.     Equipment Recommendations   Other (comment) (defer)     Recommendations for Other Services         Precautions/Restrictions   Precautions Precautions: Fall Recall of Precautions/Restrictions: Impaired Precaution/Restrictions Comments: Wound vac Restrictions Weight Bearing Restrictions Per Provider Order: Yes RLE Weight Bearing Per Provider Order: Non weight bearing     Mobility Bed Mobility Overal bed mobility:  Needs Assistance Bed Mobility: Supine to Sit, Sit to Supine     Supine to sit: Mod assist, +2 for physical assistance, +2 for safety/equipment, HOB elevated Sit to supine: Max assist, +2 for physical assistance, +2 for safety/equipment   General bed mobility comments: Mod A +2 to helicopter to EOB. Pt with minimal initiation of BLE movement towards HOB. Increased assistance required to return to supine and reposition in bed.    Transfers                   General transfer comment: Pt declined further transfer this session.      Balance Overall balance assessment: Needs assistance Sitting-balance support: Single extremity supported, Feet supported Sitting balance-Leahy Scale: Fair Sitting balance - Comments: close supervision for safety                                   ADL either performed or assessed with clinical judgement   ADL Overall ADL's : Needs assistance/impaired Eating/Feeding: Set up;Sitting   Grooming: Minimal assistance;Sitting   Upper Body Bathing: Moderate assistance   Lower Body Bathing: Maximal assistance   Upper Body Dressing : Minimal assistance   Lower Body Dressing: Maximal assistance     Toilet Transfer Details (indicate cue type and reason): Unable to transfer OOB this date, would require +2 for safety Toileting- Clothing Manipulation and Hygiene: Total assistance               Vision   Additional Comments: Difficult to assess vision. Pt with eyes closed throughout session, required increased encouragement and direction to open eyes     Perception  Praxis         Pertinent Vitals/Pain Pain Assessment Pain Assessment: No/denies pain     Extremity/Trunk Assessment Upper Extremity Assessment Upper Extremity Assessment: Generalized weakness;RUE deficits/detail RUE Deficits / Details: RUE held in flexor synergy pattern. Increased bicep tone with movement limiting ROM at elbow. PROM of digits WFL. RUE  Coordination: decreased fine motor;decreased gross motor   Lower Extremity Assessment Lower Extremity Assessment: Defer to PT evaluation       Communication Communication Communication: No apparent difficulties   Cognition Arousal: Lethargic Behavior During Therapy: Flat affect Cognition: Difficult to assess Difficult to assess due to: Level of arousal           OT - Cognition Comments: Oriented to self and place, not oriented to time. Pt lethargic and decreased engagement in session, requiring increased motivation to open eyes and participate in tasks. Decreased reasoning and problem solving.                 Following commands: Impaired Following commands impaired: Follows one step commands with increased time, Follows multi-step commands inconsistently     Cueing  General Comments   Cueing Techniques: Verbal cues;Visual cues;Tactile cues  VSS on RA   Exercises     Shoulder Instructions      Home Living Family/patient expects to be discharged to:: Private residence Living Arrangements: Spouse/significant other Available Help at Discharge: Family;Friend(s) Type of Home: House Home Access: Stairs to enter [employer] of Steps: 3   Home Layout: One level     Bathroom Shower/Tub: Tub/shower unit         Home Equipment: Grab bars - tub/shower;Grab bars - toilet;Shower seat   Additional Comments: Pt poor historian. Brother present for session informed OT/PT that Pt wife has dementia and he does not have adequate 24/7 assistance.      Prior Functioning/Environment               Mobility Comments: Pt reports he was not using AD ADLs Comments: Pt reports independent    OT Problem List: Decreased strength;Decreased activity tolerance;Impaired balance (sitting and/or standing);Decreased safety awareness;Decreased knowledge of use of DME or AE;Decreased knowledge of precautions;Impaired UE functional use;Pain   OT Treatment/Interventions:  Self-care/ADL training;Therapeutic exercise;Energy conservation;DME and/or AE instruction;Therapeutic activities;Patient/family education;Balance training      OT Goals(Current goals can be found in the care plan section)   Acute Rehab OT Goals Patient Stated Goal: to sleep OT Goal Formulation: With patient Time For Goal Achievement: 04/20/24 Potential to Achieve Goals: Good ADL Goals Pt Will Perform Grooming: with modified independence;sitting Pt Will Perform Upper Body Dressing: with set-up;sitting Pt Will Perform Lower Body Dressing: with set-up;sitting/lateral leans Pt/caregiver will Perform Home Exercise Program: Increased ROM;Right Upper extremity (PROM) Additional ADL Goal #1: Pt will engage in bed mobility with Supervision as a precursor to ADL tasks OOB   OT Frequency:  Min 2X/week    Co-evaluation PT/OT/SLP Co-Evaluation/Treatment: Yes Reason for Co-Treatment: Complexity of the patient's impairments (multi-system involvement);For patient/therapist safety;To address functional/ADL transfers   OT goals addressed during session: Strengthening/ROM;ADL's and self-care      AM-PAC OT 6 Clicks Daily Activity     Outcome Measure Help from another person eating meals?: A Little Help from another person taking care of personal grooming?: A Little Help from another person toileting, which includes using toliet, bedpan, or urinal?: Total Help from another person bathing (including washing, rinsing, drying)?: A Lot Help from another person to put on and taking off regular upper  body clothing?: A Little Help from another person to put on and taking off regular lower body clothing?: A Lot 6 Click Score: 14   End of Session    Activity Tolerance: Patient limited by fatigue Patient left: in bed;with call bell/phone within reach;with bed alarm set;with family/visitor present  OT Visit Diagnosis: Other abnormalities of gait and mobility (R26.89);Repeated falls (R29.6);Muscle  weakness (generalized) (M62.81);History of falling (Z91.81);Pain                Time: 8866-8844 OT Time Calculation (min): 22 min Charges:  OT General Charges $OT Visit: 1 Visit OT Evaluation $OT Eval Low Complexity: 1 Low  Maurilio CROME, OTR/L.  [facility] Acute [facility]  Office: [phone]   Maurilio PARAS [place] Stegemann 04/06/2024, 12:44 PM

## 2024-04-06 NOTE — Evaluation (Signed)
 Physical Therapy Evaluation Patient Details Name: Gene Fletcher MRN: [id number] DOB: 11/06/1950 Today's Date: 04/06/2024  History of Present Illness  74 y.o male presents to [facility] on 04/03/24 with R foot ulcer for 6 months. 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 PMH: HLD, HTN, DMII, CKD, lung CA, anemia.  Clinical Impression  Pt is currently mobilizing below his baseline due to weakness, confusion, and being s/p R BKA. Pt with 6.6 Hgb at this time and unable to take blood products, limiting mobility. Pt lethargic throughout and requiring mod-maxA for bed mobility due to difficulty following cues to mobilize towards EOB. Pt tolerates sitting EOB ~5 minutes and declines further mobility due to interrupting my nap time.   Pt denies dizziness upon sitting EOB. Pt would benefit from continued PT services focused on bed mobility, strength, and transfers to progress functional mobility. Per discussion with pt's brother, pt has had difficulty managing at home and family will likely be looking into transition to ALF vs LTC. <3 hrs rehab appropriate upon discharge at this time to progress mobility until that transition can be made.       If plan is discharge home, recommend the following: Two people to help with walking and/or transfers;Two people to help with bathing/dressing/bathroom;Assistance with cooking/housework;Direct supervision/assist for medications management;Direct supervision/assist for financial management;Assist for transportation;Help with stairs or ramp for entrance;Supervision due to cognitive status   Can travel by private vehicle   No (Wound vac and cognition)    Equipment Recommendations Rolling walker (2 wheels);BSC/3in1;Wheelchair (measurements PT);Wheelchair cushion (measurements PT)  Recommendations for Other Services       Functional Status Assessment Patient has had a recent decline in their functional status and demonstrates the ability to make significant improvements in function in a  reasonable and predictable amount of time.     Precautions / Restrictions Precautions Precautions: Fall Recall of Precautions/Restrictions: Impaired Precaution/Restrictions Comments: Wound vac Restrictions Weight Bearing Restrictions Per Provider Order: Yes RLE Weight Bearing Per Provider Order: Non weight bearing      Mobility  Bed Mobility Overal bed mobility: Needs Assistance Bed Mobility: Supine to Sit, Sit to Supine     Supine to sit: Mod assist, +2 for physical assistance, +2 for safety/equipment, HOB elevated Sit to supine: Max assist, +2 for physical assistance, +2 for safety/equipment   General bed mobility comments: Mod A +2 to pivot and sit up EOB. Pt requires repeated cues to show initiation of BLE movement towards HOB. Increased assistance required to return to supine and reposition in bed.    Transfers                   General transfer comment: Pt declining STS transfer at this time.    Ambulation/Gait                  Stairs            Wheelchair Mobility     Tilt Bed    Modified Rankin (Stroke Patients Only)       Balance Overall balance assessment: Needs assistance Sitting-balance support: Single extremity supported, Feet supported Sitting balance-Leahy Scale: Fair Sitting balance - Comments: CGA due to lethargy and confusion sitting EOB.       Standing balance comment: Pt declines standing this session.                             Pertinent Vitals/Pain Pain Assessment Pain Assessment: No/denies pain  Home Living Family/patient expects to be discharged to:: Private residence Living Arrangements: Spouse/significant other Available Help at Discharge: Family;Friend(s) Type of Home: House Home Access: Stairs to enter   [employer] of Steps: 3   Home Layout: One level Home Equipment: Grab bars - tub/shower;Grab bars - toilet;Shower seat Additional Comments: Pt poor historian. Brother  present for session informed OT/PT that Pt wife has dementia and he does not have adequate 24/7 assistance. Brother reports coming to visit and finding pt lying on the bathroom floor or in the backyard.    Prior Function Prior Level of Function : Independent/Modified Independent             Mobility Comments: Pt reports he was not using AD, Brother reports multiple recent falls. ADLs Comments: Pt reports independent     Extremity/Trunk Assessment   Upper Extremity Assessment Upper Extremity Assessment: Defer to OT evaluation    Lower Extremity Assessment Lower Extremity Assessment: RLE deficits/detail;LLE deficits/detail RLE Deficits / Details: s/p BKA, wound vac in place. Pt not following cues to extend the knee but is able to lift leg off the bed against gravity. RLE Sensation: WNL LLE Deficits / Details: Generalized weakness and difficulty following cues but appears to be [facility]. LLE Sensation: WNL    Cervical / Trunk Assessment Cervical / Trunk Assessment: Kyphotic  Communication   Communication Communication: No apparent difficulties    Cognition Arousal: Lethargic Behavior During Therapy: Flat affect   PT - Cognitive impairments: Memory, Attention, Awareness, Safety/Judgement, Problem solving, Sequencing, Initiation                       PT - Cognition Comments: Pt able to answer orientation questions correctly but appears confused throughout. Speech slow and slurred. Following commands: Impaired Following commands impaired: Follows one step commands with increased time, Follows multi-step commands inconsistently     Cueing Cueing Techniques: Verbal cues, Visual cues, Tactile cues     General Comments General comments (skin integrity, edema, etc.): VSS throughout. BKA incision C,D,I. Wound vac in place. No additional skin abnormalities noted.    Exercises     Assessment/Plan    PT Assessment Patient needs continued PT services  PT Problem List  Decreased strength;Decreased mobility;Decreased safety awareness;Decreased range of motion;Decreased coordination;Decreased knowledge of precautions;Decreased activity tolerance;Decreased cognition;Decreased skin integrity;Decreased balance;Decreased knowledge of use of DME;Impaired sensation       PT Treatment Interventions DME instruction;Therapeutic exercise;Wheelchair mobility training;Gait training;Balance training;Neuromuscular re-education;Functional mobility training;Cognitive remediation;Therapeutic activities;Patient/family education    PT Goals (Current goals can be found in the Care Plan section)  Acute Rehab PT Goals Patient Stated Goal: None stated    Frequency Min 2X/week     Co-evaluation PT/OT/SLP Co-Evaluation/Treatment: Yes Reason for Co-Treatment: Complexity of the patient's impairments (multi-system involvement);For patient/therapist safety;To address functional/ADL transfers PT goals addressed during session: Mobility/safety with mobility;Balance OT goals addressed during session: Strengthening/ROM;ADL's and self-care       AM-PAC PT 6 Clicks Mobility  Outcome Measure Help needed turning from your back to your side while in a flat bed without using bedrails?: A Little Help needed moving from lying on your back to sitting on the side of a flat bed without using bedrails?: A Lot Help needed moving to and from a bed to a chair (including a wheelchair)?: Total Help needed standing up from a chair using your arms (e.g., wheelchair or bedside chair)?: Total Help needed to walk in hospital room?: Total Help needed climbing 3-5 steps with a  railing? : Total 6 Click Score: 9    End of Session   Activity Tolerance: Patient limited by fatigue;Patient limited by lethargy Patient left: in bed;with call bell/phone within reach;with bed alarm set;with family/visitor present Nurse Communication: Mobility status PT Visit Diagnosis: Unsteadiness on feet (R26.81);Muscle  weakness (generalized) (M62.81);Other abnormalities of gait and mobility (R26.89)    Time: 8866-8843 PT Time Calculation (min) (ACUTE ONLY): 23 min   Charges:   PT Evaluation $PT Eval High Complexity: 1 High   PT General Charges $$ ACUTE PT VISIT: 1 Visit         Sabra Morel, PT, DPT  Acute Rehabilitation Services         Office: [phone]     Sabra MARLA Morel 04/06/2024, 3:39 PM

## 2024-04-06 NOTE — Progress Notes (Signed)
 Patient ID: Gene Fletcher, male   DOB: 24-Oct-1950, 74 y.o.   MRN: [id number] Patient is postoperative day 1 transtibial amputation.  LRINEC score was 10 preoperatively.  There is no drainage in the wound VAC canister.  Anticipate patient will need discharge to skilled nursing.

## 2024-04-07 DIAGNOSIS — E11628 Type 2 diabetes mellitus with other skin complications: Secondary | ICD-10-CM | POA: Diagnosis not present

## 2024-04-07 DIAGNOSIS — L089 Local infection of the skin and subcutaneous tissue, unspecified: Secondary | ICD-10-CM | POA: Diagnosis not present

## 2024-04-07 LAB — GLUCOSE, CAPILLARY
Glucose-Capillary: 98 mg/dL (ref 70–99)
Glucose-Capillary: 130 mg/dL — ABNORMAL HIGH (ref 70–99)
Glucose-Capillary: 149 mg/dL — ABNORMAL HIGH (ref 70–99)
Glucose-Capillary: 172 mg/dL — ABNORMAL HIGH (ref 70–99)

## 2024-04-07 LAB — BASIC METABOLIC PANEL WITH GFR
Anion gap: 9 (ref 5–15)
BUN: 60 mg/dL — ABNORMAL HIGH (ref 8–23)
CO2: 24 mmol/L (ref 22–32)
Calcium: 8.3 mg/dL — ABNORMAL LOW (ref 8.9–10.3)
Chloride: 108 mmol/L (ref 98–111)
Creatinine, Ser: 2.45 mg/dL — ABNORMAL HIGH (ref 0.61–1.24)
GFR, Estimated: 27 mL/min — ABNORMAL LOW
Glucose, Bld: 107 mg/dL — ABNORMAL HIGH (ref 70–99)
Potassium: 3.4 mmol/L — ABNORMAL LOW (ref 3.5–5.1)
Sodium: 141 mmol/L (ref 135–145)

## 2024-04-07 LAB — CBC WITH DIFFERENTIAL/PLATELET
Abs Immature Granulocytes: 0.15 K/uL — ABNORMAL HIGH (ref 0.00–0.07)
Basophils Absolute: 0 K/uL (ref 0.0–0.1)
Basophils Relative: 0 %
Eosinophils Absolute: 0 K/uL (ref 0.0–0.5)
Eosinophils Relative: 0 %
HCT: 22.1 % — ABNORMAL LOW (ref 39.0–52.0)
Hemoglobin: 7.2 g/dL — ABNORMAL LOW (ref 13.0–17.0)
Immature Granulocytes: 2 %
Lymphocytes Relative: 5 %
Lymphs Abs: 0.5 K/uL — ABNORMAL LOW (ref 0.7–4.0)
MCH: 32.1 pg (ref 26.0–34.0)
MCHC: 32.6 g/dL (ref 30.0–36.0)
MCV: 98.7 fL (ref 80.0–100.0)
Monocytes Absolute: 0.6 K/uL (ref 0.1–1.0)
Monocytes Relative: 6 %
Neutro Abs: 8.5 K/uL — ABNORMAL HIGH (ref 1.7–7.7)
Neutrophils Relative %: 87 %
Platelets: 194 K/uL (ref 150–400)
RBC: 2.24 MIL/uL — ABNORMAL LOW (ref 4.22–5.81)
RDW: 12.9 % (ref 11.5–15.5)
WBC: 9.8 K/uL (ref 4.0–10.5)
nRBC: 0 % (ref 0.0–0.2)

## 2024-04-07 LAB — MAGNESIUM: Magnesium: 2.2 mg/dL (ref 1.7–2.4)

## 2024-04-07 MED ORDER — HYDROMORPHONE HCL 1 MG/ML IJ SOLN: 0.5000 mg | INTRAMUSCULAR | Status: DC | PRN

## 2024-04-07 MED ORDER — POTASSIUM CHLORIDE CRYS ER 20 MEQ PO TBCR
40.0000 meq | EXTENDED_RELEASE_TABLET | Freq: Once | ORAL | Status: DC
  Filled 2024-04-07: qty 2

## 2024-04-07 MED ORDER — OXYCODONE HCL 5 MG PO TABS: 5.0000 mg | ORAL_TABLET | ORAL | Status: DC | PRN

## 2024-04-07 MED ORDER — POTASSIUM CHLORIDE 20 MEQ PO PACK
40.0000 meq | PACK | Freq: Once | ORAL | Status: AC
  Administered 2024-04-07: 40 meq via ORAL
  Filled 2024-04-07: qty 2

## 2024-04-07 MED ORDER — OXYCODONE HCL 5 MG PO TABS
5.0000 mg | ORAL_TABLET | Freq: Four times a day (QID) | ORAL | Status: AC | PRN
  Administered 2024-04-17: 5 mg via ORAL
  Filled 2024-04-07: qty 1

## 2024-04-07 NOTE — Plan of Care (Signed)
 " Problem: Education: Goal: Ability to describe self-care measures that may prevent or decrease complications (Diabetes Survival Skills Education) will improve Outcome: Progressing Goal: Individualized Educational Video(s) Outcome: Progressing   Problem: Coping: Goal: Ability to adjust to condition or change in health will improve Outcome: Progressing   Problem: Fluid Volume: Goal: Ability to maintain a balanced intake and output will improve Outcome: Progressing   Problem: Health Behavior/Discharge Planning: Goal: Ability to identify and utilize available resources and services will improve Outcome: Progressing Goal: Ability to manage health-related needs will improve Outcome: Progressing   Problem: Metabolic: Goal: Ability to maintain appropriate glucose levels will improve Outcome: Progressing   Problem: Nutritional: Goal: Maintenance of adequate nutrition will improve Outcome: Progressing Goal: Progress toward achieving an optimal weight will improve Outcome: Progressing   Problem: Skin Integrity: Goal: Risk for impaired skin integrity will decrease Outcome: Progressing   Problem: Tissue Perfusion: Goal: Adequacy of tissue perfusion will improve Outcome: Progressing   Problem: Education: Goal: Ability to describe self-care measures that may prevent or decrease complications (Diabetes Survival Skills Education) will improve Outcome: Progressing Goal: Individualized Educational Video(s) Outcome: Progressing   Problem: Cardiac: Goal: Ability to maintain an adequate cardiac output will improve Outcome: Progressing   Problem: Health Behavior/Discharge Planning: Goal: Ability to identify and utilize available resources and services will improve Outcome: Progressing Goal: Ability to manage health-related needs will improve Outcome: Progressing   Problem: Fluid Volume: Goal: Ability to achieve a balanced intake and output will improve Outcome: Progressing    Problem: Metabolic: Goal: Ability to maintain appropriate glucose levels will improve Outcome: Progressing   Problem: Nutritional: Goal: Maintenance of adequate nutrition will improve Outcome: Progressing Goal: Maintenance of adequate weight for body size and type will improve Outcome: Progressing   Problem: Respiratory: Goal: Will regain and/or maintain adequate ventilation Outcome: Progressing   Problem: Urinary Elimination: Goal: Ability to achieve and maintain adequate renal perfusion and functioning will improve Outcome: Progressing   Problem: Education: Goal: Knowledge of General Education information will improve Description: Including pain rating scale, medication(s)/side effects and non-pharmacologic comfort measures Outcome: Progressing   Problem: Health Behavior/Discharge Planning: Goal: Ability to manage health-related needs will improve Outcome: Progressing   Problem: Clinical Measurements: Goal: Ability to maintain clinical measurements within normal limits will improve Outcome: Progressing Goal: Will remain free from infection Outcome: Progressing Goal: Diagnostic test results will improve Outcome: Progressing Goal: Respiratory complications will improve Outcome: Progressing Goal: Cardiovascular complication will be avoided Outcome: Progressing   Problem: Activity: Goal: Risk for activity intolerance will decrease Outcome: Progressing   Problem: Coping: Goal: Level of anxiety will decrease Outcome: Progressing   Problem: Pain Managment: Goal: General experience of comfort will improve and/or be controlled Outcome: Progressing   Problem: Safety: Goal: Ability to remain free from injury will improve Outcome: Progressing   Problem: Skin Integrity: Goal: Risk for impaired skin integrity will decrease Outcome: Progressing   Problem: Education: Goal: Knowledge of the prescribed therapeutic regimen will improve Outcome: Progressing   Problem:  Bowel/Gastric: Goal: Gastrointestinal status for postoperative course will improve Outcome: Progressing   Problem: Cardiac: Goal: Ability to maintain an adequate cardiac output Outcome: Progressing Goal: Will show no evidence of cardiac arrhythmias Outcome: Progressing   Problem: Nutritional: Goal: Will attain and maintain optimal nutritional status Outcome: Progressing   Problem: Neurological: Goal: Will regain or maintain usual level of consciousness Outcome: Progressing   Problem: Clinical Measurements: Goal: Ability to maintain clinical measurements within normal limits Outcome: Progressing Goal: Postoperative complications will be avoided  or minimized Outcome: Progressing   "

## 2024-04-07 NOTE — Progress Notes (Signed)
 Did bladder scan . Dr. Charlton made aware. Did in and out cath as ordered. Drained output. Post residual volume through bladder scan is 0 ml.

## 2024-04-07 NOTE — Progress Notes (Signed)
 "                                                                                                                                                                                                                                                                                PROGRESS NOTE     Patient Demographics:    Gene Fletcher, is a 74 y.o. male, DOB - 02-04-1951, FMW:[id number]  Outpatient Primary MD for the patient is [name], MD    LOS - 4  Admit date - 04/03/2024    Chief Complaint  Patient presents with   Foot Pain       Brief Narrative (HPI from H&P)   74 y.o. male with a history of hypertension, hyperlipidemia, diabetes mellitus type 2, CKD stage IV, lung cancer s/p radiation, chronic anemia.  Patient presented secondary to a left foot wound with evidence of infection and dry gangrene. Empiric antibiotics started. Orthopedic surgery consulted and plan a left below-knee amputation.    Subjective:   Patient in bed, appears comfortable, denies any headache, no fever, no chest pain or pressure, no shortness of breath , no abdominal pain. No new focal weakness.   Assessment  & Plan :   Diabetic foot infection, wet gangrene of the right foot - Patient with wet gangrene of the second, third, fourth and fifth digits of right foot. Empiric Vancomycin  and Cefepime  started for treatment of associated infection.  Seen by Dr. 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 Harden on 04/05/2024 he has moderate disease but sufficient for post amputation healing per Dr. Harden.  Patient refuses blood transfusion due to personal reasons, will place him on iron  and folic acid , monitor CBC closely minimize blood draws..   DKA patient with DM type II - Likely precipitated by foot gangrene, initially was on insulin  drip, now transition to Lantus  insulin  along with SSI, was taking 7030 at home, diabetic education provided.  Monitor and adjust.   DKA has resolved.    Lab Results  Component Value Date   HGBA1C 9.2 (H) 04/03/2024   CBG (last 3)  Recent Labs  04/06/24 1208 04/06/24 1726 04/06/24 2107  GLUCAP 153* 199* 155*     Leukocytosis  Secondary to foot infection, resolved completely   Primary hypertension - blood pressure low due to anemia on 04/06/2024, blood pressure medications held, as needed hydralazine  only, midodrine  added for now.   History of lung cancer with nonspecific right-sided chest x-ray changes.  Follow-up with PCP and primary oncologist postdischarge.   Normocytic anemia anemia with some acute drop due to perioperative blood loss.  Chronic component likely secondary to anemia of chronic disease/kidney disease, as he does not want any blood transfusions placed on iron  and folic acid  preop.  Monitor closely, minimize blood draws.   Memory impairment Concern from family. Recommend outpatient neuropsych testing/evaluation.   Mild hypernatremia, CKD stage IV, hypokalemia- Baseline creatinine appears to be around 2.2 to 2.4.  Potassium replaced.  Renal function stable to improving.   Moderate bilateral PAD.  Glycemic control, check lipid panel tomorrow, will place on aspirin , statin if needed, outpatient follow-up with vascular surgery.  Urinary retention.  On Flomax , In-N-Out catheter, if retention becomes recurrent then Foley.       Condition -   Guarded  Family Communication  :   Brother bedside on 04/05/2024, 04/06/2024  Called daughter Jame on 04/06/2024 at 8 AM and message left on the provided phone number [phone]   Code Status : Full code I  Consults  : Dr. Harden  PUD Prophylaxis : PPI   Procedures  :     ABI.  Moderate bilateral disease discussed with Dr. Harden.      Disposition Plan  :    Status is: Inpatient   DVT Prophylaxis  :    SCD's Start: 04/05/24 1611 SCD's Start: 04/05/24 1611 SCDs Start: 04/03/24 1433    Lab Results  Component Value Date   PLT 194  04/07/2024    Diet :  Diet Order             Diet Carb Modified Room service appropriate? No  Diet effective now                    Inpatient Medications  Scheduled Meds:  acidophilus  1 capsule Oral Daily   vitamin C   1,000 mg Oral Daily   aspirin  EC  81 mg Oral Daily   atropine   1 drop Both Eyes Daily   docusate sodium   200 mg Oral BID   ferrous sulfate   325 mg Oral BID WC   folic acid   1 mg Oral Daily   insulin  aspart  0-9 Units Subcutaneous TID WC   insulin  glargine  12 Units Subcutaneous QHS   linezolid   600 mg Oral Q12H   midodrine   10 mg Oral TID WC   nutrition supplement (JUVEN)  1 packet Oral BID BM   nystatin   5 mL Mouth/Throat QID   pantoprazole   40 mg Oral BID   potassium chloride   40 mEq Oral Once   prednisoLONE  acetate  1 drop Both Eyes QID   tamsulosin   0.4 mg Oral Daily   zinc  sulfate (50mg  elemental zinc )  220 mg Oral Daily   Continuous Infusions:  ceFEPime  (MAXIPIME ) IV 2 g (04/06/24 1750)   metronidazole  500 mg (04/06/24 1948)   PRN Meds:.acetaminophen , chlorproMAZINE  (THORAZINE ) injection, dextrose , hydrALAZINE , HYDROmorphone  (DILAUDID ) injection, labetalol , ondansetron  (ZOFRAN ) IV, oxyCODONE , phenol   Objective:   Vitals:   04/06/24 1923 04/07/24 0000 04/07/24 0400 04/07/24 0700  BP: 118/66 119/65 124/66   Pulse: 88 88  84   Resp: 20 18 18 11   Temp: 98.5 F (36.9 C) 98.7 F (37.1 C) 98.6 F (37 C)   TempSrc: Oral Oral Oral   SpO2: 98% 98% 98%   Weight:      Height:        Wt Readings from Last 3 Encounters:  04/05/24 65.8 kg  04/02/24 65.8 kg  03/26/24 69.9 kg     Intake/Output Summary (Last 24 hours) at 04/07/2024 0752 Last data filed at 04/07/2024 0616 Gross per 24 hour  Intake 480 ml  Output 1350 ml  Net -870 ml     Physical Exam  Awake Alert, No new F.N deficits, Normal affect [place].AT,PERRAL Supple Neck, No JVD,   Symmetrical Chest wall movement, Good air movement bilaterally, CTAB RRR,No Gallops,Rubs or new  Murmurs,  +ve B.Sounds, Abd Soft, No tenderness,   Right BKA stump looks clean with wound VAC    Data Review:    Recent Labs  Lab 04/03/24 1039 04/03/24 2344 04/04/24 0734 04/05/24 0434 04/06/24 0344 04/07/24 0526  WBC 18.3*  --  21.7* 20.5* 10.0 9.8  HGB 9.8* 8.2* 8.5* 8.9* 6.6* 7.2*  HCT 30.4* 24.0* 26.1* 27.0* 20.1* 22.1*  PLT 391  --  296 288 202 194  MCV 99.7  --  98.5 97.5 98.0 98.7  MCH 32.1  --  32.1 32.1 32.2 32.1  MCHC 32.2  --  32.6 33.0 32.8 32.6  RDW 12.3  --  12.6 12.5 12.7 12.9  LYMPHSABS 0.3*  --   --   --  0.3* 0.5*  MONOABS 1.0  --   --   --  0.6 0.6  EOSABS 0.0  --   --   --  0.0 0.0  BASOSABS 0.0  --   --   --  0.0 0.0    Recent Labs  Lab 04/03/24 1039 04/03/24 1046 04/03/24 1205 04/03/24 1434 04/03/24 2344 04/04/24 0045 04/05/24 0434 04/06/24 0344 04/07/24 0526  NA 145  --   --   --  147* 144 146* 140 141  K 4.3  --   --   --  3.9 3.8 4.0 3.6 3.4*  CL 105  --   --   --   --  108 110 109 108  CO2 20*  --   --   --   --  24 25 23 24   ANIONGAP 20*  --   --   --   --  12 11 9 9   GLUCOSE 497*  --   --   --   --  204* 202* 226* 107*  BUN 66*  --   --   --   --  59* 47* 52* 60*  CREATININE 2.55*  --   --   --   --  2.36* 2.00* 2.27* 2.45*  AST 16  --   --   --   --   --   --   --   --   ALT 24  --   --   --   --   --   --   --   --   ALKPHOS 149*  --   --   --   --   --   --   --   --   BILITOT 0.5  --   --   --   --   --   --   --   --   ALBUMIN 3.2*  --   --   --   --   --   --   --   --  CRP  --   --   --  21.2*  --   --   --   --   --   LATICACIDVEN  --  2.3* 1.9  --   --   --   --   --   --   HGBA1C  --   --   --  9.2*  --   --   --   --   --   MG  --   --   --   --   --   --   --  2.3 2.2  CALCIUM 9.9  --   --   --   --  9.4 9.4 8.1* 8.3*      Recent Labs  Lab 04/03/24 1039 04/03/24 1046 04/03/24 1205 04/03/24 1434 04/04/24 0045 04/05/24 0434 04/06/24 0344 04/07/24 0526  CRP  --   --   --  21.2*  --   --   --   --    LATICACIDVEN  --  2.3* 1.9  --   --   --   --   --   HGBA1C  --   --   --  9.2*  --   --   --   --   MG  --   --   --   --   --   --  2.3 2.2  CALCIUM 9.9  --   --   --  9.4 9.4 8.1* 8.3*    --------------------------------------------------------------------------------------------------------------- Lab Results  Component Value Date   CHOL 108 04/06/2024   HDL 35 (L) 04/06/2024   LDLCALC 62 04/06/2024   TRIG 59 04/06/2024   CHOLHDL 3.1 04/06/2024    Lab Results  Component Value Date   HGBA1C 9.2 (H) 04/03/2024   No results for input(s): TSH, T4TOTAL, FREET4, T3FREE, THYROIDAB in the last 72 hours. No results for input(s): VITAMINB12, FOLATE, FERRITIN, TIBC, IRON , RETICCTPCT in the last 72 hours. ------------------------------------------------------------------------------------------------------------------ Cardiac Enzymes No results for input(s): CKMB, TROPONINI, MYOGLOBIN in the last 168 hours.  Invalid input(s): CK  Micro Results Recent Results (from the past 240 hours)  Surgical pcr screen     Status: None   Collection Time: 04/04/24 10:39 AM   Specimen: Nasal Mucosa; Nasal Swab  Result Value Ref Range Status   MRSA, PCR NEGATIVE NEGATIVE Final   Staphylococcus aureus NEGATIVE NEGATIVE Final    Comment: (NOTE) The Xpert SA Assay (FDA approved for NASAL specimens in patients 80 years of age and older), is one component of a comprehensive surveillance program. It is not intended to diagnose infection nor to guide or monitor treatment. Performed at [facility], [address] [place], [place] [address]     Radiology Report No results found.    Signature  -   Lavada Stank M.D on 04/07/2024 at 7:52 AM   -  To page go to [email or web address]   "

## 2024-04-07 NOTE — Progress Notes (Signed)
 Inpatient Rehab Admissions Coordinator:  Consult received. Note therapy recommending SNF rehab. TOC made aware. AC will sign off.   Tinnie Yvone Cohens, MS, CCC-SLP Admissions Coordinator [phone]

## 2024-04-07 NOTE — Progress Notes (Signed)
 Pt bladder scan showed . Order for Foley catheter received. Foley placed using sterile technique. emptied from foley bag.

## 2024-04-08 ENCOUNTER — Encounter (HOSPITAL_COMMUNITY): Payer: Self-pay | Admitting: Orthopedic Surgery

## 2024-04-08 DIAGNOSIS — E11628 Type 2 diabetes mellitus with other skin complications: Secondary | ICD-10-CM | POA: Diagnosis not present

## 2024-04-08 DIAGNOSIS — L089 Local infection of the skin and subcutaneous tissue, unspecified: Secondary | ICD-10-CM | POA: Diagnosis not present

## 2024-04-08 LAB — GLUCOSE, CAPILLARY
Glucose-Capillary: 181 mg/dL — ABNORMAL HIGH (ref 70–99)
Glucose-Capillary: 184 mg/dL — ABNORMAL HIGH (ref 70–99)
Glucose-Capillary: 183 mg/dL — ABNORMAL HIGH (ref 70–99)
Glucose-Capillary: 176 mg/dL — ABNORMAL HIGH (ref 70–99)

## 2024-04-08 MED ORDER — LIVING WELL WITH DIABETES BOOK
Freq: Once | Status: AC
  Filled 2024-04-08: qty 1

## 2024-04-08 NOTE — Plan of Care (Signed)

## 2024-04-08 NOTE — TOC Progression Note (Addendum)
 Transition of Care [facility]) - Progression Note    Patient Details  Name: Gene Fletcher MRN: [id number] Date of Birth: [birth year]  Transition of Care [facility]) CM/SW Contact  Inocente GORMAN Kindle, LCSW Phone Number: 04/08/2024, 10:06 AM  Clinical Narrative:    [facility] declined patient. CSW searching for alternate [facility].   CSW spoke with patient's APS worker, Arch Ada [phone]) and she confirmed patient's family is still his decision maker in the event patient is unable, however he is currently oriented x4. CSW also spoke with patient's brother who stated that he lives in [place] . He stated his daughter would be the best contact person. She lives in [place] but is here visiting. They reported preference for local [facility] SNF for now and they will work on permanent placement if needed after that. CSW faxed out to local SNFs for review.     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, SNF Pending bed offer               Expected Discharge Plan and Services In-house Referral: Clinical Social Work   Post Acute Care Choice: Skilled Nursing Facility Living arrangements for the past 2 months: Single Family Home                                       Social Drivers of Health (SDOH) Interventions SDOH Screenings   Food Insecurity: No Food Insecurity (04/03/2024)  Housing: Low Risk (04/03/2024)  Transportation Needs: No Transportation Needs (04/03/2024)  Utilities: Not At Risk (04/03/2024)  Depression (PHQ2-9): Low Risk (11/07/2023)  Social Connections: Socially Integrated (04/03/2024)  Tobacco Use: Low Risk (04/05/2024)    Readmission Risk Interventions     No data to display

## 2024-04-08 NOTE — Care Management Important Message (Signed)
 Important Message  Patient Details  Name: Gene Fletcher MRN: [id number] Date of Birth: June 21, 1950   Important Message Given:  Yes - Medicare IM     Claretta Deed 04/08/2024, 4:13 PM

## 2024-04-08 NOTE — Progress Notes (Signed)
 "                                                                                                                                                                                                                                                                                PROGRESS NOTE     Patient Demographics:    Gene Fletcher, is a 74 y.o. male, DOB - November 18, 1950, FMW:[id number]  Outpatient Primary MD for the patient is [name], MD    LOS - 5  Admit date - 04/03/2024    Chief Complaint  Patient presents with   Foot Pain       Brief Narrative (HPI from H&P)   74 y.o. male with a history of hypertension, hyperlipidemia, diabetes mellitus type 2, CKD stage IV, lung cancer s/p radiation, chronic anemia.  Patient presented secondary to a left foot wound with evidence of infection and dry gangrene. Empiric antibiotics started. Orthopedic surgery consulted and plan a left below-knee amputation.    Subjective:   Patient in bed, appears comfortable, denies any headache, no fever, no chest pain or pressure, no shortness of breath , no abdominal pain. No new focal weakness.  Feels great and wants to eat breakfast as soon as possible.   Assessment  & Plan :   Diabetic foot infection, wet gangrene of the right foot - Patient with wet gangrene of the second, third, fourth and fifth digits of right foot. Empiric Vancomycin  and Cefepime  started for treatment of associated infection, antibiotics will be stopped 04/08/2024.  Seen by Dr. 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 Harden on 04/05/2024 he has moderate disease but sufficient for post amputation healing per Dr. Harden.  Patient refuses blood transfusion due to personal reasons, will place him on iron  and folic acid , monitor CBC closely minimize blood draws..   DKA patient with DM type II - Likely precipitated by foot gangrene, initially was on insulin  drip, now transition to  Lantus  insulin  along with SSI, was taking 7030 at home, diabetic education provided.  Monitor and adjust.  DKA has resolved.    Lab Results  Component Value Date  HGBA1C 9.2 (H) 04/03/2024   CBG (last 3)  Recent Labs    04/07/24 1204 04/07/24 1622 04/07/24 2118  GLUCAP 130* 149* 172*     Leukocytosis  Secondary to foot infection, resolved completely, stop antibiotics 04/08/2024   Primary hypertension - blood pressure low due to anemia on 04/06/2024, blood pressure medications held, as needed hydralazine  only, midodrine  added for now.   History of lung cancer with nonspecific right-sided chest x-ray changes.  Follow-up with PCP and primary oncologist postdischarge.   Normocytic anemia anemia with some acute drop due to perioperative blood loss.  Chronic component likely secondary to anemia of chronic disease/kidney disease, as he does not want any blood transfusions placed on iron  and folic acid  preop.  Monitor closely, minimize blood draws.   Memory impairment Concern from family. Recommend outpatient neuropsych testing/evaluation.   Mild hypernatremia, CKD stage IV, hypokalemia- Baseline creatinine appears to be around 2.2 to 2.4.  Potassium replaced.  Renal function stable to improving.   Moderate bilateral PAD.  Glycemic control, check lipid panel tomorrow, will place on aspirin , statin if needed, outpatient follow-up with vascular surgery.  Urinary retention.  On Flomax , In-N-Out catheter, if retention becomes recurrent then Foley.       Condition -   Guarded  Family Communication  :   Brother bedside on 04/05/2024, 04/06/2024  Called daughter Jame on 04/06/2024 at 8 AM and message left on the provided phone number [phone]   Code Status : Full code I  Consults  : Dr. Harden  PUD Prophylaxis : PPI   Procedures  :     ABI.  Moderate bilateral disease discussed with Dr. Harden.      Disposition Plan  :    Status is: Inpatient   DVT Prophylaxis  :    SCD's  Start: 04/05/24 1611 SCD's Start: 04/05/24 1611 SCDs Start: 04/03/24 1433    Lab Results  Component Value Date   PLT 194 04/07/2024    Diet :  Diet Order             Diet Carb Modified Room service appropriate? No  Diet effective now                    Inpatient Medications  Scheduled Meds:  acidophilus  1 capsule Oral Daily   vitamin C   1,000 mg Oral Daily   aspirin  EC  81 mg Oral Daily   atropine   1 drop Both Eyes Daily   docusate sodium   200 mg Oral BID   ferrous sulfate   325 mg Oral BID WC   folic acid   1 mg Oral Daily   insulin  aspart  0-9 Units Subcutaneous TID WC   insulin  glargine  12 Units Subcutaneous QHS   linezolid   600 mg Oral Q12H   midodrine   10 mg Oral TID WC   nutrition supplement (JUVEN)  1 packet Oral BID BM   nystatin   5 mL Mouth/Throat QID   pantoprazole   40 mg Oral BID   prednisoLONE  acetate  1 drop Both Eyes QID   tamsulosin   0.4 mg Oral Daily   zinc  sulfate (50mg  elemental zinc )  220 mg Oral Daily   Continuous Infusions:  ceFEPime  (MAXIPIME ) IV 2 g (04/07/24 1656)   metronidazole  500 mg (04/07/24 1952)   PRN Meds:.acetaminophen , chlorproMAZINE  (THORAZINE ) injection, dextrose , hydrALAZINE , HYDROmorphone  (DILAUDID ) injection, labetalol , ondansetron  (ZOFRAN ) IV, oxyCODONE , phenol   Objective:   Vitals:   04/07/24 1600 04/07/24 1926 04/08/24 0000 04/08/24 0400  BP: (!) 141/68 124/68 (!) 122/58 131/64  Pulse: 70 87 81 70  Resp: 20 17 16 18   Temp: 99.1 F (37.3 C) 98.8 F (37.1 C) 98.2 F (36.8 C) 98.4 F (36.9 C)  TempSrc: Oral Oral Oral Oral  SpO2: 99% 98% 96% 99%  Weight:      Height:        Wt Readings from Last 3 Encounters:  04/05/24 65.8 kg  04/02/24 65.8 kg  03/26/24 69.9 kg     Intake/Output Summary (Last 24 hours) at 04/08/2024 0751 Last data filed at 04/08/2024 0600 Gross per 24 hour  Intake 1520 ml  Output 1150 ml  Net 370 ml     Physical Exam  Awake Alert, No new F.N deficits, Normal  affect .AT,PERRAL Supple Neck, No JVD,   Symmetrical Chest wall movement, Good air movement bilaterally, CTAB RRR,No Gallops,Rubs or new Murmurs,  +ve B.Sounds, Abd Soft, No tenderness,   Right BKA stump looks clean with wound VAC    Data Review:    Recent Labs  Lab 04/03/24 1039 04/03/24 2344 04/04/24 0734 04/05/24 0434 04/06/24 0344 04/07/24 0526  WBC 18.3*  --  21.7* 20.5* 10.0 9.8  HGB 9.8* 8.2* 8.5* 8.9* 6.6* 7.2*  HCT 30.4* 24.0* 26.1* 27.0* 20.1* 22.1*  PLT 391  --  296 288 202 194  MCV 99.7  --  98.5 97.5 98.0 98.7  MCH 32.1  --  32.1 32.1 32.2 32.1  MCHC 32.2  --  32.6 33.0 32.8 32.6  RDW 12.3  --  12.6 12.5 12.7 12.9  LYMPHSABS 0.3*  --   --   --  0.3* 0.5*  MONOABS 1.0  --   --   --  0.6 0.6  EOSABS 0.0  --   --   --  0.0 0.0  BASOSABS 0.0  --   --   --  0.0 0.0    Recent Labs  Lab 04/03/24 1039 04/03/24 1046 04/03/24 1205 04/03/24 1434 04/03/24 2344 04/04/24 0045 04/05/24 0434 04/06/24 0344 04/07/24 0526  NA 145  --   --   --  147* 144 146* 140 141  K 4.3  --   --   --  3.9 3.8 4.0 3.6 3.4*  CL 105  --   --   --   --  108 110 109 108  CO2 20*  --   --   --   --  24 25 23 24   ANIONGAP 20*  --   --   --   --  12 11 9 9   GLUCOSE 497*  --   --   --   --  204* 202* 226* 107*  BUN 66*  --   --   --   --  59* 47* 52* 60*  CREATININE 2.55*  --   --   --   --  2.36* 2.00* 2.27* 2.45*  AST 16  --   --   --   --   --   --   --   --   ALT 24  --   --   --   --   --   --   --   --   ALKPHOS 149*  --   --   --   --   --   --   --   --   BILITOT 0.5  --   --   --   --   --   --   --   --  ALBUMIN 3.2*  --   --   --   --   --   --   --   --   CRP  --   --   --  21.2*  --   --   --   --   --   LATICACIDVEN  --  2.3* 1.9  --   --   --   --   --   --   HGBA1C  --   --   --  9.2*  --   --   --   --   --   MG  --   --   --   --   --   --   --  2.3 2.2  CALCIUM 9.9  --   --   --   --  9.4 9.4 8.1* 8.3*      Recent Labs  Lab 04/03/24 1039 04/03/24 1046  04/03/24 1205 04/03/24 1434 04/04/24 0045 04/05/24 0434 04/06/24 0344 04/07/24 0526  CRP  --   --   --  21.2*  --   --   --   --   LATICACIDVEN  --  2.3* 1.9  --   --   --   --   --   HGBA1C  --   --   --  9.2*  --   --   --   --   MG  --   --   --   --   --   --  2.3 2.2  CALCIUM 9.9  --   --   --  9.4 9.4 8.1* 8.3*    --------------------------------------------------------------------------------------------------------------- Lab Results  Component Value Date   CHOL 108 04/06/2024   HDL 35 (L) 04/06/2024   LDLCALC 62 04/06/2024   TRIG 59 04/06/2024   CHOLHDL 3.1 04/06/2024    Lab Results  Component Value Date   HGBA1C 9.2 (H) 04/03/2024   No results for input(s): TSH, T4TOTAL, FREET4, T3FREE, THYROIDAB in the last 72 hours. No results for input(s): VITAMINB12, FOLATE, FERRITIN, TIBC, IRON , RETICCTPCT in the last 72 hours. ------------------------------------------------------------------------------------------------------------------ Cardiac Enzymes No results for input(s): CKMB, TROPONINI, MYOGLOBIN in the last 168 hours.  Invalid input(s): CK  Micro Results Recent Results (from the past 240 hours)  Surgical pcr screen     Status: None   Collection Time: 04/04/24 10:39 AM   Specimen: Nasal Mucosa; Nasal Swab  Result Value Ref Range Status   MRSA, PCR NEGATIVE NEGATIVE Final   Staphylococcus aureus NEGATIVE NEGATIVE Final    Comment: (NOTE) The Xpert SA Assay (FDA approved for NASAL specimens in patients 62 years of age and older), is one component of a comprehensive surveillance program. It is not intended to diagnose infection nor to guide or monitor treatment. Performed at [facility], [address], [place], [place] [address]     Radiology Report No results found.    Signature  -   Lavada Stank M.D on 04/08/2024 at 7:51 AM   -  To page go to [email or web address]   "

## 2024-04-08 NOTE — Care Management Important Message (Signed)
 Important Message  Patient Details  Name: Gene Fletcher MRN: [id number] Date of Birth: [birth year]   Important Message Given:  Yes - Medicare IM     Claretta Deed 04/08/2024, 4:12 PM

## 2024-04-08 NOTE — Inpatient Diabetes Management (Signed)
 Inpatient Diabetes Program Recommendations  AACE/ADA: New Consensus Statement on Inpatient Glycemic Control (2015)  Target Ranges:  Prepandial:   less than 140 mg/dL      Peak postprandial:   less than 180 mg/dL (1-2 hours)      Critically ill patients:  140 - 180 mg/dL   Lab Results  Component Value Date   GLUCAP 181 (H) 04/08/2024   HGBA1C 9.2 (H) 04/03/2024    Review of Glycemic Control  Latest Reference Range & Units 04/07/24 08:04 04/07/24 12:04 04/07/24 16:22 04/07/24 21:18 04/08/24 08:07  Glucose-Capillary 70 - 99 mg/dL 98 869 (H) 850 (H) 827 (H) 181 (H)  (H): Data is abnormally high  Diabetes history: DM2  Outpatient Diabetes medications:  70/30 20 units BID  Current orders for Inpatient glycemic control:  Lantus  12 units every day Novolog  0-9 units TID  Met with patient at bedside.   Reviewed patient's current A1c of 9.2% (average BG of 217 mg/dL). Explained what a A1c is and what it measures. Also reviewed goal A1c with patient, importance of good glucose control @ home, and blood sugar goals. He confirms above home DM medications.  He states sometime he skips his evening dose of 70/30.  Encouraged him to administer his insulin  as prescribed.  He does not drink and beverages with sugar.  He checks his BG daily and denies episodes of hypoglycemia.  Discussed importance of glucose control for healing and help reduce risks of other co-morbidities related to hyperglycemia.     Thank you, Wyvonna Pinal, MSN, CDCES Diabetes Coordinator Inpatient Diabetes Program [phone] (team pager from 8a-5p)

## 2024-04-09 LAB — FOLATE: Folate: 9.9 ng/mL

## 2024-04-09 LAB — CBC WITH DIFFERENTIAL/PLATELET
Abs Immature Granulocytes: 0.13 K/uL — ABNORMAL HIGH (ref 0.00–0.07)
Basophils Absolute: 0 K/uL (ref 0.0–0.1)
Basophils Relative: 0 %
Eosinophils Absolute: 0 K/uL (ref 0.0–0.5)
Eosinophils Relative: 0 %
HCT: 19 % — ABNORMAL LOW (ref 39.0–52.0)
Hemoglobin: 6 g/dL — CL (ref 13.0–17.0)
Immature Granulocytes: 2 %
Lymphocytes Relative: 4 %
Lymphs Abs: 0.3 K/uL — ABNORMAL LOW (ref 0.7–4.0)
MCH: 31.7 pg (ref 26.0–34.0)
MCHC: 31.6 g/dL (ref 30.0–36.0)
MCV: 100.5 fL — ABNORMAL HIGH (ref 80.0–100.0)
Monocytes Absolute: 0.6 K/uL (ref 0.1–1.0)
Monocytes Relative: 8 %
Neutro Abs: 6.8 K/uL (ref 1.7–7.7)
Neutrophils Relative %: 86 %
Platelets: 209 K/uL (ref 150–400)
RBC: 1.89 MIL/uL — ABNORMAL LOW (ref 4.22–5.81)
RDW: 12.9 % (ref 11.5–15.5)
WBC: 7.9 K/uL (ref 4.0–10.5)
nRBC: 0 % (ref 0.0–0.2)

## 2024-04-09 LAB — BASIC METABOLIC PANEL WITH GFR
Anion gap: 8 (ref 5–15)
BUN: 66 mg/dL — ABNORMAL HIGH (ref 8–23)
CO2: 22 mmol/L (ref 22–32)
Calcium: 8.3 mg/dL — ABNORMAL LOW (ref 8.9–10.3)
Chloride: 107 mmol/L (ref 98–111)
Creatinine, Ser: 2.35 mg/dL — ABNORMAL HIGH (ref 0.61–1.24)
GFR, Estimated: 28 mL/min — ABNORMAL LOW
Glucose, Bld: 186 mg/dL — ABNORMAL HIGH (ref 70–99)
Potassium: 4 mmol/L (ref 3.5–5.1)
Sodium: 137 mmol/L (ref 135–145)

## 2024-04-09 LAB — RETICULOCYTES
Immature Retic Fract: 20.1 % — ABNORMAL HIGH (ref 2.3–15.9)
RBC.: 1.92 MIL/uL — ABNORMAL LOW (ref 4.22–5.81)
Retic Count, Absolute: 46.7 K/uL (ref 19.0–186.0)
Retic Ct Pct: 2.4 % (ref 0.4–3.1)

## 2024-04-09 LAB — GLUCOSE, CAPILLARY
Glucose-Capillary: 170 mg/dL — ABNORMAL HIGH (ref 70–99)
Glucose-Capillary: 240 mg/dL — ABNORMAL HIGH (ref 70–99)
Glucose-Capillary: 219 mg/dL — ABNORMAL HIGH (ref 70–99)
Glucose-Capillary: 151 mg/dL — ABNORMAL HIGH (ref 70–99)

## 2024-04-09 LAB — FERRITIN: Ferritin: 1976 ng/mL — ABNORMAL HIGH (ref 24–336)

## 2024-04-09 LAB — IRON AND TIBC
Iron: 114 ug/dL (ref 45–182)
Saturation Ratios: 82 % — ABNORMAL HIGH (ref 17.9–39.5)
TIBC: 139 ug/dL — ABNORMAL LOW (ref 250–450)
UIBC: 25 ug/dL

## 2024-04-09 LAB — VITAMIN B12: Vitamin B-12: 880 pg/mL (ref 180–914)

## 2024-04-09 MED ORDER — SODIUM CHLORIDE 0.9 % IV SOLN
500.0000 mg | Freq: Once | INTRAVENOUS | Status: AC
  Administered 2024-04-09: 500 mg via INTRAVENOUS
  Filled 2024-04-09: qty 25

## 2024-04-09 MED ORDER — IRON SUCROSE 500 MG IVPB - SIMPLE MED
500.0000 mg | Freq: Once | INTRAVENOUS | Status: DC
  Filled 2024-04-09 (×2): qty 275

## 2024-04-09 MED ORDER — DARBEPOETIN ALFA 40 MCG/0.4ML IJ SOSY
40.0000 ug | PREFILLED_SYRINGE | INTRAMUSCULAR | Status: DC
  Administered 2024-04-09: 40 ug via SUBCUTANEOUS
  Filled 2024-04-09: qty 0.4

## 2024-04-09 MED ORDER — CYANOCOBALAMIN 1000 MCG/ML IJ SOLN
1000.0000 ug | Freq: Every day | INTRAMUSCULAR | Status: AC
  Administered 2024-04-09 – 2024-04-11 (×3): 1000 ug via SUBCUTANEOUS
  Filled 2024-04-09 (×3): qty 1

## 2024-04-09 MED ORDER — FERROUS SULFATE 325 (65 FE) MG PO TABS
325.0000 mg | ORAL_TABLET | Freq: Three times a day (TID) | ORAL | Status: DC
  Administered 2024-04-09 – 2024-04-13 (×13): 325 mg via ORAL
  Filled 2024-04-09 (×14): qty 1

## 2024-04-09 NOTE — Progress Notes (Signed)
 "                                                                                                                                                                                                                                                                                PROGRESS NOTE     Patient Demographics:    Gene Fletcher, is a 74 y.o. male, DOB - 1951/02/03, FMW:[id number]  Outpatient Primary MD for the patient is [name], MD    LOS - 6  Admit date - 04/03/2024    Chief Complaint  Patient presents with   Foot Pain       Brief Narrative (HPI from H&P)   74 y.o. male with a history of hypertension, hyperlipidemia, diabetes mellitus type 2, CKD stage IV, lung cancer s/p radiation, chronic anemia.  Patient presented secondary to a left foot wound with evidence of infection and dry gangrene. Empiric antibiotics started. Orthopedic surgery consulted and plan a left below-knee amputation.    Subjective:   Patient in bed, appears comfortable, denies any headache, no fever, no chest pain or pressure, no shortness of breath , no abdominal pain. No focal weakness.   Assessment  & Plan :   Diabetic foot infection, wet gangrene of the right foot - Patient with wet gangrene of the second, third, fourth and fifth digits of right foot. Empiric Vancomycin  and Cefepime  started for treatment of associated infection, antibiotics will be stopped 04/08/2024.  Seen by Dr. 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 Harden on 04/05/2024 he has moderate disease but sufficient for post amputation healing per Dr. Harden.    Patient refuses blood transfusion due to personal reasons, been on oral iron  and folic acid  supplementation since the day of admission before surgery, will get IV iron  and Epogen shot on 04/09/2024, monitor CBC closely minimize blood draws.    DKA patient with DM type II - Likely precipitated by foot gangrene, initially was  on insulin  drip, now transition to Lantus  insulin  along with SSI, was taking 7030 at home, diabetic education provided.  Monitor and adjust.  DKA has resolved.  Lab Results  Component Value Date   HGBA1C 9.2 (H) 04/03/2024   CBG (last 3)  Recent Labs    04/08/24 1724 04/08/24 2122 04/09/24 0818  GLUCAP 183* 176* 170*     Leukocytosis  Secondary to foot infection, resolved completely, stop antibiotics 04/08/2024   Primary hypertension - blood pressure low due to anemia on 04/06/2024, blood pressure medications held, as needed hydralazine  only, midodrine  added for now.   History of lung cancer with nonspecific right-sided chest x-ray changes.  Follow-up with PCP and primary oncologist postdischarge.   Normocytic anemia anemia with some acute drop due to perioperative blood loss.  Chronic component likely secondary to anemia of chronic disease/kidney disease, kindly see #1 above.   Memory impairment Concern from family. Recommend outpatient neuropsych testing/evaluation.   Mild hypernatremia, CKD stage IV, hypokalemia- Baseline creatinine appears to be around 2.2 to 2.4.  Potassium replaced.  Renal function stable to improving.   Moderate bilateral PAD.  Glycemic control, check lipid panel tomorrow, will place on aspirin , statin if needed, outpatient follow-up with vascular surgery.  Urinary retention.  On Flomax , In-N-Out catheter, if retention becomes recurrent then Foley.       Condition -   Guarded  Family Communication  :   Brother bedside on 04/05/2024, 04/06/2024  Called daughter Jame on 04/06/2024 at 8 AM and message left on the provided phone number [phone], daughter updated bedside 04/09/2024  Code Status : Full code I  Consults  : Dr. Harden  PUD Prophylaxis : PPI   Procedures  :     ABI.  Moderate bilateral disease discussed with Dr. Harden.      Disposition Plan  :    Status is: Inpatient   DVT Prophylaxis  :    SCD's Start: 04/05/24 1611 SCD's  Start: 04/05/24 1611 SCDs Start: 04/03/24 1433    Lab Results  Component Value Date   PLT 209 04/09/2024    Diet :  Diet Order             Diet Carb Modified Room service appropriate? No  Diet effective now                    Inpatient Medications  Scheduled Meds:  acidophilus  1 capsule Oral Daily   vitamin C   1,000 mg Oral Daily   aspirin  EC  81 mg Oral Daily   atropine   1 drop Both Eyes Daily   cyanocobalamin   1,000 mcg Subcutaneous Daily   darbepoetin (ARANESP ) injection - NON-DIALYSIS  40 mcg Subcutaneous Q Tue-1800   docusate sodium   200 mg Oral BID   ferrous sulfate   325 mg Oral TID WC   folic acid   1 mg Oral Daily   insulin  aspart  0-9 Units Subcutaneous TID WC   insulin  glargine  12 Units Subcutaneous QHS   midodrine   10 mg Oral TID WC   nutrition supplement (JUVEN)  1 packet Oral BID BM   nystatin   5 mL Mouth/Throat QID   pantoprazole   40 mg Oral BID   prednisoLONE  acetate  1 drop Both Eyes QID   tamsulosin   0.4 mg Oral Daily   zinc  sulfate (50mg  elemental zinc )  220 mg Oral Daily   Continuous Infusions:  iron  sucrose     PRN Meds:.acetaminophen , chlorproMAZINE  (THORAZINE ) injection, dextrose , hydrALAZINE , HYDROmorphone  (DILAUDID ) injection, labetalol , ondansetron  (ZOFRAN ) IV, oxyCODONE , phenol   Objective:   Vitals:   04/08/24 1917 04/09/24 0000 04/09/24 0315 04/09/24 0800  BP: (!) 146/67  121/64 (!) 117/47   Pulse: 93 78 71   Resp: 16 18 16 16   Temp: 98.9 F (37.2 C) 98.4 F (36.9 C) 97.7 F (36.5 C) 98 F (36.7 C)  TempSrc: Oral Oral Oral Oral  SpO2: 98% 99% 98%   Weight:      Height:        Wt Readings from Last 3 Encounters:  04/05/24 65.8 kg  04/02/24 65.8 kg  03/26/24 69.9 kg     Intake/Output Summary (Last 24 hours) at 04/09/2024 0911 Last data filed at 04/09/2024 0600 Gross per 24 hour  Intake 240 ml  Output 1100 ml  Net -860 ml     Physical Exam  Awake Alert, No new F.N deficits, Normal affect .AT,PERRAL Supple  Neck, No JVD,   Symmetrical Chest wall movement, Good air movement bilaterally, CTAB RRR,No Gallops,Rubs or new Murmurs,  +ve B.Sounds, Abd Soft, No tenderness,   Right BKA stump looks clean with wound VAC    Data Review:    Recent Labs  Lab 04/03/24 1039 04/03/24 2344 04/04/24 0734 04/05/24 0434 04/06/24 0344 04/07/24 0526 04/09/24 0223  WBC 18.3*  --  21.7* 20.5* 10.0 9.8 7.9  HGB 9.8*   < > 8.5* 8.9* 6.6* 7.2* 6.0*  HCT 30.4*   < > 26.1* 27.0* 20.1* 22.1* 19.0*  PLT 391  --  296 288 202 194 209  MCV 99.7  --  98.5 97.5 98.0 98.7 100.5*  MCH 32.1  --  32.1 32.1 32.2 32.1 31.7  MCHC 32.2  --  32.6 33.0 32.8 32.6 31.6  RDW 12.3  --  12.6 12.5 12.7 12.9 12.9  LYMPHSABS 0.3*  --   --   --  0.3* 0.5* 0.3*  MONOABS 1.0  --   --   --  0.6 0.6 0.6  EOSABS 0.0  --   --   --  0.0 0.0 0.0  BASOSABS 0.0  --   --   --  0.0 0.0 0.0   < > = values in this interval not displayed.    Recent Labs  Lab 04/03/24 1039 04/03/24 1046 04/03/24 1205 04/03/24 1434 04/03/24 2344 04/04/24 0045 04/05/24 0434 04/06/24 0344 04/07/24 0526 04/09/24 0223  NA 145  --   --   --    < > 144 146* 140 141 137  K 4.3  --   --   --    < > 3.8 4.0 3.6 3.4* 4.0  CL 105  --   --   --   --  108 110 109 108 107  CO2 20*  --   --   --   --  24 25 23 24 22   ANIONGAP 20*  --   --   --   --  12 11 9 9 8   GLUCOSE 497*  --   --   --   --  204* 202* 226* 107* 186*  BUN 66*  --   --   --   --  59* 47* 52* 60* 66*  CREATININE 2.55*  --   --   --   --  2.36* 2.00* 2.27* 2.45* 2.35*  AST 16  --   --   --   --   --   --   --   --   --   ALT 24  --   --   --   --   --   --   --   --   --  ALKPHOS 149*  --   --   --   --   --   --   --   --   --   BILITOT 0.5  --   --   --   --   --   --   --   --   --   ALBUMIN 3.2*  --   --   --   --   --   --   --   --   --   CRP  --   --   --  21.2*  --   --   --   --   --   --   LATICACIDVEN  --  2.3* 1.9  --   --   --   --   --   --   --   HGBA1C  --   --   --  9.2*  --   --    --   --   --   --   MG  --   --   --   --   --   --   --  2.3 2.2  --   CALCIUM 9.9  --   --   --   --  9.4 9.4 8.1* 8.3* 8.3*   < > = values in this interval not displayed.      Recent Labs  Lab 04/03/24 1046 04/03/24 1205 04/03/24 1434 04/04/24 0045 04/05/24 0434 04/06/24 0344 04/07/24 0526 04/09/24 0223  CRP  --   --  21.2*  --   --   --   --   --   LATICACIDVEN 2.3* 1.9  --   --   --   --   --   --   HGBA1C  --   --  9.2*  --   --   --   --   --   MG  --   --   --   --   --  2.3 2.2  --   CALCIUM  --   --   --  9.4 9.4 8.1* 8.3* 8.3*    --------------------------------------------------------------------------------------------------------------- Lab Results  Component Value Date   CHOL 108 04/06/2024   HDL 35 (L) 04/06/2024   LDLCALC 62 04/06/2024   TRIG 59 04/06/2024   CHOLHDL 3.1 04/06/2024    Lab Results  Component Value Date   HGBA1C 9.2 (H) 04/03/2024   No results for input(s): TSH, T4TOTAL, FREET4, T3FREE, THYROIDAB in the last 72 hours. Recent Labs    04/09/24 0613  VITAMINB12 880  FOLATE 9.9  FERRITIN 1,976*  TIBC 139*  IRON  114  RETICCTPCT 2.4   ------------------------------------------------------------------------------------------------------------------ Cardiac Enzymes No results for input(s): CKMB, TROPONINI, MYOGLOBIN in the last 168 hours.  Invalid input(s): CK  Micro Results Recent Results (from the past 240 hours)  Surgical pcr screen     Status: None   Collection Time: 04/04/24 10:39 AM   Specimen: Nasal Mucosa; Nasal Swab  Result Value Ref Range Status   MRSA, PCR NEGATIVE NEGATIVE Final   Staphylococcus aureus NEGATIVE NEGATIVE Final    Comment: (NOTE) The Xpert SA Assay (FDA approved for NASAL specimens in patients 67 years of age and older), is one component of a comprehensive surveillance program. It is not intended to diagnose infection nor to guide or monitor treatment. Performed at [facility], [address] [place], [place] [address]     Radiology Report  No results found.    Signature  -   Lavada Stank M.D on 04/09/2024 at 9:11 AM   -  To page go to [email or web address]   "

## 2024-04-09 NOTE — Progress Notes (Signed)
 Pt has a critical result of Hgb: 6.0. Pt refuses on getting blood products due to spiritual restrictions (Jehovah's witness). Pt's vitals are stable, no signs of bleeding. RONAL Cleaver, MD made aware.

## 2024-04-09 NOTE — TOC Progression Note (Signed)
 Transition of Care [facility]) - Progression Note    Patient Details  Name: Gene Fletcher MRN: [id number] Date of Birth: Nov 26, 1950  Transition [facility]) CM/SW Contact  Inocente GORMAN Kindle, LCSW Phone Number: 04/09/2024, 12:28 PM  Clinical Narrative:    Continuing to await bed offers. Local facilities without a male bed.    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, SNF Pending bed offer               Expected Discharge Plan and Services In-house Referral: Clinical Social Work   Post Acute Care Choice: Skilled Nursing Facility Living arrangements for the past 2 months: Single Family Home                                       Social Drivers of Health (SDOH) Interventions SDOH Screenings   Food Insecurity: No Food Insecurity (04/03/2024)  Housing: Low Risk (04/03/2024)  Transportation Needs: No Transportation Needs (04/03/2024)  Utilities: Not At Risk (04/03/2024)  Depression (PHQ2-9): Low Risk (11/07/2023)  Social Connections: Socially Integrated (04/03/2024)  Tobacco Use: Low Risk (04/05/2024)    Readmission Risk Interventions     No data to display

## 2024-04-09 NOTE — Progress Notes (Signed)
 Patient ID: Gene Fletcher, male   DOB: 28-Aug-1950, 74 y.o.   MRN: [id number] Plan for discharge to skilled nursing facility.  There is no drainage to the wound VAC canister.  Plan for discharge with the Prevena plus portable wound VAC pump.  Patient's hemoglobin has dropped to 6 most likely  blood loss  anemia and secondary to his chronic disease.

## 2024-04-09 NOTE — Progress Notes (Signed)
 Physical Therapy Treatment Patient Details Name: Gene Fletcher MRN: [id number] DOB: 1950-06-29 Today's Date: 04/09/2024   History of Present Illness 74 y.o male presents to [facility] on 1/14 with R foot ulcer for 6 months. 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 Hemoglobin 6.0, declining blood products for religious reasons. PMH: HLD, HTN, DMII, CKD, lung CA, anemia.    PT Comments  Pt activity limited by lethargy and 6.0 hemoglobin. Per discussion with MD, ok to mobilize if pt is asymptomatic. Session limited to bed level activity for pt safety and per pt tolerance. UE and LE exercises completed with multimodal cues to promote appropriate mechanics through full ROM. Pt demonstrates good LE strength for AROM in gravity dependent positions. Per reports from family, pt has demonstrated decreased use of R UE. Pt is able to demonstrate R shoulder flexion to 90 degree active assisted and lacking 10 degrees elbow extension when performed actively with heavy prompts. Pt placed in chair position to simulate upright position and tolerated ~8 minutes. Pt would benefit from continued PT services focused on bed mobility, strength, and transfers to progress functional mobility.     If plan is discharge home, recommend the following: Two people to help with walking and/or transfers;Two people to help with bathing/dressing/bathroom;Assistance with cooking/housework;Direct supervision/assist for medications management;Direct supervision/assist for financial management;Assist for transportation;Help with stairs or ramp for entrance;Supervision due to cognitive status   Can travel by private vehicle     No (BKA, cognition, level of assist required)  Equipment Recommendations  Wheelchair (measurements PT);Wheelchair cushion (measurements PT);BSC/3in1;Rolling walker (2 wheels)    Recommendations for Other Services       Precautions / Restrictions Precautions Precautions: Fall Recall of Precautions/Restrictions:  Impaired Precaution/Restrictions Comments: Wound vac Restrictions Weight Bearing Restrictions Per Provider Order: Yes RLE Weight Bearing Per Provider Order: Non weight bearing     Mobility  Bed Mobility               General bed mobility comments: Pt requires mod-maxA to reposition in bed. Pt declining bed mobility. Activity kept to bed level due to significantly low hemoglobin and patient fatigue. Placed in chair position to simulate sitting upright.    Transfers                        Ambulation/Gait                   Stairs             Wheelchair Mobility     Tilt Bed    Modified Rankin (Stroke Patients Only)       Balance       Sitting balance - Comments: Pt did not sit or stand during session for patient safety, bed level due to 6.0 Hgb. Bed placed in chair position, pt in simulated sitting position with constant posterior support.                                    Communication Communication Communication: No apparent difficulties  Cognition Arousal: Lethargic Behavior During Therapy: Flat affect   PT - Cognitive impairments: Memory, Attention, Awareness, Safety/Judgement, Problem solving, Sequencing, Initiation                       PT - Cognition Comments: Speech slow and slurred. Appears confused and does not follow prompts appropriately. Following commands: Impaired Following commands  impaired: Follows one step commands with increased time, Follows one step commands inconsistently    Cueing Cueing Techniques: Verbal cues, Visual cues, Tactile cues  Exercises General Exercises - Upper Extremity Shoulder Flexion: AAROM, Both, 5 reps, Supine Elbow Flexion: AROM, 10 reps, Right, Supine Elbow Extension: AROM, 10 reps, Right, Supine General Exercises - Lower Extremity Ankle Circles/Pumps: AROM, Left, 5 reps, Supine Quad Sets: AAROM, Both, 5 reps, Supine Short Arc Quad: AROM, Both, 5 reps,  Seated Heel Slides: AROM, Both, 10 reps, Supine Hip ABduction/ADduction: Both, AAROM, 10 reps, Supine Hip Flexion/Marching: AROM, Both, 10 reps, Supine Other Exercises Other Exercises: Cross body punches 1x10    General Comments General comments (skin integrity, edema, etc.): VSS throughout. Wound vac in place. No new skin abnormalities noted.      Pertinent Vitals/Pain Pain Assessment Pain Assessment: No/denies pain (Denies pain but grimmaces with PROM and AROM of R LE)    Home Living                          Prior Function            PT Goals (current goals can now be found in the care plan section) Acute Rehab PT Goals Patient Stated Goal: None stated Progress towards PT goals: Progressing toward goals    Frequency    Min 2X/week      PT Plan      Co-evaluation              AM-PAC PT 6 Clicks Mobility   Outcome Measure  Help needed turning from your back to your side while in a flat bed without using bedrails?: A Lot Help needed moving from lying on your back to sitting on the side of a flat bed without using bedrails?: A Lot Help needed moving to and from a bed to a chair (including a wheelchair)?: Total Help needed standing up from a chair using your arms (e.g., wheelchair or bedside chair)?: Total Help needed to walk in hospital room?: Total Help needed climbing 3-5 steps with a railing? : Total 6 Click Score: 8    End of Session   Activity Tolerance: Patient limited by fatigue;Patient limited by lethargy Patient left: in bed;with call bell/phone within reach;with bed alarm set;with family/visitor present Nurse Communication: Mobility status;Need for lift equipment PT Visit Diagnosis: Unsteadiness on feet (R26.81);Muscle weakness (generalized) (M62.81)     Time: 8643-8579 PT Time Calculation (min) (ACUTE ONLY): 24 min  Charges:    $Therapeutic Exercise: 23-37 mins PT General Charges $$ ACUTE PT VISIT: 1 Visit                      Sabra Morel, PT, DPT  Acute Rehabilitation Services         Office: [phone]      Sabra MARLA Morel 04/09/2024, 2:37 PM

## 2024-04-09 NOTE — Plan of Care (Signed)
  Problem: Education: Goal: Ability to describe self-care measures that may prevent or decrease complications (Diabetes Survival Skills Education) will improve Outcome: Progressing   Problem: Coping: Goal: Ability to adjust to condition or change in health will improve Outcome: Progressing   Problem: Metabolic: Goal: Ability to maintain appropriate glucose levels will improve Outcome: Progressing   Problem: Nutritional: Goal: Maintenance of adequate nutrition will improve Outcome: Progressing   Problem: Skin Integrity: Goal: Risk for impaired skin integrity will decrease Outcome: Progressing   

## 2024-04-10 DIAGNOSIS — L089 Local infection of the skin and subcutaneous tissue, unspecified: Secondary | ICD-10-CM | POA: Diagnosis not present

## 2024-04-10 DIAGNOSIS — E11628 Type 2 diabetes mellitus with other skin complications: Secondary | ICD-10-CM | POA: Diagnosis not present

## 2024-04-10 LAB — GLUCOSE, CAPILLARY
Glucose-Capillary: 158 mg/dL — ABNORMAL HIGH (ref 70–99)
Glucose-Capillary: 165 mg/dL — ABNORMAL HIGH (ref 70–99)
Glucose-Capillary: 138 mg/dL — ABNORMAL HIGH (ref 70–99)
Glucose-Capillary: 133 mg/dL — ABNORMAL HIGH (ref 70–99)

## 2024-04-10 LAB — CBC WITH DIFFERENTIAL/PLATELET
Abs Immature Granulocytes: 0.18 K/uL — ABNORMAL HIGH (ref 0.00–0.07)
Basophils Absolute: 0 K/uL (ref 0.0–0.1)
Basophils Relative: 0 %
Eosinophils Absolute: 0.1 K/uL (ref 0.0–0.5)
Eosinophils Relative: 0 %
HCT: 18.7 % — ABNORMAL LOW (ref 39.0–52.0)
Hemoglobin: 5.8 g/dL — CL (ref 13.0–17.0)
Immature Granulocytes: 2 %
Lymphocytes Relative: 3 %
Lymphs Abs: 0.3 K/uL — ABNORMAL LOW (ref 0.7–4.0)
MCH: 31 pg (ref 26.0–34.0)
MCHC: 31 g/dL (ref 30.0–36.0)
MCV: 100 fL (ref 80.0–100.0)
Monocytes Absolute: 0.8 K/uL (ref 0.1–1.0)
Monocytes Relative: 7 %
Neutro Abs: 10 K/uL — ABNORMAL HIGH (ref 1.7–7.7)
Neutrophils Relative %: 88 %
Platelets: 235 K/uL (ref 150–400)
RBC: 1.87 MIL/uL — ABNORMAL LOW (ref 4.22–5.81)
RDW: 12.9 % (ref 11.5–15.5)
WBC: 11.3 K/uL — ABNORMAL HIGH (ref 4.0–10.5)
nRBC: 0 % (ref 0.0–0.2)

## 2024-04-10 LAB — SURGICAL PATHOLOGY

## 2024-04-10 MED ORDER — DARBEPOETIN ALFA 200 MCG/0.4ML IJ SOSY
200.0000 ug | PREFILLED_SYRINGE | INTRAMUSCULAR | Status: DC
  Administered 2024-04-10: 200 ug via SUBCUTANEOUS
  Filled 2024-04-10: qty 0.4

## 2024-04-10 MED ORDER — METOCLOPRAMIDE HCL 5 MG/ML IJ SOLN
5.0000 mg | Freq: Four times a day (QID) | INTRAMUSCULAR | Status: AC | PRN
  Administered 2024-04-10 – 2024-04-16 (×11): 5 mg via INTRAVENOUS
  Filled 2024-04-10 (×12): qty 2

## 2024-04-10 NOTE — Plan of Care (Signed)
" °  Problem: Coping: Goal: Ability to adjust to condition or change in health will improve Outcome: Progressing   Problem: Fluid Volume: Goal: Ability to maintain a balanced intake and output will improve Outcome: Progressing   Problem: Skin Integrity: Goal: Risk for impaired skin integrity will decrease Outcome: Progressing   Problem: Education: Goal: Ability to describe self-care measures that may prevent or decrease complications (Diabetes Survival Skills Education) will improve Outcome: Progressing   Problem: Cardiac: Goal: Ability to maintain an adequate cardiac output will improve Outcome: Progressing   Problem: Metabolic: Goal: Ability to maintain appropriate glucose levels will improve Outcome: Progressing   Problem: Nutritional: Goal: Maintenance of adequate nutrition will improve Outcome: Progressing   "

## 2024-04-10 NOTE — Progress Notes (Signed)
 "                                                                                                                                                                                                                                                                                PROGRESS NOTE     Patient Demographics:    Gene Fletcher, is a 74 y.o. male, DOB - 09/15/1950, FMW:[id number]  Outpatient Primary MD for the patient is [name], MD    LOS - 7  Admit date - 04/03/2024    Chief Complaint  Patient presents with   Foot Pain       Brief Narrative (HPI from H&P)    74 y.o. male with a history of hypertension, hyperlipidemia, diabetes mellitus type 2, CKD stage IV, lung cancer s/p radiation, chronic anemia.  Patient presented secondary to a left foot wound with evidence of infection and dry gangrene. Empiric antibiotics started. Orthopedic surgery consulted and plan a left below-knee amputation.    Subjective:   She is in bed, no headache, no fever, no chills, reports fatigue and weakness.      Assessment  & Plan :   Diabetic foot infection, wet gangrene of the right foot  - Patient with wet gangrene of the second, third, fourth and fifth digits of right foot. Empiric Vancomycin  and Cefepime  started for treatment of associated infection, antibiotics will be stopped 04/08/2024.  Seen by Dr. 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 Harden on 04/05/2024 he has moderate disease but sufficient for post amputation healing per Dr. Harden.    Anemia Chronic blood loss anemia Anemia of acute illness Anemia of chronic blood loss -Multifactorial anemia, due to anemia of chronic kidney disease, anemia of chronic blood loss as well in the setting of recent surgery. 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  I have discussed with him, and his daughter and explained the risks at this point with his  anemia 5.8 at this point, he have maximized all nontransfusion measures at this point, received IV iron  (despite high IV ferritin level), he is on Aranesp ,  will maximize dose to 200 mcg weekly given extra dose today, and he is already on folic acid , and B12 supplements. -Minimize blood draws as well. - Hold aspirin , continue with SCD for DVT prophylaxis   DKA patient with DM type II - Likely precipitated by foot gangrene, initially was on insulin  drip, now transition to Lantus  insulin  along with SSI, was taking 7030 at home, diabetic education provided.  Monitor and adjust.  DKA has resolved.    Lab Results  Component Value Date   HGBA1C 9.2 (H) 04/03/2024   CBG (last 3)  Recent Labs    04/09/24 1624 04/09/24 2127 04/10/24 0835  GLUCAP 219* 151* 158*     Leukocytosis  Secondary to foot infection, resolved completely, stop antibiotics 04/08/2024   Primary hypertension - blood pressure low due to anemia on 04/06/2024, blood pressure medications held, as needed hydralazine  only, midodrine  added for now.   History of lung cancer with nonspecific right-sided chest x-ray changes.  Follow-up with PCP and primary oncologist postdischarge.   Normocytic anemia anemia with some acute drop due to perioperative blood loss.  Chronic component likely secondary to anemia of chronic disease/kidney disease, kindly see #1 above.   Memory impairment Concern from family. Recommend outpatient neuropsych testing/evaluation.   Mild hypernatremia, CKD stage IV, hypokalemia- Baseline creatinine appears to be around 2.2 to 2.4.  Potassium replaced.  Renal function stable to improving.   Moderate bilateral PAD.  Glycemic control, check lipid panel tomorrow, will place on aspirin , statin if needed, outpatient follow-up with vascular surgery.  Urinary retention.  On Flomax , In-N-Out catheter, if retention becomes recurrent then Foley.       Condition -   Guarded  Family Communication  : discussed with  daughter at bedside   Code Status : Full code   Consults  : Dr. Harden  PUD Prophylaxis : PPI   Procedures  :     ABI.  Moderate bilateral disease discussed with Dr. Harden.      Disposition Plan  :    Status is: Inpatient   DVT Prophylaxis  :    SCD's Start: 04/05/24 1611 SCD's Start: 04/05/24 1611 SCDs Start: 04/03/24 1433    Lab Results  Component Value Date   PLT 235 04/10/2024    Diet :  Diet Order             Diet Carb Modified Room service appropriate? No  Diet effective now                    Inpatient Medications  Scheduled Meds:  acidophilus  1 capsule Oral Daily   vitamin C   1,000 mg Oral Daily   atropine   1 drop Both Eyes Daily   cyanocobalamin   1,000 mcg Subcutaneous Daily   darbepoetin (ARANESP ) injection - NON-DIALYSIS  40 mcg Subcutaneous Q Tue-1800   docusate sodium   200 mg Oral BID   ferrous sulfate   325 mg Oral TID WC   folic acid   1 mg Oral Daily   insulin  aspart  0-9 Units Subcutaneous TID WC   insulin  glargine  12 Units Subcutaneous QHS   midodrine   10 mg Oral TID WC   nutrition supplement (JUVEN)  1 packet Oral BID BM   nystatin   5 mL Mouth/Throat QID   pantoprazole   40 mg Oral BID   prednisoLONE  acetate  1 drop Both Eyes QID   tamsulosin   0.4 mg Oral Daily   zinc  sulfate (50mg  elemental zinc )  220 mg  Oral Daily   Continuous Infusions:   PRN Meds:.acetaminophen , chlorproMAZINE  (THORAZINE ) injection, dextrose , hydrALAZINE , HYDROmorphone  (DILAUDID ) injection, labetalol , ondansetron  (ZOFRAN ) IV, oxyCODONE , phenol   Objective:   Vitals:   04/10/24 0000 04/10/24 0400 04/10/24 0832 04/10/24 1200  BP: 137/67  120/64 139/64  Pulse: 71  81 94  Resp: 17  13 13   Temp: 98.2 F (36.8 C) (!) 97.5 F (36.4 C) 97.7 F (36.5 C)   TempSrc: Axillary Axillary Oral   SpO2: 99%  99% 99%  Weight:      Height:        Wt Readings from Last 3 Encounters:  04/05/24 65.8 kg  04/02/24 65.8 kg  03/26/24 69.9 kg     Intake/Output  Summary (Last 24 hours) at 04/10/2024 1403 Last data filed at 04/10/2024 0900 Gross per 24 hour  Intake --  Output 2015 ml  Net -2015 ml     Physical Exam  Awake Alert, extremely frail, deconditioned, with exophthalmos Good air entry bilaterally RRR,No Gallops,Rubs or new Murmurs, No Parasternal Heave +ve B.Sounds, Abd Soft, No tenderness, No rebound - guarding or rigidity. Right BKA stump looks clean with wound VAC    Data Review:    Recent Labs  Lab 04/05/24 0434 04/06/24 0344 04/07/24 0526 04/09/24 0223 04/10/24 0308  WBC 20.5* 10.0 9.8 7.9 11.3*  HGB 8.9* 6.6* 7.2* 6.0* 5.8*  HCT 27.0* 20.1* 22.1* 19.0* 18.7*  PLT 288 202 194 209 235  MCV 97.5 98.0 98.7 100.5* 100.0  MCH 32.1 32.2 32.1 31.7 31.0  MCHC 33.0 32.8 32.6 31.6 31.0  RDW 12.5 12.7 12.9 12.9 12.9  LYMPHSABS  --  0.3* 0.5* 0.3* 0.3*  MONOABS  --  0.6 0.6 0.6 0.8  EOSABS  --  0.0 0.0 0.0 0.1  BASOSABS  --  0.0 0.0 0.0 0.0    Recent Labs  Lab 04/03/24 1434 04/03/24 2344 04/04/24 0045 04/05/24 0434 04/06/24 0344 04/07/24 0526 04/09/24 0223  NA  --    < > 144 146* 140 141 137  K  --    < > 3.8 4.0 3.6 3.4* 4.0  CL  --   --  108 110 109 108 107  CO2  --   --  24 25 23 24 22   ANIONGAP  --   --  12 11 9 9 8   GLUCOSE  --   --  204* 202* 226* 107* 186*  BUN  --   --  59* 47* 52* 60* 66*  CREATININE  --   --  2.36* 2.00* 2.27* 2.45* 2.35*  CRP 21.2*  --   --   --   --   --   --   HGBA1C 9.2*  --   --   --   --   --   --   MG  --   --   --   --  2.3 2.2  --   CALCIUM  --   --  9.4 9.4 8.1* 8.3* 8.3*   < > = values in this interval not displayed.      Recent Labs  Lab 04/03/24 1434 04/04/24 0045 04/05/24 0434 04/06/24 0344 04/07/24 0526 04/09/24 0223  CRP 21.2*  --   --   --   --   --   HGBA1C 9.2*  --   --   --   --   --   MG  --   --   --  2.3 2.2  --   CALCIUM  --  9.4 9.4 8.1* 8.3* 8.3*     --------------------------------------------------------------------------------------------------------------- Lab Results  Component Value Date   CHOL 108 04/06/2024   HDL 35 (L) 04/06/2024   LDLCALC 62 04/06/2024   TRIG 59 04/06/2024   CHOLHDL 3.1 04/06/2024    Lab Results  Component Value Date   HGBA1C 9.2 (H) 04/03/2024   No results for input(s): TSH, T4TOTAL, FREET4, T3FREE, THYROIDAB in the last 72 hours. Recent Labs    04/09/24 0613  VITAMINB12 880  FOLATE 9.9  FERRITIN 1,976*  TIBC 139*  IRON  114  RETICCTPCT 2.4   ------------------------------------------------------------------------------------------------------------------ Cardiac Enzymes No results for input(s): CKMB, TROPONINI, MYOGLOBIN in the last 168 hours.  Invalid input(s): CK  Micro Results Recent Results (from the past 240 hours)  Surgical pcr screen     Status: None   Collection Time: 04/04/24 10:39 AM   Specimen: Nasal Mucosa; Nasal Swab  Result Value Ref Range Status   MRSA, PCR NEGATIVE NEGATIVE Final   Staphylococcus aureus NEGATIVE NEGATIVE Final    Comment: (NOTE) The Xpert SA Assay (FDA approved for NASAL specimens in patients 101 years of age and older), is one component of a comprehensive surveillance program. It is not intended to diagnose infection nor to guide or monitor treatment. Performed at [facility], [address], [place], [place] [address]     Radiology Report No results found.    Signature  -   Brayton Lye M.D on 04/10/2024 at 2:03 PM   -  To page go to [email or web address]   "

## 2024-04-10 NOTE — TOC Progression Note (Signed)
 Transition of Care [facility]) - Progression Note    Patient Details  Name: Gene Fletcher MRN: [id number] Date of Birth: March 10, 1951  [facility]) CM/SW Contact  Inocente GORMAN Kindle, LCSW Phone Number: 04/10/2024, 9:08 AM  Clinical Narrative:    CSW spoke with patient's daughter and provided update and SNF bed offers. Also discussed future potential needs with her. Will continue to follow medically.    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, SNF Pending bed offer               Expected Discharge Plan and Services In-house Referral: Clinical Social Work   Post Acute Care Choice: Skilled Nursing Facility Living arrangements for the past 2 months: Single Family Home                                       Social Drivers of Health (SDOH) Interventions SDOH Screenings   Food Insecurity: No Food Insecurity (04/03/2024)  Housing: Low Risk (04/03/2024)  Transportation Needs: No Transportation Needs (04/03/2024)  Utilities: Not At Risk (04/03/2024)  Depression (PHQ2-9): Low Risk (11/07/2023)  Social Connections: Socially Integrated (04/03/2024)  Tobacco Use: Low Risk (04/05/2024)    Readmission Risk Interventions     No data to display

## 2024-04-10 NOTE — Progress Notes (Signed)
 OT Cancellation Note  Patient Details Name: Gene Fletcher MRN: [id number] DOB: [birth year]   Cancelled Treatment:    Reason Eval/Treat Not Completed: Other (comment). Hgb 5.8 this date. MD requesting therapies hold this date. OT to follow-up as appropriate.   Maurilio LITTIE, OTR/LSABRA  [facility] Acute Rehabilitation  Office: [phone]   Maurilio PARAS Dema Timmons 04/10/2024, 11:23 AM

## 2024-04-10 NOTE — Inpatient Diabetes Management (Signed)
 Inpatient Diabetes Program Recommendations  AACE/ADA: New Consensus Statement on Inpatient Glycemic Control (2015)  Target Ranges:  Prepandial:   less than 140 mg/dL      Peak postprandial:   less than 180 mg/dL (1-2 hours)      Critically ill patients:  140 - 180 mg/dL   Lab Results  Component Value Date   GLUCAP 158 (H) 04/10/2024   HGBA1C 9.2 (H) 04/03/2024    Review of Glycemic Control  Latest Reference Range & Units 04/09/24 16:24 04/09/24 21:27 04/10/24 08:35  Glucose-Capillary 70 - 99 mg/dL 780 (H) 848 (H) 841 (H)   Diabetes history: DM 2 Outpatient Diabetes medications:  Novolog  70/30- 25 units q AM and 20 units q PM Current orders for Inpatient glycemic control:  Novolog  0-9 units tid with meals Lantus  12 units q HS  Inpatient Diabetes Program Recommendations:    May consider adding meal coverage- Novolog  3 units tid with meals (hold if patient eats less than 50% or NPO).   Thanks,  Randall Bullocks, RN, BC-ADM Inpatient Diabetes Coordinator Pager [phone]  (8a-5p)

## 2024-04-11 DIAGNOSIS — L089 Local infection of the skin and subcutaneous tissue, unspecified: Secondary | ICD-10-CM | POA: Diagnosis not present

## 2024-04-11 DIAGNOSIS — E11628 Type 2 diabetes mellitus with other skin complications: Secondary | ICD-10-CM | POA: Diagnosis not present

## 2024-04-11 LAB — GLUCOSE, CAPILLARY
Glucose-Capillary: 150 mg/dL — ABNORMAL HIGH (ref 70–99)
Glucose-Capillary: 212 mg/dL — ABNORMAL HIGH (ref 70–99)
Glucose-Capillary: 212 mg/dL — ABNORMAL HIGH (ref 70–99)
Glucose-Capillary: 157 mg/dL — ABNORMAL HIGH (ref 70–99)

## 2024-04-11 LAB — HEMOGLOBIN AND HEMATOCRIT, BLOOD
HCT: 16.7 % — ABNORMAL LOW (ref 39.0–52.0)
Hemoglobin: 5.3 g/dL — CL (ref 13.0–17.0)

## 2024-04-11 NOTE — Progress Notes (Signed)
 PT Cancellation Note  Patient Details Name: Gene Fletcher MRN: [id number] DOB: Feb 06, 1951   Cancelled Treatment:    Reason Eval/Treat Not Completed: (P) Medical issues which prohibited therapy. Therapy services advised to hold per MD yesterday due to Hgb being 5.8. Today Hgb is 5.3 so PT will continue to hold. Will continue to monitor and follow up as appropriate.   Sabra Morel, PT, DPT  Acute Rehabilitation Services         Office: [phone]      Sabra MARLA Morel 04/11/2024, 8:48 AM

## 2024-04-11 NOTE — Progress Notes (Signed)
 "                                                                                                                                                                                                                                                                                PROGRESS NOTE     Patient Demographics:    Gene Fletcher, is a 74 y.o. male, DOB - [birth year], FMW:[id number]  Outpatient Primary MD for the patient is [name], MD    LOS - 8  Admit date - 04/03/2024    Chief Complaint  Patient presents with   Foot Pain       Brief Narrative (HPI from H&P)     74 y.o. male with a history of hypertension, hyperlipidemia, diabetes mellitus type 2, CKD stage IV, lung cancer s/p radiation, chronic anemia.  Patient presented secondary to a left foot wound with evidence of infection and dry gangrene. Empiric antibiotics started. 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  Patient has stabilized on antibiotics, but patient remains with significant anemia.   Subjective:   Patient in bed, denies fever, chills, he does report fatigue and weakness.      Assessment  & Plan :   Diabetic foot infection, wet gangrene of the right foot  - Patient with wet gangrene of the second, third, fourth and fifth digits of right foot. Empiric Vancomycin  and Cefepime  started for treatment of associated infection, antibiotics  stopped 04/08/2024.  Seen by Dr. 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 Harden on 04/05/2024 he has moderate disease but sufficient for post amputation healing per Dr. Harden.     Anemia Chronic blood loss anemia Anemia of acute illness Anemia of chronic blood loss -Multifactorial anemia, due to anemia of chronic kidney disease, anemia of chronic blood loss as well in the setting of recent surgery. 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  I have  discussed with him, and his daughter and explained the risks at this point with his anemia 5.8 at this point, he have maximized all nontransfusion measures at this point, received IV  iron  (despite high IV ferritin level), he is on Aranesp , did maximize dose to 200 mcg weekly given extra dose 1/21, and he is already on folic acid , and B12 supplements. -Minimize blood draws as well.  As discussed with the daughter, she still wants to know his hemoglobin, so we will do hemoglobin daily via pediatric tube. - Hold aspirin , continue with SCD for DVT prophylaxis   DKA patient with DM type II - Likely precipitated by foot gangrene, initially was on insulin  drip, now transition to Lantus  insulin  along with SSI, was taking 7030 at home, diabetic education provided.  Monitor and adjust.  DKA has resolved.    Lab Results  Component Value Date   HGBA1C 9.2 (H) 04/03/2024   CBG (last 3)  Recent Labs    04/10/24 1732 04/10/24 2119 04/11/24 0800  GLUCAP 138* 133* 150*     Leukocytosis   -Secondary to foot infection, resolved completely, off antibiotics 04/08/2024   Primary hypertension  - blood pressure low due to anemia on 04/06/2024, blood pressure medications held, as needed hydralazine  only, midodrine  added for now.   History of lung cancer with nonspecific right-sided chest x-ray changes.  Follow-up with PCP and primary oncologist postdischarge.   Memory impairment  -Concern from family. Recommend outpatient neuropsych testing/evaluation.   Mild hypernatremia, CKD stage IV, hypokalemia - Baseline creatinine appears to be around 2.2 to 2.4.  Potassium replaced.  Renal function stable to improving.   Moderate bilateral PAD.  Glycemic control, check lipid panel tomorrow, will place on aspirin , statin if needed, outpatient follow-up with vascular surgery.  Urinary retention.  On Flomax , In-N-Out catheter, if retention becomes recurrent then Foley.       Condition -   Guarded  Family  Communication  : discussed with daughter at bedside daily  Code Status : Full code   Consults  : Dr. Harden  PUD Prophylaxis : PPI   Procedures  :     ABI.  Moderate bilateral disease discussed with Dr. Harden.      Disposition Plan  :    Status is: Inpatient   DVT Prophylaxis  :    SCD's Start: 04/05/24 1611 SCD's Start: 04/05/24 1611 SCDs Start: 04/03/24 1433    Lab Results  Component Value Date   PLT 235 04/10/2024    Diet :  Diet Order             Diet Carb Modified Room service appropriate? No  Diet effective now                    Inpatient Medications  Scheduled Meds:  acidophilus  1 capsule Oral Daily   vitamin C   1,000 mg Oral Daily   atropine   1 drop Both Eyes Daily   darbepoetin (ARANESP ) injection - NON-DIALYSIS  200 mcg Subcutaneous Q Wed-1800   docusate sodium   200 mg Oral BID   ferrous sulfate   325 mg Oral TID WC   folic acid   1 mg Oral Daily   insulin  aspart  0-9 Units Subcutaneous TID WC   insulin  glargine  12 Units Subcutaneous QHS   midodrine   10 mg Oral TID WC   nutrition supplement (JUVEN)  1 packet Oral BID BM   nystatin   5 mL Mouth/Throat QID   pantoprazole   40 mg Oral BID   prednisoLONE  acetate  1 drop Both Eyes QID   tamsulosin   0.4 mg Oral Daily   zinc  sulfate (50mg  elemental zinc )  220 mg Oral  Daily   Continuous Infusions:   PRN Meds:.acetaminophen , chlorproMAZINE  (THORAZINE ) injection, dextrose , hydrALAZINE , HYDROmorphone  (DILAUDID ) injection, labetalol , metoCLOPramide  (REGLAN ) injection, ondansetron  (ZOFRAN ) IV, oxyCODONE , phenol   Objective:   Vitals:   04/11/24 0000 04/11/24 0200 04/11/24 0400 04/11/24 0801  BP: 115/70  131/70   Pulse: (!) 101 86 87   Resp: 20 (!) 0 18   Temp: 98 F (36.7 C)  97.9 F (36.6 C) 98.7 F (37.1 C)  TempSrc: Axillary  Axillary Oral  SpO2: 100% 100% 100%   Weight:      Height:        Wt Readings from Last 3 Encounters:  04/05/24 65.8 kg  04/02/24 65.8 kg  03/26/24 69.9 kg      Intake/Output Summary (Last 24 hours) at 04/11/2024 1054 Last data filed at 04/11/2024 0500 Gross per 24 hour  Intake --  Output 0 ml  Net 0 ml     Physical Exam  Awake Alert, extremely frail, deconditioned Good air entry Regular rate and rhythms +ve B.Sounds, Abd Soft, No tenderness, No rebound - guarding or rigidity. Right BKA stump looks clean with wound VAC    Data Review:    Recent Labs  Lab 04/05/24 0434 04/06/24 0344 04/07/24 0526 04/09/24 0223 04/10/24 0308 04/11/24 0232  WBC 20.5* 10.0 9.8 7.9 11.3*  --   HGB 8.9* 6.6* 7.2* 6.0* 5.8* 5.3*  HCT 27.0* 20.1* 22.1* 19.0* 18.7* 16.7*  PLT 288 202 194 209 235  --   MCV 97.5 98.0 98.7 100.5* 100.0  --   MCH 32.1 32.2 32.1 31.7 31.0  --   MCHC 33.0 32.8 32.6 31.6 31.0  --   RDW 12.5 12.7 12.9 12.9 12.9  --   LYMPHSABS  --  0.3* 0.5* 0.3* 0.3*  --   MONOABS  --  0.6 0.6 0.6 0.8  --   EOSABS  --  0.0 0.0 0.0 0.1  --   BASOSABS  --  0.0 0.0 0.0 0.0  --     Recent Labs  Lab 04/05/24 0434 04/06/24 0344 04/07/24 0526 04/09/24 0223  NA 146* 140 141 137  K 4.0 3.6 3.4* 4.0  CL 110 109 108 107  CO2 25 23 24 22   ANIONGAP 11 9 9 8   GLUCOSE 202* 226* 107* 186*  BUN 47* 52* 60* 66*  CREATININE 2.00* 2.27* 2.45* 2.35*  MG  --  2.3 2.2  --   CALCIUM 9.4 8.1* 8.3* 8.3*      Recent Labs  Lab 04/05/24 0434 04/06/24 0344 04/07/24 0526 04/09/24 0223  MG  --  2.3 2.2  --   CALCIUM 9.4 8.1* 8.3* 8.3*    --------------------------------------------------------------------------------------------------------------- Lab Results  Component Value Date   CHOL 108 04/06/2024   HDL 35 (L) 04/06/2024   LDLCALC 62 04/06/2024   TRIG 59 04/06/2024   CHOLHDL 3.1 04/06/2024    Lab Results  Component Value Date   HGBA1C 9.2 (H) 04/03/2024   No results for input(s): TSH, T4TOTAL, FREET4, T3FREE, THYROIDAB in the last 72 hours. Recent Labs    04/09/24 0613  VITAMINB12 880  FOLATE 9.9  FERRITIN  1,976*  TIBC 139*  IRON  114  RETICCTPCT 2.4   ------------------------------------------------------------------------------------------------------------------ Cardiac Enzymes No results for input(s): CKMB, TROPONINI, MYOGLOBIN in the last 168 hours.  Invalid input(s): CK  Micro Results Recent Results (from the past 240 hours)  Surgical pcr screen     Status: None   Collection Time: 04/04/24 10:39 AM   Specimen: Nasal  Mucosa; Nasal Swab  Result Value Ref Range Status   MRSA, PCR NEGATIVE NEGATIVE Final   Staphylococcus aureus NEGATIVE NEGATIVE Final    Comment: (NOTE) The Xpert SA Assay (FDA approved for NASAL specimens in patients 61 years of age and older), is one component of a comprehensive surveillance program. It is not intended to diagnose infection nor to guide or monitor treatment. Performed at [facility], [address] [place], [place] [address]     Radiology Report No results found.    Signature  -   Brayton Lye M.D on 04/11/2024 at 10:54 AM   -  To page go to [email or web address]   "

## 2024-04-11 NOTE — Plan of Care (Signed)

## 2024-04-12 ENCOUNTER — Inpatient Hospital Stay (HOSPITAL_COMMUNITY)

## 2024-04-12 DIAGNOSIS — L089 Local infection of the skin and subcutaneous tissue, unspecified: Secondary | ICD-10-CM | POA: Diagnosis not present

## 2024-04-12 DIAGNOSIS — E11628 Type 2 diabetes mellitus with other skin complications: Secondary | ICD-10-CM | POA: Diagnosis not present

## 2024-04-12 LAB — GLUCOSE, CAPILLARY
Glucose-Capillary: 168 mg/dL — ABNORMAL HIGH (ref 70–99)
Glucose-Capillary: 262 mg/dL — ABNORMAL HIGH (ref 70–99)
Glucose-Capillary: 245 mg/dL — ABNORMAL HIGH (ref 70–99)
Glucose-Capillary: 252 mg/dL — ABNORMAL HIGH (ref 70–99)

## 2024-04-12 LAB — HEMOGLOBIN AND HEMATOCRIT, BLOOD
HCT: 16.3 % — ABNORMAL LOW (ref 39.0–52.0)
Hemoglobin: 5.2 g/dL — CL (ref 13.0–17.0)

## 2024-04-12 NOTE — Progress Notes (Signed)
 PT Cancellation Note  Patient Details Name: Gene Fletcher MRN: [id number] DOB: 1950-03-25   Cancelled Treatment:    Reason Eval/Treat Not Completed: (P) Medical issues which prohibited therapy. Pt with ongoing decrease in hemoglobin levels (5.2), not safe to participate in therapy services at this time. Will continue to follow and coordinate with providers regarding when appropriate to return to treatment.   Sabra Morel, PT, DPT  Acute Rehabilitation Services         Office: [phone]      Sabra MARLA Morel 04/12/2024, 12:07 PM

## 2024-04-12 NOTE — Progress Notes (Signed)
 "                                                                                                                                                                                                                                                                                PROGRESS NOTE     Patient Demographics:    Gene Fletcher, is a 74 y.o. male, DOB - April 18, 1950, FMW:[id number]  Outpatient Primary MD for the patient is [name], MD    LOS - 9  Admit date - 04/03/2024    Chief Complaint  Patient presents with   Foot Pain       Brief Narrative (HPI from H&P)     74 y.o. male with a history of hypertension, hyperlipidemia, diabetes mellitus type 2, CKD stage IV, lung cancer s/p radiation, chronic anemia.  Patient presented secondary to a left foot wound with evidence of infection and dry gangrene. Empiric antibiotics started. 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  Patient has stabilized on antibiotics, but patient remains with significant anemia.   Subjective:   Patient in bed, denies any fever or chills, had good breakfast this morning as discussed with brother at bedside but he had vomiting after that.  He will having hiccups.    Assessment  & Plan :   Diabetic foot infection, wet gangrene of the right foot  - Patient with wet gangrene of the second, third, fourth and fifth digits of right foot. Empiric Vancomycin  and Cefepime  started for treatment of associated infection, antibiotics  stopped 04/08/2024.  Seen by Dr. 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 Harden on 04/05/2024 he has moderate disease but sufficient for post amputation healing per Dr. Harden.     Anemia Chronic blood loss anemia Anemia of acute illness Anemia of chronic blood loss -Multifactorial anemia, due to anemia of chronic kidney disease, anemia of chronic blood loss as well in the setting of recent  surgery. - Patient is Jehovah's Witness, does not want any blood product transfusion-.  I have discussed with him, and his daughter and explained the risks at this point with his  anemia 5.8 at this point, he have maximized all nontransfusion measures at this point, received IV iron  (despite high IV ferritin level), he is on Aranesp , did maximize dose to 200 mcg weekly given extra dose 1/21, and he is already on folic acid , and B12 supplements. -Minimize blood draws as well.  As discussed with the daughter, she still wants to know his hemoglobin, so we will do hemoglobin daily via pediatric tube. - Hold aspirin , continue with SCD for DVT prophylaxis -Globin is lower at 5.2 today, I have explained to her brother at bedside that we maximized all current interventions including highest dose Aranesp , IV iron , p.o. iron , B12 and folic acid  supplements.  Vomiting -Had vomiting this morning, but exam is benign, will obtain KUB.   DKA patient with DM type II - Likely precipitated by foot gangrene, initially was on insulin  drip, now transition to Lantus  insulin  along with SSI, was taking 7030 at home, diabetic education provided.  Monitor and adjust.  DKA has resolved.    Lab Results  Component Value Date   HGBA1C 9.2 (H) 04/03/2024   CBG (last 3)  Recent Labs    04/11/24 2119 04/12/24 0743 04/12/24 1143  GLUCAP 157* 168* 262*     Leukocytosis   -Secondary to foot infection, resolved completely, off antibiotics 04/08/2024   Primary hypertension  - blood pressure low due to anemia on 04/06/2024, blood pressure medications held, as needed hydralazine  only, midodrine  added for now.   History of lung cancer with nonspecific right-sided chest x-ray changes.  Follow-up with PCP and primary oncologist postdischarge.   Memory impairment  -Concern from family. Recommend outpatient neuropsych testing/evaluation.   Mild hypernatremia, CKD stage IV, hypokalemia - Baseline creatinine appears to be around  2.2 to 2.4.  Potassium replaced.  Renal function stable to improving.   Moderate bilateral PAD.  Glycemic control, check lipid panel tomorrow, will place on aspirin , statin if needed, outpatient follow-up with vascular surgery.  Urinary retention.  On Flomax , In-N-Out catheter, if retention becomes recurrent then Foley.       Condition -   Guarded  Family Communication  : discussed with brother at bedside daily  Code Status : Full code   Consults  : Dr. Harden  PUD Prophylaxis : PPI   Procedures  :     ABI.  Moderate bilateral disease discussed with Dr. Harden.      Disposition Plan  :    Status is: Inpatient   DVT Prophylaxis  :    SCD's Start: 04/05/24 1611 SCD's Start: 04/05/24 1611 SCDs Start: 04/03/24 1433    Lab Results  Component Value Date   PLT 235 04/10/2024    Diet :  Diet Order             Diet Carb Modified Room service appropriate? No  Diet effective now                    Inpatient Medications  Scheduled Meds:  acidophilus  1 capsule Oral Daily   vitamin C   1,000 mg Oral Daily   atropine   1 drop Both Eyes Daily   darbepoetin (ARANESP ) injection - NON-DIALYSIS  200 mcg Subcutaneous Q Wed-1800   docusate sodium   200 mg Oral BID   ferrous sulfate   325 mg Oral TID WC   folic acid   1 mg Oral Daily   insulin  aspart  0-9 Units Subcutaneous TID WC   insulin  glargine  12 Units Subcutaneous QHS   midodrine   10 mg Oral TID WC   nutrition supplement (JUVEN)  1 packet Oral BID BM   nystatin   5 mL Mouth/Throat QID   pantoprazole   40 mg Oral BID   prednisoLONE  acetate  1 drop Both Eyes QID   tamsulosin   0.4 mg Oral Daily   zinc  sulfate (50mg  elemental zinc )  220 mg Oral Daily   Continuous Infusions:   PRN Meds:.acetaminophen , chlorproMAZINE  (THORAZINE ) injection, dextrose , hydrALAZINE , HYDROmorphone  (DILAUDID ) injection, labetalol , metoCLOPramide  (REGLAN ) injection, ondansetron  (ZOFRAN ) IV, oxyCODONE , phenol   Objective:   Vitals:    04/11/24 2319 04/12/24 0320 04/12/24 0745 04/12/24 1139  BP: (!) 141/69 134/72 115/62 130/67  Pulse: 87 86    Resp: 20 20    Temp: 99.3 F (37.4 C) 98.8 F (37.1 C) 98.6 F (37 C) 98.5 F (36.9 C)  TempSrc: Oral Oral Oral Oral  SpO2: 98% 98%    Weight:      Height:        Wt Readings from Last 3 Encounters:  04/05/24 65.8 kg  04/02/24 65.8 kg  03/26/24 69.9 kg     Intake/Output Summary (Last 24 hours) at 04/12/2024 1231 Last data filed at 04/12/2024 0600 Gross per 24 hour  Intake --  Output 1700 ml  Net -1700 ml     Physical Exam  Awake Alert, extremely frail, deconditioned Good air entry Regular rate and rhythms +ve B.Sounds, Abd Soft, No tenderness, No rebound - guarding or rigidity. Right BKA stump looks clean with wound VAC, left foot with SCDs    Data Review:    Recent Labs  Lab 04/06/24 0344 04/07/24 0526 04/09/24 0223 04/10/24 0308 04/11/24 0232 04/12/24 0744  WBC 10.0 9.8 7.9 11.3*  --   --   HGB 6.6* 7.2* 6.0* 5.8* 5.3* 5.2*  HCT 20.1* 22.1* 19.0* 18.7* 16.7* 16.3*  PLT 202 194 209 235  --   --   MCV 98.0 98.7 100.5* 100.0  --   --   MCH 32.2 32.1 31.7 31.0  --   --   MCHC 32.8 32.6 31.6 31.0  --   --   RDW 12.7 12.9 12.9 12.9  --   --   LYMPHSABS 0.3* 0.5* 0.3* 0.3*  --   --   MONOABS 0.6 0.6 0.6 0.8  --   --   EOSABS 0.0 0.0 0.0 0.1  --   --   BASOSABS 0.0 0.0 0.0 0.0  --   --     Recent Labs  Lab 04/06/24 0344 04/07/24 0526 04/09/24 0223  NA 140 141 137  K 3.6 3.4* 4.0  CL 109 108 107  CO2 23 24 22   ANIONGAP 9 9 8   GLUCOSE 226* 107* 186*  BUN 52* 60* 66*  CREATININE 2.27* 2.45* 2.35*  MG 2.3 2.2  --   CALCIUM 8.1* 8.3* 8.3*      Recent Labs  Lab 04/06/24 0344 04/07/24 0526 04/09/24 0223  MG 2.3 2.2  --   CALCIUM 8.1* 8.3* 8.3*    --------------------------------------------------------------------------------------------------------------- Lab Results  Component Value Date   CHOL 108 04/06/2024   HDL 35 (L)  04/06/2024   LDLCALC 62 04/06/2024   TRIG 59 04/06/2024   CHOLHDL 3.1 04/06/2024    Lab Results  Component Value Date   HGBA1C 9.2 (H) 04/03/2024   No results for input(s): TSH, T4TOTAL, FREET4, T3FREE, THYROIDAB in the last 72 hours. No results for input(s): VITAMINB12, FOLATE, FERRITIN, TIBC, IRON , RETICCTPCT in the last 72 hours.  ------------------------------------------------------------------------------------------------------------------ Cardiac  Enzymes No results for input(s): CKMB, TROPONINI, MYOGLOBIN in the last 168 hours.  Invalid input(s): CK  Micro Results Recent Results (from the past 240 hours)  Surgical pcr screen     Status: None   Collection Time: 04/04/24 10:39 AM   Specimen: Nasal Mucosa; Nasal Swab  Result Value Ref Range Status   MRSA, PCR NEGATIVE NEGATIVE Final   Staphylococcus aureus NEGATIVE NEGATIVE Final    Comment: (NOTE) The Xpert SA Assay (FDA approved for NASAL specimens in patients 29 years of age and older), is one component of a comprehensive surveillance program. It is not intended to diagnose infection nor to guide or monitor treatment. Performed at [facility], [address] [place], [place] [address]     Radiology Report No results found.    Signature  -   Brayton Lye M.D on 04/12/2024 at 12:31 PM   -  To page go to [email or web address]   "

## 2024-04-12 NOTE — TOC Progression Note (Signed)
 Transition of Care [facility]) - Progression Note    Patient Details  Name: Gene Fletcher MRN: [id number] Date of Birth: November 02, 1950  [facility] CM/SW Contact  Inocente GORMAN Kindle, LCSW Phone Number: 04/12/2024, 8:39 AM  Clinical Narrative:    CSW continuing to follow for medical stability.    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, SNF Pending bed offer               Expected Discharge Plan and Services In-house Referral: Clinical Social Work   Post Acute Care Choice: Skilled Nursing Facility Living arrangements for the past 2 months: Single Family Home                                       Social Drivers of Health (SDOH) Interventions SDOH Screenings   Food Insecurity: No Food Insecurity (04/03/2024)  Housing: Low Risk (04/03/2024)  Transportation Needs: No Transportation Needs (04/03/2024)  Utilities: Not At Risk (04/03/2024)  Depression (PHQ2-9): Low Risk (11/07/2023)  Social Connections: Socially Integrated (04/03/2024)  Tobacco Use: Low Risk (04/05/2024)    Readmission Risk Interventions     No data to display

## 2024-04-12 NOTE — Progress Notes (Addendum)
 OT Cancellation Note  Patient Details Name: JERIS ROSER MRN: [id number] DOB: July 11, 1950   Cancelled Treatment:    Reason Eval/Treat Not Completed: Medical issues which prohibited therapy.  HGB continues to trend down, now 5.2.  Spoke with MD, advised to continue therapy hold.    Shariece Viveiros D Hansika Leaming 04/12/2024, 10:02 AM 04/12/2024  RP, OTR/L  Acute Rehabilitation Services  Office:  [phone]

## 2024-04-13 DIAGNOSIS — L089 Local infection of the skin and subcutaneous tissue, unspecified: Secondary | ICD-10-CM | POA: Diagnosis not present

## 2024-04-13 DIAGNOSIS — E11628 Type 2 diabetes mellitus with other skin complications: Secondary | ICD-10-CM | POA: Diagnosis not present

## 2024-04-13 LAB — GLUCOSE, CAPILLARY
Glucose-Capillary: 247 mg/dL — ABNORMAL HIGH (ref 70–99)
Glucose-Capillary: 272 mg/dL — ABNORMAL HIGH (ref 70–99)
Glucose-Capillary: 192 mg/dL — ABNORMAL HIGH (ref 70–99)
Glucose-Capillary: 198 mg/dL — ABNORMAL HIGH (ref 70–99)

## 2024-04-13 LAB — HEMOGLOBIN AND HEMATOCRIT, BLOOD
HCT: 18.8 % — ABNORMAL LOW (ref 39.0–52.0)
Hemoglobin: 5.9 g/dL — CL (ref 13.0–17.0)

## 2024-04-13 MED ORDER — GLUCERNA SHAKE PO LIQD
237.0000 mL | Freq: Three times a day (TID) | ORAL | Status: DC
  Administered 2024-04-13 – 2024-04-15 (×7): 237 mL via ORAL

## 2024-04-13 MED ORDER — INSULIN GLARGINE 100 UNIT/ML ~~LOC~~ SOLN
16.0000 [IU] | Freq: Every day | SUBCUTANEOUS | Status: DC
  Administered 2024-04-13 – 2024-04-14 (×2): 16 [IU] via SUBCUTANEOUS
  Filled 2024-04-13 (×3): qty 0.16

## 2024-04-13 MED ORDER — FERROUS SULFATE 325 (65 FE) MG PO TABS
325.0000 mg | ORAL_TABLET | Freq: Every day | ORAL | Status: DC
  Administered 2024-04-14 – 2024-04-15 (×2): 325 mg via ORAL
  Filled 2024-04-13 (×2): qty 1

## 2024-04-13 NOTE — Plan of Care (Signed)

## 2024-04-13 NOTE — Plan of Care (Signed)
" °  Problem: Health Behavior/Discharge Planning: Goal: Ability to identify and utilize available resources and services will improve Outcome: Progressing   Problem: Metabolic: Goal: Ability to maintain appropriate glucose levels will improve Outcome: Progressing   Problem: Skin Integrity: Goal: Risk for impaired skin integrity will decrease Outcome: Progressing   Problem: Tissue Perfusion: Goal: Adequacy of tissue perfusion will improve Outcome: Progressing   Problem: Clinical Measurements: Goal: Ability to maintain clinical measurements within normal limits will improve Outcome: Progressing   Problem: Pain Managment: Goal: General experience of comfort will improve and/or be controlled Outcome: Progressing   Problem: Safety: Goal: Ability to remain free from injury will improve Outcome: Progressing   Problem: Nutritional: Goal: Maintenance of adequate nutrition will improve Outcome: Not Progressing   "

## 2024-04-13 NOTE — Progress Notes (Signed)
 Patient ID: Gene Fletcher, male   DOB: 1950/11/17, 74 y.o.   MRN: [id number] Patient has a stable transtibial amputation.  Will plan to discontinue the wound VAC at time of discharge.  No drainage in the wound VAC at this time.

## 2024-04-13 NOTE — Progress Notes (Signed)
 "                                                                                                                                                                                                                                                                                PROGRESS NOTE     Patient Demographics:    Gene Fletcher, is a 74 y.o. male, DOB - Aug 18, 1950, FMW:[id number]  Outpatient Primary MD for the patient is [name], MD    LOS - 10  Admit date - 04/03/2024    Chief Complaint  Patient presents with   Foot Pain       Brief Narrative (HPI from H&P)     74 y.o. male with a history of hypertension, hyperlipidemia, diabetes mellitus type 2, CKD stage IV, lung cancer s/p radiation, chronic anemia.  Patient presented secondary to a left foot wound with evidence of infection and dry gangrene. Empiric antibiotics started. 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  Patient has stabilized on antibiotics, but patient remains with significant anemia.   Subjective:   Overall oral intake has improved, he drank 3 Glucerna's yesterday, he is with some nausea and vomiting.   Assessment  & Plan :   Diabetic foot infection, wet gangrene of the right foot  - Patient with wet gangrene of the second, third, fourth and fifth digits of right foot.  - Orthopedic input Dr. 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 -Treated with broad-spectrum antibiotic, stop date 04/08/2024  Anemia Chronic blood loss anemia Anemia of acute illness Anemia of chronic blood loss -Multifactorial anemia, due to anemia of chronic kidney disease, anemia of chronic blood loss as well in the setting of recent surgery. 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 iron , B12 and folic acid . - Minimize blood draws. - Hold aspirin , continue with SCDs  for DVT prophylaxis, no pharmacologic prophylaxis. - On p.o. iron .  (He is nauseous so I will decrease to once daily given high ferritin level, and iron  is best absorbed when given once daily versus  3 times daily as I discussed with pharmacy)   Nausea/vomiting -. Patient with significant nausea/vomiting, as discussed with patient/brother at bedside, this has been ongoing even before admission to the hospital, so it is very likely related to his uremia, for now we will continue with as needed nausea meds.   -Continue with as needed nausea meds for now   DKA patient with DM type II  -DKA likely precipitated by foot gangrene,. -initially was on insulin  drip, currently transitioned to subcu insulin , CBG elevated most likely due to increased Glucerna intake, so we will increase ,will increase Semglee  to 16 units  Lab Results  Component Value Date   HGBA1C 9.2 (H) 04/03/2024   CBG (last 3)  Recent Labs    04/12/24 1613 04/12/24 2011 04/13/24 0805  GLUCAP 245* 252* 247*     Leukocytosis   -Secondary to foot infection, resolved completely, off antibiotics 04/08/2024   Primary hypertension  - blood pressure low due to anemia on 04/06/2024, blood pressure medications held, as needed hydralazine  only, midodrine  added for now.   History of lung cancer with nonspecific right-sided chest x-ray changes.  Follow-up with PCP and primary oncologist postdischarge.   Memory impairment  -Concern from family. Recommend outpatient neuropsych testing/evaluation.   Mild hypernatremia, CKD stage IV, hypokalemia - Baseline creatinine appears to be around 2.2 to 2.4.  Potassium replaced.  Renal function stable to improving.   Moderate bilateral PAD.  Glycemic control, check lipid panel tomorrow, will place on aspirin , statin if needed, outpatient follow-up with vascular surgery.  Urinary retention.  On Flomax , In-N-Out catheter, if retention becomes recurrent then Foley.       Condition -    Guarded  Family Communication  : discussed with brother at bedside daily  Code Status : Full code   Consults  : Dr. Harden  PUD Prophylaxis : PPI   Procedures  :     ABI.  Moderate bilateral disease discussed with Dr. Harden.      Disposition Plan  :    Status is: Inpatient   DVT Prophylaxis  :    SCD's Start: 04/05/24 1611 SCD's Start: 04/05/24 1611 SCDs Start: 04/03/24 1433    Lab Results  Component Value Date   PLT 235 04/10/2024    Diet :  Diet Order             Diet Carb Modified Room service appropriate? No  Diet effective now                    Inpatient Medications  Scheduled Meds:  acidophilus  1 capsule Oral Daily   vitamin C   1,000 mg Oral Daily   atropine   1 drop Both Eyes Daily   darbepoetin (ARANESP ) injection - NON-DIALYSIS  200 mcg Subcutaneous Q Wed-1800   docusate sodium   200 mg Oral BID   feeding supplement (GLUCERNA SHAKE)  237 mL Oral TID BM   ferrous sulfate   325 mg Oral TID WC   folic acid   1 mg Oral Daily   insulin  aspart  0-9 Units Subcutaneous TID WC   insulin  glargine  12 Units Subcutaneous QHS   midodrine   10 mg Oral TID WC   nutrition supplement (JUVEN)  1 packet Oral BID BM   nystatin   5 mL Mouth/Throat QID   pantoprazole   40 mg Oral BID   prednisoLONE  acetate  1 drop Both Eyes QID   tamsulosin   0.4 mg Oral Daily   zinc  sulfate (50mg  elemental  zinc )  220 mg Oral Daily   Continuous Infusions:   PRN Meds:.acetaminophen , chlorproMAZINE  (THORAZINE ) injection, dextrose , hydrALAZINE , HYDROmorphone  (DILAUDID ) injection, labetalol , metoCLOPramide  (REGLAN ) injection, ondansetron  (ZOFRAN ) IV, oxyCODONE , phenol   Objective:   Vitals:   04/12/24 1924 04/12/24 2325 04/13/24 0346 04/13/24 0804  BP: 136/69 126/68 134/72 113/61  Pulse: (!) 101 89 94   Resp: 16 19 18    Temp: 98.9 F (37.2 C) 99 F (37.2 C) 98.7 F (37.1 C) 99 F (37.2 C)  TempSrc: Oral Oral Oral Oral  SpO2: 97% 100% 100%   Weight:      Height:         Wt Readings from Last 3 Encounters:  04/05/24 65.8 kg  04/02/24 65.8 kg  03/26/24 69.9 kg     Intake/Output Summary (Last 24 hours) at 04/13/2024 1105 Last data filed at 04/13/2024 0600 Gross per 24 hour  Intake 240 ml  Output 2700 ml  Net -2460 ml     Physical Exam  Awake, alert, extremely frail, deconditioned, Good air entry bilaterally Abdomen soft Regular rate and rhythm Right BKA with wound VAC, left foot with no gangrene or cyanosis, connected to SCDs.   Data Review:    Recent Labs  Lab 04/07/24 0526 04/09/24 0223 04/10/24 0308 04/11/24 0232 04/12/24 0744 04/13/24 0547  WBC 9.8 7.9 11.3*  --   --   --   HGB 7.2* 6.0* 5.8* 5.3* 5.2* 5.9*  HCT 22.1* 19.0* 18.7* 16.7* 16.3* 18.8*  PLT 194 209 235  --   --   --   MCV 98.7 100.5* 100.0  --   --   --   MCH 32.1 31.7 31.0  --   --   --   MCHC 32.6 31.6 31.0  --   --   --   RDW 12.9 12.9 12.9  --   --   --   LYMPHSABS 0.5* 0.3* 0.3*  --   --   --   MONOABS 0.6 0.6 0.8  --   --   --   EOSABS 0.0 0.0 0.1  --   --   --   BASOSABS 0.0 0.0 0.0  --   --   --     Recent Labs  Lab 04/07/24 0526 04/09/24 0223  NA 141 137  K 3.4* 4.0  CL 108 107  CO2 24 22  ANIONGAP 9 8  GLUCOSE 107* 186*  BUN 60* 66*  CREATININE 2.45* 2.35*  MG 2.2  --   CALCIUM 8.3* 8.3*      Recent Labs  Lab 04/07/24 0526 04/09/24 0223  MG 2.2  --   CALCIUM 8.3* 8.3*    --------------------------------------------------------------------------------------------------------------- Lab Results  Component Value Date   CHOL 108 04/06/2024   HDL 35 (L) 04/06/2024   LDLCALC 62 04/06/2024   TRIG 59 04/06/2024   CHOLHDL 3.1 04/06/2024    Lab Results  Component Value Date   HGBA1C 9.2 (H) 04/03/2024   No results for input(s): TSH, T4TOTAL, FREET4, T3FREE, THYROIDAB in the last 72 hours. No results for input(s): VITAMINB12, FOLATE, FERRITIN, TIBC, IRON , RETICCTPCT in the last 72  hours.  ------------------------------------------------------------------------------------------------------------------ Cardiac Enzymes No results for input(s): CKMB, TROPONINI, MYOGLOBIN in the last 168 hours.  Invalid input(s): CK  Micro Results Recent Results (from the past 240 hours)  Surgical pcr screen     Status: None   Collection Time: 04/04/24 10:39 AM   Specimen: Nasal Mucosa; Nasal Swab  Result Value Ref Range Status  MRSA, PCR NEGATIVE NEGATIVE Final   Staphylococcus aureus NEGATIVE NEGATIVE Final    Comment: (NOTE) The Xpert SA Assay (FDA approved for NASAL specimens in patients 69 years of age and older), is one component of a comprehensive surveillance program. It is not intended to diagnose infection nor to guide or monitor treatment. Performed at [facility], [address], [place], [place] [address]     Radiology Report DG Abd Portable 1V Result Date: 04/12/2024 CLINICAL DATA:  Vomiting EXAM: PORTABLE ABDOMEN - 1 VIEW COMPARISON:  04/05/2024 FINDINGS: 2 supine views of the abdomen and pelvis. No gaseous distention of bowel loops. No gross free intraperitoneal air. Large amount of ascending colonic stool. No abnormal abdominal calcifications. No appendicolith. Mild right hemidiaphragm elevation. IMPRESSION: 1. No acute findings. 2. Possible constipation. Electronically Signed   By: Rockey Kilts M.D.   On: 04/12/2024 14:55      Signature  -   Brayton Lye M.D on 04/13/2024 at 11:05 AM   -  To page go to [email or web address]   "

## 2024-04-14 DIAGNOSIS — E11628 Type 2 diabetes mellitus with other skin complications: Secondary | ICD-10-CM | POA: Diagnosis not present

## 2024-04-14 DIAGNOSIS — L089 Local infection of the skin and subcutaneous tissue, unspecified: Secondary | ICD-10-CM | POA: Diagnosis not present

## 2024-04-14 LAB — HEMOGLOBIN AND HEMATOCRIT, BLOOD
HCT: 17.1 % — ABNORMAL LOW (ref 39.0–52.0)
Hemoglobin: 5.4 g/dL — CL (ref 13.0–17.0)

## 2024-04-14 LAB — GLUCOSE, CAPILLARY
Glucose-Capillary: 168 mg/dL — ABNORMAL HIGH (ref 70–99)
Glucose-Capillary: 158 mg/dL — ABNORMAL HIGH (ref 70–99)
Glucose-Capillary: 212 mg/dL — ABNORMAL HIGH (ref 70–99)
Glucose-Capillary: 143 mg/dL — ABNORMAL HIGH (ref 70–99)

## 2024-04-14 MED ORDER — POLYETHYLENE GLYCOL 3350 17 G PO PACK
17.0000 g | PACK | Freq: Two times a day (BID) | ORAL | Status: AC
  Administered 2024-04-14 – 2024-04-15 (×4): 17 g via ORAL
  Filled 2024-04-14 (×3): qty 1

## 2024-04-14 MED ORDER — HALOPERIDOL LACTATE 5 MG/ML IJ SOLN
5.0000 mg | Freq: Once | INTRAMUSCULAR | Status: AC
  Administered 2024-04-14: 5 mg via INTRAVENOUS
  Filled 2024-04-14: qty 1

## 2024-04-14 NOTE — Progress Notes (Signed)
 "                                                                                                                                                                                                                                                                                PROGRESS NOTE     Patient Demographics:    Gene Fletcher, is a 74 y.o. male, DOB - [birth year], FMW:[id number]  Outpatient Primary MD for the patient is [name], MD    LOS - 11  Admit date - 04/03/2024    Chief Complaint  Patient presents with   Foot Pain       Brief Narrative (HPI from H&P)     74 y.o. male with a history of hypertension, hyperlipidemia, diabetes mellitus type 2, CKD stage IV, lung cancer s/p radiation, chronic anemia.  Patient presented secondary to a left foot wound with evidence of infection and dry gangrene. Empiric antibiotics started. 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  Patient has stabilized on antibiotics, but patient remains with significant anemia.   Subjective:   Intake remains poor, he still with significant nausea and vomiting, he tolerating sitting in a chair a few hours yesterday.     Assessment  & Plan :   Diabetic foot infection, wet gangrene of the right foot  - Patient with wet gangrene of the second, third, fourth and fifth digits of right foot.  - Orthopedic input Dr. 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 -Treated with broad-spectrum antibiotic, stop date 04/08/2024  Anemia Chronic blood loss anemia Anemia of acute illness Anemia of chronic blood loss -Multifactorial anemia, due to anemia of chronic kidney disease, anemia of chronic blood loss as well in the setting of recent surgery. 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 iron , B12 and folic acid . - Minimize blood draws. - Hold aspirin ,  continue with SCDs for DVT prophylaxis, no pharmacologic prophylaxis. - On p.o. iron .   - Have discussed with patient/brother at bedside, patient is at increased risk for multiple complications from his profound  anemia including acute ischemic event including stroke or  limb ischemia or worsening renal function or heart disease, has been on bedrest, trying to minimize his activity, but were able to get him out of bed to chair over last couple days.    Nausea/vomiting Failure to thrive/poor appetite -. Patient with significant nausea/vomiting, as discussed with patient/brother at bedside, this has been ongoing even before admission to the hospital, so it is very likely related to his uremia, for now we will continue with as needed nausea meds.   -Continue with as needed nausea meds for now -Abdominal x-ray with no acute finding, concerning for some mild constipation, started on MiraLAX .   DKA patient with DM type II  -DKA likely precipitated by foot gangrene,. -initially was on insulin  drip, currently transitioned to subcu insulin , CBG appears better improved on Semglee  16 units.    Lab Results  Component Value Date   HGBA1C 9.2 (H) 04/03/2024   CBG (last 3)  Recent Labs    04/13/24 2119 04/14/24 0821 04/14/24 1143  GLUCAP 198* 168* 158*     Leukocytosis   -Secondary to foot infection, resolved completely, off antibiotics 04/08/2024   Primary hypertension  - blood pressure low due to anemia on 04/06/2024, blood pressure medications held, as needed hydralazine  only, midodrine  added for now.   History of lung cancer with nonspecific right-sided chest x-ray changes.  Follow-up with PCP and primary oncologist postdischarge.   Memory impairment  -Concern from family. Recommend outpatient neuropsych testing/evaluation.   Mild hypernatremia, CKD stage IV, hypokalemia - Baseline creatinine appears to be around 2.2 to 2.4.  Potassium replaced.  Renal function stable to improving.    Moderate bilateral PAD.  Glycemic control, check lipid panel tomorrow, will place on aspirin , statin if needed, outpatient follow-up with vascular surgery.  Urinary retention.  On Flomax , In-N-Out catheter, if retention becomes recurrent then Foley.       Condition -   Guarded  Family Communication  : discussed with brother at bedside daily  Code Status : Full code   Consults  : Dr. Harden  PUD Prophylaxis : PPI   Procedures  :     ABI.  Moderate bilateral disease discussed with Dr. Harden.      Disposition Plan  :    Status is: Inpatient   DVT Prophylaxis  :    SCD's Start: 04/05/24 1611 SCD's Start: 04/05/24 1611 SCDs Start: 04/03/24 1433    Lab Results  Component Value Date   PLT 235 04/10/2024    Diet :  Diet Order             DIET - DYS 1 Room service appropriate? Yes; Fluid consistency: Thin  Diet effective now                    Inpatient Medications  Scheduled Meds:  acidophilus  1 capsule Oral Daily   vitamin C   1,000 mg Oral Daily   atropine   1 drop Both Eyes Daily   darbepoetin (ARANESP ) injection - NON-DIALYSIS  200 mcg Subcutaneous Q Wed-1800   docusate sodium   200 mg Oral BID   feeding supplement (GLUCERNA SHAKE)  237 mL Oral TID BM   ferrous sulfate   325 mg Oral Daily   folic acid   1 mg Oral Daily   insulin  aspart  0-9 Units Subcutaneous TID WC   insulin  glargine  16 Units Subcutaneous QHS   midodrine   10 mg Oral TID WC   nutrition supplement (  JUVEN)  1 packet Oral BID BM   nystatin   5 mL Mouth/Throat QID   pantoprazole   40 mg Oral BID   polyethylene glycol  17 g Oral BID   prednisoLONE  acetate  1 drop Both Eyes QID   tamsulosin   0.4 mg Oral Daily   zinc  sulfate (50mg  elemental zinc )  220 mg Oral Daily   Continuous Infusions:   PRN Meds:.acetaminophen , chlorproMAZINE  (THORAZINE ) injection, dextrose , hydrALAZINE , HYDROmorphone  (DILAUDID ) injection, labetalol , metoCLOPramide  (REGLAN ) injection, ondansetron  (ZOFRAN ) IV,  oxyCODONE , phenol   Objective:   Vitals:   04/14/24 0346 04/14/24 0400 04/14/24 0817 04/14/24 1141  BP: 129/66 122/65 111/64 127/69  Pulse: (!) 104 100    Resp: 19 19  20   Temp: 98.5 F (36.9 C)  98.2 F (36.8 C) 99.5 F (37.5 C)  TempSrc: Oral  Oral Oral  SpO2: 99% 97%    Weight:      Height:        Wt Readings from Last 3 Encounters:  04/05/24 65.8 kg  04/02/24 65.8 kg  03/26/24 69.9 kg     Intake/Output Summary (Last 24 hours) at 04/14/2024 1200 Last data filed at 04/14/2024 1143 Gross per 24 hour  Intake --  Output 1500 ml  Net -1500 ml     Physical Exam  Awake, alert, repetitive, extremely frail, deconditioned, with hiccups No wheezing or rhonchi Abdomen soft Regular rate and rhythm Right BKA with wound VAC, left foot with no gangrene or cyanosis, connected to SCDs.   Data Review:    Recent Labs  Lab 04/09/24 0223 04/10/24 0308 04/11/24 0232 04/12/24 0744 04/13/24 0547 04/14/24 0538  WBC 7.9 11.3*  --   --   --   --   HGB 6.0* 5.8* 5.3* 5.2* 5.9* 5.4*  HCT 19.0* 18.7* 16.7* 16.3* 18.8* 17.1*  PLT 209 235  --   --   --   --   MCV 100.5* 100.0  --   --   --   --   MCH 31.7 31.0  --   --   --   --   MCHC 31.6 31.0  --   --   --   --   RDW 12.9 12.9  --   --   --   --   LYMPHSABS 0.3* 0.3*  --   --   --   --   MONOABS 0.6 0.8  --   --   --   --   EOSABS 0.0 0.1  --   --   --   --   BASOSABS 0.0 0.0  --   --   --   --     Recent Labs  Lab 04/09/24 0223  NA 137  K 4.0  CL 107  CO2 22  ANIONGAP 8  GLUCOSE 186*  BUN 66*  CREATININE 2.35*  CALCIUM 8.3*      Recent Labs  Lab 04/09/24 0223  CALCIUM 8.3*    --------------------------------------------------------------------------------------------------------------- Lab Results  Component Value Date   CHOL 108 04/06/2024   HDL 35 (L) 04/06/2024   LDLCALC 62 04/06/2024   TRIG 59 04/06/2024   CHOLHDL 3.1 04/06/2024    Lab Results  Component Value Date   HGBA1C 9.2 (H)  04/03/2024   No results for input(s): TSH, T4TOTAL, FREET4, T3FREE, THYROIDAB in the last 72 hours. No results for input(s): VITAMINB12, FOLATE, FERRITIN, TIBC, IRON , RETICCTPCT in the last 72 hours.  ------------------------------------------------------------------------------------------------------------------ Cardiac Enzymes No results for input(s): CKMB, TROPONINI, MYOGLOBIN in  the last 168 hours.  Invalid input(s): CK  Micro Results No results found for this or any previous visit (from the past 240 hours).   Radiology Report DG Abd Portable 1V Result Date: 04/12/2024 CLINICAL DATA:  Vomiting EXAM: PORTABLE ABDOMEN - 1 VIEW COMPARISON:  04/05/2024 FINDINGS: 2 supine views of the abdomen and pelvis. No gaseous distention of bowel loops. No gross free intraperitoneal air. Large amount of ascending colonic stool. No abnormal abdominal calcifications. No appendicolith. Mild right hemidiaphragm elevation. IMPRESSION: 1. No acute findings. 2. Possible constipation. Electronically Signed   By: Rockey Kilts M.D.   On: 04/12/2024 14:55      Signature  -   Brayton Lye M.D on 04/14/2024 at 12:00 PM   -  To page go to [email or web address]   "

## 2024-04-14 NOTE — Consult Note (Addendum)
" °  CLINICAL SUPPORT TEAM - WOUND OSTOMY AND CONTINENCE TEAM  CONSULTATION SERVICES   WOC Nurse-Inpatient Note    WOC Nurse Consult Note: Reason for Consult: sacral pressure injury  Wound type:Deep Tissue Pressure Injury sacrum purple maroon discoloration that is evolving with some pink tissue noted as well as some dark developing eschar  Pressure Injury POA: no, admitted 1/14 no photo  Measurement:see nursing flowsheet  Wound bed: as above  Drainage (amount, consistency, odor) see nursing flowsheet  Periwound: dark discoloration  Dressing procedure/placement/frequency: Cleanse sacral wound with Vashe, do not rinse. Apply Xeroform gauze (TI#[id number]) to wound bed daily and secure with silicone foam.   Patient should be placed on a low air loss mattress for pressure redistribution and moisture management.   POC discussed with bedside nurse. WOC team will follow every 7 to 10 days for assessment and change in POC.  Deep Tissue Pressure Injuries are high risk to deteriorate. Please reconsult for acute changes to wound bed.   Thank you,    Delano Frate MSN, RN-BC, CWOCN    "

## 2024-04-14 NOTE — Progress Notes (Signed)
 [facility] night cross cover note:   The patient and the patient's daughter, present at bedside, are conveying that the patient's intractable hiccups as well as his recurrent nausea/vomiting have been poorly controlled with existing regimen of Thorazine  25 mg IM 3 times daily as needed for hiccups in addition to his multiple as needed antiemetics that include prn IV Zofran  as well as as needed IV Reglan .   In order to try to address the refractory nature of both his intractable hiccups as well as his persistent nausea/vomiting, I have ordered, for now, a single dose of IV Haldol , which serves as a second line intervention for intractable hiccups and also has a very good antiemetic profile.  Will monitor response and the patient's intractable hiccups and nausea/vomiting to the Haldol  relative to existing regimen to help guide treatment modalities moving forward.     Eva Pore, DO Hospitalist

## 2024-04-14 NOTE — Plan of Care (Signed)
 " Problem: Education: Goal: Ability to describe self-care measures that may prevent or decrease complications (Diabetes Survival Skills Education) will improve Outcome: Progressing Goal: Individualized Educational Video(s) Outcome: Progressing   Problem: Coping: Goal: Ability to adjust to condition or change in health will improve Outcome: Progressing   Problem: Fluid Volume: Goal: Ability to maintain a balanced intake and output will improve Outcome: Progressing   Problem: Health Behavior/Discharge Planning: Goal: Ability to identify and utilize available resources and services will improve Outcome: Progressing Goal: Ability to manage health-related needs will improve Outcome: Progressing   Problem: Metabolic: Goal: Ability to maintain appropriate glucose levels will improve Outcome: Progressing   Problem: Nutritional: Goal: Maintenance of adequate nutrition will improve Outcome: Progressing Goal: Progress toward achieving an optimal weight will improve Outcome: Progressing   Problem: Skin Integrity: Goal: Risk for impaired skin integrity will decrease Outcome: Progressing   Problem: Tissue Perfusion: Goal: Adequacy of tissue perfusion will improve Outcome: Progressing   Problem: Education: Goal: Ability to describe self-care measures that may prevent or decrease complications (Diabetes Survival Skills Education) will improve Outcome: Progressing Goal: Individualized Educational Video(s) Outcome: Progressing   Problem: Cardiac: Goal: Ability to maintain an adequate cardiac output will improve Outcome: Progressing   Problem: Health Behavior/Discharge Planning: Goal: Ability to identify and utilize available resources and services will improve Outcome: Progressing Goal: Ability to manage health-related needs will improve Outcome: Progressing   Problem: Fluid Volume: Goal: Ability to achieve a balanced intake and output will improve Outcome: Progressing    Problem: Metabolic: Goal: Ability to maintain appropriate glucose levels will improve Outcome: Progressing   Problem: Nutritional: Goal: Maintenance of adequate nutrition will improve Outcome: Progressing Goal: Maintenance of adequate weight for body size and type will improve Outcome: Progressing   Problem: Respiratory: Goal: Will regain and/or maintain adequate ventilation Outcome: Progressing   Problem: Urinary Elimination: Goal: Ability to achieve and maintain adequate renal perfusion and functioning will improve Outcome: Progressing   Problem: Education: Goal: Knowledge of General Education information will improve Description: Including pain rating scale, medication(s)/side effects and non-pharmacologic comfort measures Outcome: Progressing   Problem: Health Behavior/Discharge Planning: Goal: Ability to manage health-related needs will improve Outcome: Progressing   Problem: Clinical Measurements: Goal: Ability to maintain clinical measurements within normal limits will improve Outcome: Progressing Goal: Will remain free from infection Outcome: Progressing Goal: Diagnostic test results will improve Outcome: Progressing Goal: Respiratory complications will improve Outcome: Progressing Goal: Cardiovascular complication will be avoided Outcome: Progressing   Problem: Activity: Goal: Risk for activity intolerance will decrease Outcome: Progressing   Problem: Nutrition: Goal: Adequate nutrition will be maintained Outcome: Progressing   Problem: Coping: Goal: Level of anxiety will decrease Outcome: Progressing   Problem: Elimination: Goal: Will not experience complications related to bowel motility Outcome: Progressing Goal: Will not experience complications related to urinary retention Outcome: Progressing   Problem: Pain Managment: Goal: General experience of comfort will improve and/or be controlled Outcome: Progressing   Problem: Safety: Goal:  Ability to remain free from injury will improve Outcome: Progressing   Problem: Skin Integrity: Goal: Risk for impaired skin integrity will decrease Outcome: Progressing   Problem: Education: Goal: Knowledge of the prescribed therapeutic regimen will improve Outcome: Progressing   Problem: Bowel/Gastric: Goal: Gastrointestinal status for postoperative course will improve Outcome: Progressing   Problem: Cardiac: Goal: Ability to maintain an adequate cardiac output Outcome: Progressing Goal: Will show no evidence of cardiac arrhythmias Outcome: Progressing   Problem: Nutritional: Goal: Will attain and maintain optimal nutritional status Outcome:  Progressing   Problem: Neurological: Goal: Will regain or maintain usual level of consciousness Outcome: Progressing   Problem: Clinical Measurements: Goal: Ability to maintain clinical measurements within normal limits Outcome: Progressing Goal: Postoperative complications will be avoided or minimized Outcome: Progressing   "

## 2024-04-14 NOTE — TOC Progression Note (Signed)
 Transition of Care [facility]) - Progression Note    Patient Details  Name: Gene Fletcher MRN: [id number] Date of Birth: 1951/02/15  [facility]) CM/SW Contact  Inocente GORMAN Kindle, LCSW Phone Number: 04/14/2024, 10:54 AM  Clinical Narrative:    CSW continuing to follow for medical stability.    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, SNF Pending bed offer               Expected Discharge Plan and Services In-house Referral: Clinical Social Work   Post Acute Care Choice: Skilled Nursing Facility Living arrangements for the past 2 months: Single Family Home                                       Social Drivers of Health (SDOH) Interventions SDOH Screenings   Food Insecurity: No Food Insecurity (04/03/2024)  Housing: Low Risk (04/03/2024)  Transportation Needs: No Transportation Needs (04/03/2024)  Utilities: Not At Risk (04/03/2024)  Depression (PHQ2-9): Low Risk (11/07/2023)  Social Connections: Socially Integrated (04/03/2024)  Tobacco Use: Low Risk (04/05/2024)    Readmission Risk Interventions     No data to display

## 2024-04-15 DIAGNOSIS — E11628 Type 2 diabetes mellitus with other skin complications: Secondary | ICD-10-CM | POA: Diagnosis not present

## 2024-04-15 DIAGNOSIS — L089 Local infection of the skin and subcutaneous tissue, unspecified: Secondary | ICD-10-CM | POA: Diagnosis not present

## 2024-04-15 LAB — CBC
HCT: 15 % — ABNORMAL LOW (ref 39.0–52.0)
Hemoglobin: 4.7 g/dL — CL (ref 13.0–17.0)
MCH: 33.3 pg (ref 26.0–34.0)
MCHC: 31.3 g/dL (ref 30.0–36.0)
MCV: 106.4 fL — ABNORMAL HIGH (ref 80.0–100.0)
Platelets: 219 10*3/uL (ref 150–400)
RBC: 1.41 MIL/uL — ABNORMAL LOW (ref 4.22–5.81)
RDW: 18 % — ABNORMAL HIGH (ref 11.5–15.5)
WBC: 9.7 10*3/uL (ref 4.0–10.5)
nRBC: 0.5 % — ABNORMAL HIGH (ref 0.0–0.2)

## 2024-04-15 LAB — BASIC METABOLIC PANEL WITH GFR
Anion gap: 8 (ref 5–15)
BUN: 61 mg/dL — ABNORMAL HIGH (ref 8–23)
CO2: 26 mmol/L (ref 22–32)
Calcium: 8.9 mg/dL (ref 8.9–10.3)
Chloride: 111 mmol/L (ref 98–111)
Creatinine, Ser: 2.45 mg/dL — ABNORMAL HIGH (ref 0.61–1.24)
GFR, Estimated: 27 mL/min — ABNORMAL LOW
Glucose, Bld: 258 mg/dL — ABNORMAL HIGH (ref 70–99)
Potassium: 4.2 mmol/L (ref 3.5–5.1)
Sodium: 145 mmol/L (ref 135–145)

## 2024-04-15 LAB — GLUCOSE, CAPILLARY
Glucose-Capillary: 231 mg/dL — ABNORMAL HIGH (ref 70–99)
Glucose-Capillary: 170 mg/dL — ABNORMAL HIGH (ref 70–99)
Glucose-Capillary: 146 mg/dL — ABNORMAL HIGH (ref 70–99)
Glucose-Capillary: 215 mg/dL — ABNORMAL HIGH (ref 70–99)

## 2024-04-15 MED ORDER — HALOPERIDOL LACTATE 2 MG/ML PO CONC: 2.0000 mg | Freq: Four times a day (QID) | ORAL | Status: AC | PRN

## 2024-04-15 MED ORDER — GLYCOPYRROLATE 0.2 MG/ML IJ SOLN: 0.2000 mg | INTRAMUSCULAR | Status: AC | PRN

## 2024-04-15 MED ORDER — DIPHENHYDRAMINE HCL 50 MG/ML IJ SOLN: 25.0000 mg | INTRAMUSCULAR | Status: AC | PRN

## 2024-04-15 MED ORDER — HALOPERIDOL LACTATE 5 MG/ML IJ SOLN
2.0000 mg | Freq: Four times a day (QID) | INTRAMUSCULAR | Status: AC | PRN
  Administered 2024-04-19 – 2024-04-22 (×2): 2 mg via INTRAVENOUS
  Filled 2024-04-15 (×2): qty 1

## 2024-04-15 MED ORDER — LORAZEPAM 2 MG/ML IJ SOLN
1.0000 mg | INTRAMUSCULAR | Status: AC | PRN
  Filled 2024-04-15: qty 1

## 2024-04-15 MED ORDER — LORAZEPAM 2 MG/ML PO CONC: 1.0000 mg | ORAL | Status: AC | PRN

## 2024-04-15 MED ORDER — ONDANSETRON 4 MG PO TBDP: 4.0000 mg | ORAL_TABLET | Freq: Four times a day (QID) | ORAL | Status: AC | PRN

## 2024-04-15 MED ORDER — ACETAMINOPHEN 325 MG PO TABS
650.0000 mg | ORAL_TABLET | Freq: Four times a day (QID) | ORAL | Status: AC | PRN
  Administered 2024-04-18: 650 mg via ORAL
  Filled 2024-04-15: qty 2

## 2024-04-15 MED ORDER — HYDROMORPHONE HCL 1 MG/ML IJ SOLN: 1.0000 mg | INTRAMUSCULAR | Status: AC | PRN

## 2024-04-15 MED ORDER — PROCHLORPERAZINE EDISYLATE 10 MG/2ML IJ SOLN
10.0000 mg | Freq: Four times a day (QID) | INTRAMUSCULAR | Status: AC
  Administered 2024-04-15 – 2024-04-26 (×41): 10 mg via INTRAVENOUS
  Filled 2024-04-15 (×41): qty 2

## 2024-04-15 MED ORDER — ONDANSETRON HCL 4 MG/2ML IJ SOLN: 4.0000 mg | Freq: Four times a day (QID) | INTRAMUSCULAR | Status: AC | PRN

## 2024-04-15 MED ORDER — GLUCERNA SHAKE PO LIQD: 237.0000 mL | ORAL | Status: AC | PRN

## 2024-04-15 MED ORDER — LORAZEPAM 2 MG/ML IJ SOLN
0.5000 mg | Freq: Four times a day (QID) | INTRAMUSCULAR | Status: AC
  Administered 2024-04-15 – 2024-04-26 (×39): 0.5 mg via INTRAVENOUS
  Filled 2024-04-15 (×41): qty 1

## 2024-04-15 MED ORDER — BISACODYL 10 MG RE SUPP
10.0000 mg | Freq: Once | RECTAL | Status: AC
  Administered 2024-04-15: 10 mg via RECTAL
  Filled 2024-04-15: qty 1

## 2024-04-15 MED ORDER — POLYVINYL ALCOHOL 1.4 % OP SOLN: 1.0000 [drp] | Freq: Four times a day (QID) | OPHTHALMIC | Status: AC | PRN

## 2024-04-15 MED ORDER — HALOPERIDOL 1 MG PO TABS: 2.0000 mg | ORAL_TABLET | Freq: Four times a day (QID) | ORAL | Status: AC | PRN

## 2024-04-15 MED ORDER — LORAZEPAM 1 MG PO TABS: 1.0000 mg | ORAL_TABLET | ORAL | Status: AC | PRN

## 2024-04-15 MED ORDER — BIOTENE DRY MOUTH MT LIQD
15.0000 mL | Freq: Two times a day (BID) | OROMUCOSAL | Status: AC
  Administered 2024-04-15 – 2024-04-26 (×20): 15 mL via TOPICAL

## 2024-04-15 MED ORDER — GLYCOPYRROLATE 1 MG PO TABS: 1.0000 mg | ORAL_TABLET | ORAL | Status: AC | PRN

## 2024-04-15 MED ORDER — ACETAMINOPHEN 650 MG RE SUPP: 650.0000 mg | Freq: Four times a day (QID) | RECTAL | Status: AC | PRN

## 2024-04-15 NOTE — Progress Notes (Signed)
 OT Cancellation Note  Patient Details Name: Gene Fletcher MRN: [id number] DOB: 12-24-1950   Cancelled Treatment:    Reason Eval/Treat Not Completed: Other (comment) OT to hold this date, Hgb continues to trend down, most recent lab result is 4.7. OT to continue to follow Pt acutely and coordinate with providers when appropriate.   Maurilio LITTIE, OTR/LSABRA  [facility] Acute Rehabilitation  Office: [phone]   Maurilio PARAS [name] 04/15/2024, 10:33 AM

## 2024-04-15 NOTE — Plan of Care (Signed)
 " Problem: Education: Goal: Ability to describe self-care measures that may prevent or decrease complications (Diabetes Survival Skills Education) will improve Outcome: Progressing Goal: Individualized Educational Video(s) Outcome: Progressing   Problem: Coping: Goal: Ability to adjust to condition or change in health will improve Outcome: Progressing   Problem: Fluid Volume: Goal: Ability to maintain a balanced intake and output will improve Outcome: Progressing   Problem: Health Behavior/Discharge Planning: Goal: Ability to identify and utilize available resources and services will improve Outcome: Progressing Goal: Ability to manage health-related needs will improve Outcome: Progressing   Problem: Metabolic: Goal: Ability to maintain appropriate glucose levels will improve Outcome: Progressing   Problem: Nutritional: Goal: Maintenance of adequate nutrition will improve Outcome: Progressing Goal: Progress toward achieving an optimal weight will improve Outcome: Progressing   Problem: Skin Integrity: Goal: Risk for impaired skin integrity will decrease Outcome: Progressing   Problem: Tissue Perfusion: Goal: Adequacy of tissue perfusion will improve Outcome: Progressing   Problem: Education: Goal: Ability to describe self-care measures that may prevent or decrease complications (Diabetes Survival Skills Education) will improve Outcome: Progressing Goal: Individualized Educational Video(s) Outcome: Progressing   Problem: Cardiac: Goal: Ability to maintain an adequate cardiac output will improve Outcome: Progressing   Problem: Health Behavior/Discharge Planning: Goal: Ability to identify and utilize available resources and services will improve Outcome: Progressing Goal: Ability to manage health-related needs will improve Outcome: Progressing   Problem: Fluid Volume: Goal: Ability to achieve a balanced intake and output will improve Outcome: Progressing    Problem: Metabolic: Goal: Ability to maintain appropriate glucose levels will improve Outcome: Progressing   Problem: Nutritional: Goal: Maintenance of adequate nutrition will improve Outcome: Progressing Goal: Maintenance of adequate weight for body size and type will improve Outcome: Progressing   Problem: Respiratory: Goal: Will regain and/or maintain adequate ventilation Outcome: Progressing   Problem: Urinary Elimination: Goal: Ability to achieve and maintain adequate renal perfusion and functioning will improve Outcome: Progressing   Problem: Education: Goal: Knowledge of General Education information will improve Description: Including pain rating scale, medication(s)/side effects and non-pharmacologic comfort measures Outcome: Progressing   Problem: Health Behavior/Discharge Planning: Goal: Ability to manage health-related needs will improve Outcome: Progressing   Problem: Clinical Measurements: Goal: Ability to maintain clinical measurements within normal limits will improve Outcome: Progressing Goal: Will remain free from infection Outcome: Progressing Goal: Diagnostic test results will improve Outcome: Progressing Goal: Respiratory complications will improve Outcome: Progressing Goal: Cardiovascular complication will be avoided Outcome: Progressing   Problem: Activity: Goal: Risk for activity intolerance will decrease Outcome: Progressing   Problem: Nutrition: Goal: Adequate nutrition will be maintained Outcome: Progressing   Problem: Coping: Goal: Level of anxiety will decrease Outcome: Progressing   Problem: Elimination: Goal: Will not experience complications related to bowel motility Outcome: Progressing Goal: Will not experience complications related to urinary retention Outcome: Progressing   Problem: Pain Managment: Goal: General experience of comfort will improve and/or be controlled Outcome: Progressing   Problem: Safety: Goal:  Ability to remain free from injury will improve Outcome: Progressing   Problem: Skin Integrity: Goal: Risk for impaired skin integrity will decrease Outcome: Progressing   Problem: Education: Goal: Knowledge of the prescribed therapeutic regimen will improve Outcome: Progressing   Problem: Bowel/Gastric: Goal: Gastrointestinal status for postoperative course will improve Outcome: Progressing   Problem: Cardiac: Goal: Ability to maintain an adequate cardiac output Outcome: Progressing Goal: Will show no evidence of cardiac arrhythmias Outcome: Progressing   Problem: Nutritional: Goal: Will attain and maintain optimal nutritional status Outcome:  Progressing   Problem: Neurological: Goal: Will regain or maintain usual level of consciousness Outcome: Progressing   Problem: Clinical Measurements: Goal: Ability to maintain clinical measurements within normal limits Outcome: Progressing Goal: Postoperative complications will be avoided or minimized Outcome: Progressing   "

## 2024-04-15 NOTE — Plan of Care (Signed)
 " Problem: Education: Goal: Ability to describe self-care measures that may prevent or decrease complications (Diabetes Survival Skills Education) will improve 04/15/2024 1706 by Jerona Expose, Aquilla HERO, RN Outcome: Progressing 04/15/2024 1556 by Jerona Expose, Aquilla HERO, RN Outcome: Progressing Goal: Individualized Educational Video(s) 04/15/2024 1706 by Jerona Expose Aquilla HERO, RN Outcome: Progressing 04/15/2024 1556 by Jerona Ketchiozo, Aquilla HERO, RN Outcome: Progressing   Problem: Coping: Goal: Ability to adjust to condition or change in health will improve 04/15/2024 1706 by Fanbissi Ketchiozo, Aquilla HERO, RN Outcome: Progressing 04/15/2024 1556 by Jerona Ketchiozo, Aquilla HERO, RN Outcome: Progressing   Problem: Fluid Volume: Goal: Ability to maintain a balanced intake and output will improve 04/15/2024 1706 by Fanbissi Ketchiozo, Aquilla HERO, RN Outcome: Progressing 04/15/2024 1556 by Jerona Ketchiozo, Aquilla HERO, RN Outcome: Progressing   Problem: Health Behavior/Discharge Planning: Goal: Ability to identify and utilize available resources and services will improve 04/15/2024 1706 by Jerona Expose, Aquilla HERO, RN Outcome: Progressing 04/15/2024 1556 by Jerona Expose Aquilla HERO, RN Outcome: Progressing Goal: Ability to manage health-related needs will improve 04/15/2024 1706 by Jerona Expose, Aquilla HERO, RN Outcome: Progressing 04/15/2024 1556 by Jerona Ketchiozo, Aquilla HERO, RN Outcome: Progressing   Problem: Metabolic: Goal: Ability to maintain appropriate glucose levels will improve 04/15/2024 1706 by Jerona Expose, Aquilla HERO, RN Outcome: Progressing 04/15/2024 1556 by Jerona Expose Aquilla HERO, RN Outcome: Progressing   Problem: Nutritional: Goal: Maintenance of adequate nutrition will improve 04/15/2024 1706 by Jerona Expose, Aquilla HERO, RN Outcome:  Progressing 04/15/2024 1556 by Jerona Ketchiozo, Aquilla HERO, RN Outcome: Progressing Goal: Progress toward achieving an optimal weight will improve 04/15/2024 1706 by Jerona Expose, Aquilla HERO, RN Outcome: Progressing 04/15/2024 1556 by Jerona Expose Aquilla HERO, RN Outcome: Progressing   Problem: Skin Integrity: Goal: Risk for impaired skin integrity will decrease 04/15/2024 1706 by Jerona Expose, Aquilla HERO, RN Outcome: Progressing 04/15/2024 1556 by Jerona Ketchiozo, Aquilla HERO, RN Outcome: Progressing   Problem: Tissue Perfusion: Goal: Adequacy of tissue perfusion will improve 04/15/2024 1706 by Jerona Expose, Aquilla HERO, RN Outcome: Progressing 04/15/2024 1556 by Jerona Ketchiozo, Aquilla HERO, RN Outcome: Progressing   Problem: Education: Goal: Ability to describe self-care measures that may prevent or decrease complications (Diabetes Survival Skills Education) will improve 04/15/2024 1706 by Jerona Expose, Aquilla HERO, RN Outcome: Progressing 04/15/2024 1556 by Jerona Expose Aquilla HERO, RN Outcome: Progressing Goal: Individualized Educational Video(s) 04/15/2024 1706 by Jerona Expose Aquilla HERO, RN Outcome: Progressing 04/15/2024 1556 by Jerona Ketchiozo, Aquilla HERO, RN Outcome: Progressing   Problem: Cardiac: Goal: Ability to maintain an adequate cardiac output will improve 04/15/2024 1706 by Fanbissi Ketchiozo, Aquilla HERO, RN Outcome: Progressing 04/15/2024 1556 by Jerona Ketchiozo, Aquilla HERO, RN Outcome: Progressing   Problem: Health Behavior/Discharge Planning: Goal: Ability to identify and utilize available resources and services will improve 04/15/2024 1706 by Jerona Expose, Aquilla HERO, RN Outcome: Progressing 04/15/2024 1556 by Jerona Expose Aquilla HERO, RN Outcome: Progressing Goal: Ability to manage health-related needs will improve 04/15/2024 1706 by Jerona Expose, Aquilla HERO, RN Outcome: Progressing 04/15/2024 1556 by Jerona Expose Aquilla HERO, RN Outcome: Progressing   Problem: Fluid Volume: Goal: Ability to achieve a balanced intake and output will improve 04/15/2024 1706 by Jerona Expose, Aquilla HERO, RN Outcome: Progressing 04/15/2024 1556 by Jerona Expose Aquilla HERO, RN Outcome: Progressing   Problem: Metabolic: Goal: Ability to maintain appropriate glucose levels will improve 04/15/2024 1706 by Jerona Expose, Aquilla HERO, RN Outcome: Progressing 04/15/2024 1556 by Jerona Ketchiozo, Aquilla HERO, RN Outcome: Progressing  Problem: Nutritional: Goal: Maintenance of adequate nutrition will improve 04/15/2024 1706 by Jerona Expose, Aquilla HERO, RN Outcome: Progressing 04/15/2024 1556 by Jerona Ketchiozo, Aquilla HERO, RN Outcome: Progressing Goal: Maintenance of adequate weight for body size and type will improve 04/15/2024 1706 by Jerona Expose, Aquilla HERO, RN Outcome: Progressing 04/15/2024 1556 by Jerona Expose Aquilla HERO, RN Outcome: Progressing   Problem: Respiratory: Goal: Will regain and/or maintain adequate ventilation 04/15/2024 1706 by Jerona Expose, Aquilla HERO, RN Outcome: Progressing 04/15/2024 1556 by Jerona Ketchiozo, Aquilla HERO, RN Outcome: Progressing   Problem: Urinary Elimination: Goal: Ability to achieve and maintain adequate renal perfusion and functioning will improve 04/15/2024 1706 by Fanbissi Ketchiozo, Aquilla HERO, RN Outcome: Progressing 04/15/2024 1556 by Jerona Ketchiozo, Aquilla HERO, RN Outcome: Progressing   Problem: Education: Goal: Knowledge of General Education information will improve Description: Including pain rating scale, medication(s)/side effects and non-pharmacologic comfort measures 04/15/2024 1706 by Jerona Expose, Aquilla HERO, RN Outcome: Progressing 04/15/2024 1556 by Jerona Ketchiozo,  Aquilla HERO, RN Outcome: Progressing   Problem: Health Behavior/Discharge Planning: Goal: Ability to manage health-related needs will improve 04/15/2024 1706 by Jerona Expose, Aquilla HERO, RN Outcome: Progressing 04/15/2024 1556 by Jerona Ketchiozo, Aquilla HERO, RN Outcome: Progressing   Problem: Clinical Measurements: Goal: Ability to maintain clinical measurements within normal limits will improve 04/15/2024 1706 by Fanbissi Ketchiozo, Aquilla HERO, RN Outcome: Progressing 04/15/2024 1556 by Jerona Expose Aquilla HERO, RN Outcome: Progressing Goal: Will remain free from infection 04/15/2024 1706 by Jerona Expose Aquilla HERO, RN Outcome: Progressing 04/15/2024 1556 by Jerona Expose Aquilla HERO, RN Outcome: Progressing Goal: Diagnostic test results will improve 04/15/2024 1706 by Jerona Expose, Aquilla HERO, RN Outcome: Progressing 04/15/2024 1556 by Jerona Expose Aquilla HERO, RN Outcome: Progressing Goal: Respiratory complications will improve 04/15/2024 1706 by Jerona Expose Aquilla HERO, RN Outcome: Progressing 04/15/2024 1556 by Jerona Expose Aquilla HERO, RN Outcome: Progressing Goal: Cardiovascular complication will be avoided 04/15/2024 1706 by Jerona Expose Aquilla HERO, RN Outcome: Progressing 04/15/2024 1556 by Jerona Ketchiozo, Aquilla HERO, RN Outcome: Progressing   Problem: Activity: Goal: Risk for activity intolerance will decrease 04/15/2024 1706 by Jerona Expose, Aquilla HERO, RN Outcome: Progressing 04/15/2024 1556 by Jerona Expose Aquilla HERO, RN Outcome: Progressing   Problem: Nutrition: Goal: Adequate nutrition will be maintained 04/15/2024 1706 by Jerona Expose, Aquilla HERO, RN Outcome: Progressing 04/15/2024 1556 by Jerona Expose Aquilla HERO, RN Outcome: Progressing   Problem: Coping: Goal: Level of anxiety will decrease 04/15/2024 1706 by Jerona Expose, Aquilla HERO, RN Outcome: Progressing 04/15/2024 1556 by Jerona Expose Aquilla HERO, RN Outcome: Progressing   Problem: Elimination: Goal: Will not experience complications related to bowel motility 04/15/2024 1706 by Jerona Expose, Aquilla HERO, RN Outcome: Progressing 04/15/2024 1556 by Jerona Expose Aquilla HERO, RN Outcome: Progressing Goal: Will not experience complications related to urinary retention 04/15/2024 1706 by Jerona Expose, Aquilla HERO, RN Outcome: Progressing 04/15/2024 1556 by Jerona Expose Aquilla HERO, RN Outcome: Progressing   Problem: Pain Managment: Goal: General experience of comfort will improve and/or be controlled 04/15/2024 1706 by Jerona Expose, Aquilla HERO, RN Outcome: Progressing 04/15/2024 1556 by Jerona Expose Aquilla HERO, RN Outcome: Progressing   Problem: Safety: Goal: Ability to remain free from injury will improve 04/15/2024 1706 by Jerona Expose, Aquilla HERO, RN Outcome: Progressing 04/15/2024 1556 by Jerona Expose Aquilla HERO, RN Outcome: Progressing   Problem: Skin Integrity: Goal: Risk for impaired skin integrity will decrease 04/15/2024 1706 by Jerona Expose Aquilla HERO, RN Outcome: Progressing 04/15/2024 1556 by Jerona Ketchiozo, Aquilla HERO, RN  Outcome: Progressing   Problem: Education: Goal: Knowledge of the prescribed therapeutic regimen will improve 04/15/2024 1706 by Jerona Expose, Aquilla HERO, RN Outcome: Progressing 04/15/2024 1556 by Jerona Ketchiozo, Aquilla HERO, RN Outcome: Progressing   Problem: Bowel/Gastric: Goal: Gastrointestinal status for postoperative course will improve 04/15/2024 1706 by Jerona Expose, Aquilla HERO, RN Outcome: Progressing 04/15/2024 1556 by Jerona Expose, Aquilla HERO, RN Outcome: Progressing   Problem: Cardiac: Goal: Ability to maintain an adequate cardiac output 04/15/2024 1706 by  Jerona Expose, Aquilla HERO, RN Outcome: Progressing 04/15/2024 1556 by Jerona Expose Aquilla HERO, RN Outcome: Progressing Goal: Will show no evidence of cardiac arrhythmias 04/15/2024 1706 by Jerona Expose, Aquilla HERO, RN Outcome: Progressing 04/15/2024 1556 by Jerona Expose Aquilla HERO, RN Outcome: Progressing   Problem: Nutritional: Goal: Will attain and maintain optimal nutritional status 04/15/2024 1706 by Jerona Expose Aquilla HERO, RN Outcome: Progressing 04/15/2024 1556 by Jerona Expose Aquilla HERO, RN Outcome: Progressing   Problem: Neurological: Goal: Will regain or maintain usual level of consciousness 04/15/2024 1706 by Jerona Expose, Aquilla HERO, RN Outcome: Progressing 04/15/2024 1556 by Jerona Expose Aquilla HERO, RN Outcome: Progressing   Problem: Clinical Measurements: Goal: Ability to maintain clinical measurements within normal limits 04/15/2024 1706 by Jerona Expose, Aquilla HERO, RN Outcome: Progressing 04/15/2024 1556 by Jerona Expose Aquilla HERO, RN Outcome: Progressing Goal: Postoperative complications will be avoided or minimized 04/15/2024 1706 by Jerona Expose Aquilla HERO, RN Outcome: Progressing 04/15/2024 1556 by Jerona Ketchiozo, Aquilla HERO, RN Outcome: Progressing   "

## 2024-04-15 NOTE — Consult Note (Signed)
 "                                                                                Consultation Note Date: 04/15/2024   Patient Name: Gene Fletcher  DOB: [birth year]  MRN: [id number]  Age / Sex: 74 y.o., male  PCP: Gene Hamilton, MD Referring Physician: Sherlon Brayton RAMAN, MD  Reason for Consultation: Establishing goals of care, Goals of care discussion, family would like  to talk about comfort care, Jehovah's Witness, with anemia, cannot receive blood, continues to decline   HPI/Patient Profile: 74 y.o. male  with past medical history of hypertension, hyperlipidemia, diabetes, CKD 4, lung cancer status post radiation, anemia was admitted on 04/03/2024 with diabetic foot infection/gangrene/?  Osteomyelitis, DKA, anemia refusing blood products as a Jehovah's Witness.  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  Patient's hospital course was complicated by severe anemia.  All nontransfusion measures were maximized and patient continues to decline; as a Jehovah's Witness he declines blood products.  Prior and throughout hospitalization patient has also struggled with significant nausea and vomiting, which is likely related to uremia.  Noted TOC has been in contact with patient's APS worker -they indicate family is still patient's surrogate decision makers.  Clinical Assessment and Goals of Care: I have reviewed medical records including: EPIC notes: Nursing, hospitalist, TOC, PT/OT, orthopedic.  MAR: As needed medications administered in the last 24 hours-Reglan  x 3, Zofran  x 3 Available advanced directives in ACP: HCPOA indicating surrogate decision makers A.  Burnel Fletcher, B.  Gene Fletcher Labs personally reviewed 1/26: Creatinine 2.45 assessed for opioid prescribing and prognostication, critically low hemoglobin 4.7 assessed for disease severity, helping predict prognosis and guide care Intake/output: Last documented bowel movement was 1/18  Received report  from primary RN -no acute concerns.  Per RN, patient has had ongoing nausea and vomiting today.  She indicates that Reglan  and Zofran  were effective yesterday; however, does not seem as effective today.  Went to visit patient at bedside -brother/Gene Fletcher present. Patient was lying in bed intermittently awake/asleep.  When he is awake he is agitated.  He is oriented to self, place, and partially situation.  Signs/nonverbal gestures of discomfort noted -restless and grimacing. No respiratory distress, increased work of breathing, or secretions noted.   --------------------------------------------------------------------------------------------  Advance Care Planning Conversation   Pertinent diagnoses: Severe anemia, BKA, CKD, lung cancer  The patient and/or family consented to a voluntary Advance Care Planning conversation in person. Individuals present for the conversation: Patient, patient's brother/Gene Fletcher in person, patient's daughter/Gene Fletcher via speaker phone  Summary of the conversation:  Met with patient, patient's brother, patient's daughter to discuss diagnosis, prognosis, GOC, EOL wishes, disposition, and options.  I introduced Palliative Medicine as specialized medical care for people living with serious illness. It focuses on providing relief from the symptoms and stress of a serious illness. The goal is to improve quality of life for both the patient and the family.  We discussed a brief life review of the patient as well as functional and nutritional status.  Prior to hospitalization, patient was living in a private residence with his wife who has dementia.  He was her primary  caregiver.  They have 1 daughter/Gene Fletcher.  At home, patient had recurrent falls and minimal appetite.  Family indicate that he has been frail and declining for some time.  We discussed patient's current illness and what it means in the larger context of patient's on-going co-morbidities.  Family have a clear  understanding of patient's current acute medical situation.  We discussed in detail patient's surgical outcomes, which his current issues are likely not related to surgery.  They understand that his anemia is related to his CKD.  Education provided that anemia is a common complication of CKD.  Discussed the ways medical management for his anemia had been attempted; however, it did not seem to be helping.  Reviewed that anemia and CKD is associated with increased morbidity, mortality, and hospitalizations.  Education provided that patient's significant nausea/vomiting is likely in context of worsening renal disease as no treatable or reversible cause was found.  Natural disease trajectory and expectations at EOL were discussed. I attempted to elicit values and goals of care important to the patient. The difference between aggressive medical intervention and comfort care was considered in light of the patient's goals of care.   Discussion on the difference between Palliative and Hospice care per Gene Fletcher's request. Palliative care and hospice have similar goals of managing symptoms, promoting comfort, improving quality of life, and maintaining a person's dignity. However, palliative care may be offered during any phase of a serious illness, while hospice care is usually offered when a person is expected to live for 6 months or less.  We talked about transition to comfort measures in house and what that would entail inclusive of medications to control pain, dyspnea, agitation, nausea, and itching. We discussed stopping all unnecessary measures such as blood draws, needle sticks, oxygen, antibiotics, CBGs/insulin , cardiac monitoring, IVF, and frequent vital signs. No further tests/procedures. Education provided that other non-pharmacological interventions would be utilized and all care would focus on how the patient is looking and feeling.  Provided education and counseling at length on the philosophy and benefits  of hospice care. Discussed that it offers a holistic approach to care in the setting of end-stage illness, and is about supporting the patient where they are allowing nature to take it's course. Discussed the hospice team includes RNs, physicians, social workers, and chaplains. They can provide personal care, support for the family, and help keep patient out of the hospital as well as assist with DME needs for home hospice. Education provided on the difference between home vs residential hospice. Provided reassurance that residential hospice referral could be cancelled (would anticipate hospital death) at any time if patient's condition changed and it was felt they were too unstable for transfer.  Family request time to discuss information from today prior to making final decisions.  They will call PMT this afternoon with final decisions after their discussion.  4:00 PM Received notification that patient's daughter called PMT phone.  4:15 PM Returned [facility] phone call.  She indicates that after discussions, they have opted for patient's transition to full comfort care today.  Phone call consisted of discussions dealing with the complex and emotionally intense issues of symptom management and palliative care in the setting of serious and life-threatening illness. Gene Fletcher is most concerned about patient's nausea/vomiting and his restlessness.  We discussed scheduling medication for nausea/vomiting and anxiety.  Family also indicates they are not interested in hospice transfer at this time.  They would prefer he remain in house for end-of-life care.  She expresses appreciation  for PMT assistance today.  Discussed with patient/family the importance of continued conversation with each other and the medical providers regarding overall plan of care and treatment options, ensuring decisions are within the context of the patients values and GOCs.    Questions and concerns were addressed. The  patient/family was encouraged to call with questions and/or concerns. PMT card was provided.   Outcome of the conversation: - Transition to full comfort care today - Anticipate hospital death.  Family not interested in hospice transfer  I spent 50 minutes providing separately identifiable ACP services with the patient and/or surrogate decision maker in a voluntary, in-person conversation discussing the patient's wishes and goals as detailed in the above note.  Primary Decision Maker: HCPOA - Gene Fletcher Lira/daughter    SUMMARY OF RECOMMENDATIONS   Initiated full comfort measures Continue DNR/DNI as previously documented Anticipate hospital death -family not interested in hospice transfer Continue palliative wound care Started orders for EOL symptom management as noted below Added orders to reflect full comfort measures, as well as discontinued orders that were not focused on comfort Unrestricted visitation orders were placed per current [facility] EOL visitation policy  PMT will continue to follow and support holistically  Symptom Management Dilaudid  IV PRN pain/dyspnea/increased work of breathing/RR>25 Tylenol  PRN pain/fever Biotin twice daily Benadryl  PRN itching Robinul  PRN secretions Haldol  PRN agitation/delirium Scheduled ativan  every 6 hours; PRN doses for breakthrough anxiety/seizure/sleep/distress Zofran  PRN breakthrough nausea/vomiting Scheduled Compazine  every 6 hours Reglan  PRN breakthrough nausea/vomiting Liquifilm Tears PRN dry eye Bisacodyl  suppository once    Code Status/Advance Care Planning: DNR  Palliative Prophylaxis:  Aspiration, Bowel Regimen, Delirium Protocol, Frequent Pain Assessment, Oral Care, Palliative Wound Care, and Turn Reposition  Additional Recommendations (Limitations, Scope, Preferences): Full Comfort Care  Psycho-social/Spiritual:  Desire for further Chaplaincy support:no Created space and opportunity for patient and family to  express thoughts and feelings regarding patient's current medical situation.  Emotional support and therapeutic listening provided.  Prognosis:  < 2 weeks  Discharge Planning: Anticipated Hospital Death      Primary Diagnoses: Present on Admission:  Malignant 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)  Hyperlipidemia  Essential hypertension  Anemia  Chronic kidney disease, stage 4 (severe) (HCC)  Diabetic foot infection (HCC)   I have reviewed the medical record, interviewed the patient and family, and examined the patient. The following aspects are pertinent.  Past Medical History:  Diagnosis Date   Arthritis    Diabetes mellitus without complication (HCC)    Hyperlipidemia    Hypertension    Social History   Socioeconomic History   Marital status: Married    Spouse name: Not on file   Number of children: 1   Years of education: Not on file   Highest education level: Not on file  Occupational History   Not on file  Tobacco Use   Smoking status: Never    Passive exposure: Past   Smokeless tobacco: Never  Vaping Use   Vaping status: Never Used  Substance and Sexual Activity   Alcohol  use: Not Currently    Alcohol /week: 2.0 standard drinks of alcohol     Types: 2 Standard drinks or equivalent per week   Drug use: No   Sexual activity: Not on file  Other Topics Concern   Not on file  Social History Narrative   Retired- [employer]      Right handed    Wear glasses    Drinks green tea daily    Social Drivers  of Health   Tobacco Use: Low Risk (04/05/2024)   Patient History    Smoking Tobacco Use: Never    Smokeless Tobacco Use: Never    Passive Exposure: Past  Financial Resource Strain: Not on file  Food Insecurity: No Food Insecurity (04/03/2024)   Epic    Worried About Programme Researcher, Broadcasting/film/video in the Last Year: Never true    Ran Out of Food in the Last Year: Never true  Transportation Needs: No Transportation Needs (04/03/2024)   Epic    Lack of  Transportation (Medical): No    Lack of Transportation (Non-Medical): No  Physical Activity: Not on file  Stress: Not on file  Social Connections: Socially Integrated (04/03/2024)   Social Connection and Isolation Panel    Frequency of Communication with Friends and Family: More than three times a week    Frequency of Social Gatherings with Friends and Family: More than three times a week    Attends Religious Services: More than 4 times per year    Active Member of Clubs or Organizations: Yes    Attends Banker Meetings: More than 4 times per year    Marital Status: Married  Depression (PHQ2-9): Low Risk (11/07/2023)   Depression (PHQ2-9)    PHQ-2 Score: 0  Alcohol  Screen: Not on file  Housing: Low Risk (04/03/2024)   Epic    Unable to Pay for Housing in the Last Year: No    Number of Times Moved in the Last Year: 0    Homeless in the Last Year: No  Utilities: Not At Risk (04/03/2024)   Epic    Threatened with loss of utilities: No  Health Literacy: Not on file   Family History  Problem Relation Age of Onset   Diabetes Mother    Diabetes Father    Lung cancer Father        smoker   Diabetes Sister    Diabetes Brother    Colon polyps Neg Hx    Colon cancer Neg Hx    Rectal cancer Neg Hx    Stomach cancer Neg Hx    Esophageal cancer Neg Hx    Scheduled Meds:  acidophilus  1 capsule Oral Daily   vitamin C   1,000 mg Oral Daily   atropine   1 drop Both Eyes Daily   darbepoetin (ARANESP ) injection - NON-DIALYSIS  200 mcg Subcutaneous Q Wed-1800   docusate sodium   200 mg Oral BID   feeding supplement (GLUCERNA SHAKE)  237 mL Oral TID BM   ferrous sulfate   325 mg Oral Daily   folic acid   1 mg Oral Daily   insulin  aspart  0-9 Units Subcutaneous TID WC   insulin  glargine  16 Units Subcutaneous QHS   midodrine   10 mg Oral TID WC   nutrition supplement (JUVEN)  1 packet Oral BID BM   nystatin   5 mL Mouth/Throat QID   pantoprazole   40 mg Oral BID   polyethylene  glycol  17 g Oral BID   prednisoLONE  acetate  1 drop Both Eyes QID   tamsulosin   0.4 mg Oral Daily   zinc  sulfate (50mg  elemental zinc )  220 mg Oral Daily   Continuous Infusions: PRN Meds:.acetaminophen , chlorproMAZINE  (THORAZINE ) injection, dextrose , hydrALAZINE , HYDROmorphone  (DILAUDID ) injection, labetalol , metoCLOPramide  (REGLAN ) injection, ondansetron  (ZOFRAN ) IV, oxyCODONE , phenol Medications Prior to Admission:  Prior to Admission medications  Medication Sig Start Date End Date Taking? Authorizing Provider  amLODipine  (NORVASC ) 5 MG tablet Take 5 mg by mouth daily.  06/22/22  Yes [provider]  atropine  1 % ophthalmic solution Place 1 drop into both eyes daily. 12/27/23  Yes   insulin  aspart protamine - aspart (NOVOLOG  MIX 70/30 FLEXPEN) (70-30) 100 UNIT/ML FlexPen Inject 20-25 Units into the skin See admin instructions. Inject 25 units into the skin with breakfast and 20 units with dinner.   Yes [provider]  prednisoLONE  acetate (PRED FORTE ) 1 % ophthalmic suspension Place 1 drop into both eyes 4 (four) times daily. 12/27/23  Yes   tadalafil  (CIALIS ) 5 MG tablet Take 1 tablet (5 mg total) by mouth daily as needed. 11/29/23  Yes   amoxicillin  (AMOXIL ) 500 MG capsule Take 1 capsule (500 mg total) by mouth 3 (three) times daily. Patient not taking: Reported on 04/03/2024 03/27/24   Nivia Colon, PA-C  benzonatate  (TESSALON ) 100 MG capsule Take 1 capsule (100 mg total) by mouth every 8 (eight) hours. Patient not taking: Reported on 04/03/2024 03/27/24   Nivia Colon, PA-C  doxycycline  (VIBRAMYCIN ) 100 MG capsule Take 1 capsule (100 mg total) by mouth 2 (two) times daily. Patient not taking: Reported on 04/03/2024 03/27/24   Nivia Colon, PA-C   Allergies[1] Review of Systems  Constitutional:  Positive for activity change, appetite change and fatigue.  Respiratory:  Negative for shortness of breath.   Gastrointestinal:  Positive for nausea and vomiting.  All other systems reviewed  and are negative.   Physical Exam Vitals and nursing note reviewed.  Constitutional:      General: He is not in acute distress.    Appearance: He is ill-appearing.  Pulmonary:     Effort: Pulmonary effort is normal. No respiratory distress.  Skin:    General: Skin is warm and dry.  Neurological:     Mental Status: He is disoriented and confused.     Motor: Weakness present.  Psychiatric:        Mood and Affect: Affect is angry.        Behavior: Behavior is agitated.        Cognition and Memory: Memory normal. Cognition is impaired.        Judgment: Judgment is impulsive.     Vital Signs: BP (!) 110/58 (BP Location: Left Arm)   Pulse 91   Temp (!) 97.3 F (36.3 C) (Temporal)   Resp 14   Ht 5' 10 (1.778 m)   Wt 65.8 kg   SpO2 100%   BMI 20.81 kg/m  Pain Scale: 0-10   Pain Score: 0-No pain   SpO2: SpO2: 100 % O2 Device:SpO2: 100 % O2 Flow Rate: .O2 Flow Rate (L/min): 2 L/min  IO: Intake/output summary:  Intake/Output Summary (Last 24 hours) at 04/15/2024 1024 Last data filed at 04/15/2024 0200 Gross per 24 hour  Intake --  Output 650 ml  Net -650 ml    LBM: Last BM Date : 04/12/24 Baseline Weight: Weight: 65.8 kg Most recent weight: Weight: 65.8 kg     Palliative Assessment/Data: PPS 10-20%    Billing based on MDM: High  Problems Addressed: One acute or chronic illness or injury that poses a threat to life or bodily function  Amount and/or Complexity of Data: Category 1:Review of prior external note(s) from each unique source, Review of the result(s) of each unique test, and Assessment requiring an independent historian(s) and Category 3:Discussion of management or test interpretation with external physician/other qualified health care professional/appropriate source (not separately reported)  Risks: Parenteral controlled substances   Signed by: Jeoffrey CHRISTELLA Sharps, NP  Please contact Palliative Medicine Team phone at [phone] for questions and  concerns.  For individual provider: See Amion      [1]  Allergies Allergen Reactions   Flexeril  [Cyclobenzaprine ] Other (See Comments)    [place]. Hallucinations    "

## 2024-04-15 NOTE — Progress Notes (Signed)
 "                                                                                                                                                                                                                                                                                PROGRESS NOTE     Patient Demographics:    Gene Fletcher, is a 74 y.o. male, DOB - [birth year], FMW:[id number]  Outpatient Primary MD for the patient is [name], MD    LOS - 12  Admit date - 04/03/2024    Chief Complaint  Patient presents with   Foot Pain       Brief Narrative (HPI from H&P)     74 y.o. male with a history of hypertension, hyperlipidemia, diabetes mellitus type 2, CKD stage IV, lung cancer s/p radiation, chronic anemia.  Patient presented secondary to a left foot wound with evidence of infection and dry gangrene. Empiric antibiotics started. 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  Patient has stabilized on antibiotics, but patient remains with significant anemia.  As well with REMIC symptoms including poor appetite, nausea and vomiting, patient is Jehovah's Witness, who requested no blood product transfusion, and nontransfusion management, unfortunately continues to have significant worsening symptomatic anemia despite nontransfusion management with high-dose Aranesp , B12, folic acid  and iron , he continues to decline, discussed with daughter at bedside today, and daughter by phone, at this point he is DNR, and they were inquiring about comfort care measures so palliative medicine were consulted.      Subjective:   More somnolent today, himself denies any complaints, he keeps saying  I am good  but he is  confused, he has poor oral intake, still with nausea and vomiting.     Assessment  & Plan :   Diabetic foot infection, wet gangrene of the right foot  - Patient with wet gangrene of the second, third, fourth and fifth digits of right foot.  - Orthopedic input  Dr. 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 -Treated with broad-spectrum antibiotic, stop date 04/08/2024  Anemia Chronic blood loss anemia Anemia of acute illness Anemia of chronic blood loss -  Multifactorial anemia, due to anemia of chronic kidney disease, anemia of chronic blood loss as well in the setting of recent surgery. 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 iron , B12 and folic acid . - Minimize blood draws. - Hold aspirin , continue with SCDs for DVT prophylaxis, no pharmacologic prophylaxis. - On p.o. iron .   - Have discussed with patient/brother at bedside, patient is at increased risk for multiple complications from his profound anemia including acute ischemic event including stroke or  limb ischemia or worsening renal function or heart disease, has been on bedrest, trying to minimize his activity, with his significant anemia, but managed to get out of bed to chair for couple days. - Worsening hemoglobin at 4.7 this morning.  Nausea/vomiting Failure to thrive/poor appetite Uremia -. Patient with significant nausea/vomiting, as discussed with patient/brother at bedside, this has been ongoing even before admission to the hospital, so it is very likely related to his uremia, for now we will continue with as needed nausea meds.   -Continue with as needed nausea meds for now -Abdominal x-ray with no acute finding, concerning for some mild constipation, started on MiraLAX . -These findings most likely related to uremia as they have been present even before his hospitalization   DKA patient with DM type II  -DKA likely precipitated by foot gangrene,. -initially was on insulin  drip, currently transitioned to subcu insulin , CBG appears better improved on Semglee  16 units.    Lab Results  Component Value Date   HGBA1C 9.2 (H) 04/03/2024   CBG (last  3)  Recent Labs    04/14/24 2236 04/15/24 0159 04/15/24 0747  GLUCAP 143* 231* 170*     Leukocytosis   -Secondary to foot infection, resolved completely, off antibiotics 04/08/2024   Primary hypertension  - blood pressure low due to anemia on 04/06/2024, blood pressure medications held, as needed hydralazine  only, midodrine  added for now.   History of lung cancer with nonspecific right-sided chest x-ray changes.  Follow-up with PCP and primary oncologist postdischarge.   Memory impairment  -Concern from family. Recommend outpatient neuropsych testing/evaluation.   Mild hypernatremia, CKD stage IV, hypokalemia - Baseline creatinine appears to be around 2.2 to 2.4.  Potassium replaced.  Renal function stable to improving.   Moderate bilateral PAD.  Glycemic control, check lipid panel tomorrow, will place on aspirin , statin if needed, outpatient follow-up with vascular surgery.  Urinary retention.  On Flomax , In-N-Out catheter, if retention becomes recurrent then Foley.  Acute metabolic/uremic encephalopathy - With mild cognitive decline as an outpatient, but during hospital stay more confused, recently more lethargic less interactive, this is most likely in the setting of sepsis on presentation, anemia and uremia   Goals of care -Discussed with brother at bedside, and daughter by phone, at this point family agreeable to DNR, no heroics, they understand overall poor prognosis as he continues to decline, with poor oral intake due to uremia and CKD, not a dialysis candidate, likely has poor functional and cognitive status, malnutrition with poor oral intake, discussed with family that we will consult palliative medicine to discuss goals of care beyond DNR if he continues to decline and possible comfort care.       Condition -   Guarded  Family Communication  : discussed with brother at bedside this morning and daughter by phone  Code Status : DNR  Consults  : orthopedic Dr. Harden,  palliative medicine  PUD Prophylaxis : PPI   Procedures  :     ABI.  Moderate bilateral disease discussed with Dr. Harden.      Disposition Plan  :    Status is: Inpatient   DVT Prophylaxis  :    SCD's Start: 04/05/24 1611 SCD's Start: 04/05/24 1611 SCDs Start: 04/03/24 1433    Lab Results  Component Value Date   PLT 219 04/15/2024    Diet :  Diet Order             DIET - DYS 1 Room service appropriate? Yes; Fluid consistency: Thin  Diet effective now                    Inpatient Medications  Scheduled Meds:  acidophilus  1 capsule Oral Daily   vitamin C   1,000 mg Oral Daily   atropine   1 drop Both Eyes Daily   darbepoetin (ARANESP ) injection - NON-DIALYSIS  200 mcg Subcutaneous Q Wed-1800   docusate sodium   200 mg Oral BID   feeding supplement (GLUCERNA SHAKE)  237 mL Oral TID BM   ferrous sulfate   325 mg Oral Daily   folic acid   1 mg Oral Daily   insulin  aspart  0-9 Units Subcutaneous TID WC   insulin  glargine  16 Units Subcutaneous QHS   midodrine   10 mg Oral TID WC   nutrition supplement (JUVEN)  1 packet Oral BID BM   nystatin   5 mL Mouth/Throat QID   pantoprazole   40 mg Oral BID   polyethylene glycol  17 g Oral BID   prednisoLONE  acetate  1 drop Both Eyes QID   tamsulosin   0.4 mg Oral Daily   zinc  sulfate (50mg  elemental zinc )  220 mg Oral Daily   Continuous Infusions:   PRN Meds:.acetaminophen , chlorproMAZINE  (THORAZINE ) injection, dextrose , hydrALAZINE , HYDROmorphone  (DILAUDID ) injection, labetalol , metoCLOPramide  (REGLAN ) injection, ondansetron  (ZOFRAN ) IV, oxyCODONE , phenol   Objective:   Vitals:   04/15/24 0000 04/15/24 0152 04/15/24 0400 04/15/24 0748  BP: 118/60 119/60 115/60 (!) 110/58  Pulse: (!) 108 (!) 102 89 91  Resp: 20 (!) 25 18 14   Temp:  99.8 F (37.7 C)  (!) 97.3 F (36.3 C)  TempSrc:  Oral  Temporal  SpO2: 100% 99% 99% 100%  Weight:      Height:        Wt Readings from Last 3 Encounters:  04/05/24 65.8 kg   04/02/24 65.8 kg  03/26/24 69.9 kg     Intake/Output Summary (Last 24 hours) at 04/15/2024 0947 Last data filed at 04/15/2024 0200 Gross per 24 hour  Intake --  Output 650 ml  Net -650 ml     Physical Exam  Somnolent this morning, impaired judgment and insight, repetitive, extremely frail and deconditioned  No wheezing  Abdomen soft  Regular rate and rhythm  Right BKA with wound VAC, left foot with no gangrene or cyanosis, connected to SCDs.   Data Review:    Recent Labs  Lab 04/09/24 0223 04/10/24 0308 04/11/24 0232 04/12/24 0744 04/13/24 0547 04/14/24 0538 04/15/24 0232  WBC 7.9 11.3*  --   --   --   --  9.7  HGB 6.0* 5.8* 5.3* 5.2* 5.9* 5.4* 4.7*  HCT 19.0* 18.7* 16.7* 16.3* 18.8* 17.1* 15.0*  PLT 209 235  --   --   --   --  219  MCV 100.5* 100.0  --   --   --   --  106.4*  MCH 31.7  31.0  --   --   --   --  33.3  MCHC 31.6 31.0  --   --   --   --  31.3  RDW 12.9 12.9  --   --   --   --  18.0*  LYMPHSABS 0.3* 0.3*  --   --   --   --   --   MONOABS 0.6 0.8  --   --   --   --   --   EOSABS 0.0 0.1  --   --   --   --   --   BASOSABS 0.0 0.0  --   --   --   --   --     Recent Labs  Lab 04/09/24 0223 04/15/24 0232  NA 137 145  K 4.0 4.2  CL 107 111  CO2 22 26  ANIONGAP 8 8  GLUCOSE 186* 258*  BUN 66* 61*  CREATININE 2.35* 2.45*  CALCIUM 8.3* 8.9      Recent Labs  Lab 04/09/24 0223 04/15/24 0232  CALCIUM 8.3* 8.9    --------------------------------------------------------------------------------------------------------------- Lab Results  Component Value Date   CHOL 108 04/06/2024   HDL 35 (L) 04/06/2024   LDLCALC 62 04/06/2024   TRIG 59 04/06/2024   CHOLHDL 3.1 04/06/2024    Lab Results  Component Value Date   HGBA1C 9.2 (H) 04/03/2024   No results for input(s): TSH, T4TOTAL, FREET4, T3FREE, THYROIDAB in the last 72 hours. No results for input(s): VITAMINB12, FOLATE, FERRITIN, TIBC, IRON , RETICCTPCT in the last  72 hours.  ------------------------------------------------------------------------------------------------------------------ Cardiac Enzymes No results for input(s): CKMB, TROPONINI, MYOGLOBIN in the last 168 hours.  Invalid input(s): CK  Micro Results No results found for this or any previous visit (from the past 240 hours).   Radiology Report No results found.     Signature  -   Brayton Lye M.D on 04/15/2024 at 9:47 AM   -  To page go to [email or web address]   "

## 2024-04-15 NOTE — Inpatient Diabetes Management (Signed)
 Inpatient Diabetes Program Recommendations  AACE/ADA: New Consensus Statement on Inpatient Glycemic Control   Target Ranges:  Prepandial:   less than 140 mg/dL      Peak postprandial:   less than 180 mg/dL (1-2 hours)      Critically ill patients:  140 - 180 mg/dL   Lab Results  Component Value Date   GLUCAP 170 (H) 04/15/2024   HGBA1C 9.2 (H) 04/03/2024    Latest Reference Range & Units 04/14/24 08:21 04/14/24 11:43 04/14/24 16:53 04/14/24 22:36 04/15/24 01:59 04/15/24 07:47  Glucose-Capillary 70 - 99 mg/dL 831 (H) 841 (H) 787 (H) 143 (H) 231 (H) 170 (H)   Review of Glycemic Control  Diabetes history: DM 2  Outpatient Diabetes medications:  Novolog  70/30- 25 units q AM and 20 units q PM  Current orders for Inpatient glycemic control:  Novolog  0-9 units tid with meals Lantus  16 units q HS   Inpatient Diabetes Program Recommendations:     May consider adding meal coverage- Novolog  2 units TID with meals (hold if patient eats less than 50% or NPO).   Thanks,  Lavanda Search, RN, MSN, [facility]  Inpatient Diabetes Coordinator  Pager [phone] (8a-5p)

## 2024-04-16 DIAGNOSIS — E11628 Type 2 diabetes mellitus with other skin complications: Secondary | ICD-10-CM | POA: Diagnosis not present

## 2024-04-16 DIAGNOSIS — L089 Local infection of the skin and subcutaneous tissue, unspecified: Secondary | ICD-10-CM | POA: Diagnosis not present

## 2024-04-16 MED ORDER — METOCLOPRAMIDE HCL 5 MG/ML IJ SOLN
5.0000 mg | Freq: Four times a day (QID) | INTRAMUSCULAR | Status: DC
  Administered 2024-04-16 – 2024-04-20 (×14): 5 mg via INTRAVENOUS
  Filled 2024-04-16 (×15): qty 2

## 2024-04-16 MED ORDER — BISACODYL 10 MG RE SUPP
10.0000 mg | Freq: Once | RECTAL | Status: AC
  Filled 2024-04-16: qty 1

## 2024-04-16 NOTE — TOC Progression Note (Signed)
 Transition of Care [facility]) - Progression Note    Patient Details  Name: Gene Fletcher MRN: [id number] Date of Birth: 09/24/1950  [facility] CM/SW Contact  Inocente GORMAN Kindle, LCSW Phone Number: 04/16/2024, 12:49 PM  Clinical Narrative:    Patient has transitioned to comfort care in house. ICM continues to be available if needs arise.    Expected Discharge Plan:  (comfort care) Barriers to Discharge: Continued Medical Work up (comfort care in hospital)               Expected Discharge Plan and Services In-house Referral: Clinical Social Work   Post Acute Care Choice: Skilled Nursing Facility Living arrangements for the past 2 months: Single Family Home                                       Social Drivers of Health (SDOH) Interventions SDOH Screenings   Food Insecurity: No Food Insecurity (04/03/2024)  Housing: Low Risk (04/03/2024)  Transportation Needs: No Transportation Needs (04/03/2024)  Utilities: Not At Risk (04/03/2024)  Depression (PHQ2-9): Low Risk (11/07/2023)  Social Connections: Socially Integrated (04/03/2024)  Tobacco Use: Low Risk (04/05/2024)    Readmission Risk Interventions     No data to display

## 2024-04-16 NOTE — Progress Notes (Signed)
 "                        PROGRESS NOTE        PATIENT DETAILS Name: Gene Fletcher Age: 74 y.o. Sex: male Date of Birth: 16-Dec-1950 Admit Date: 04/03/2024 Admitting Physician [name], MD [name], [name], MD  Brief Summary: Patient is a 74 y.o.  male with history of HTN, HLD, DM-2, CKD 4, lung cancer-s/p radiation, chronic anemia-presented with left foot wound infection with wet gangrene-evaluated by orthopedics-underwent left BKA-further hospital course complicated by worsening anemia-failure to thrive syndrome-uremia-patient is a [employer] refused blood products.  Unfortunately-even with an Aranesp /B12/folic acid /iron -he continued to decline-after discussion with family-he was transition to full comfort measures.  Significant events: 1/14>> admit to [facility]. 1/16>> right BKA 1/26>> full comfort measures.  Significant studies: 1/14>> CXR: Right midlung nodular density. 1/14>> x-ray right foot: Mid and forefoot necrotizing fasciitis-no evidence of osteomyelitis. 1/16>> x-ray abdomen: No bowel obstruction.  Significant microbiology data: None  Procedures: 1/16>> right BKA  Consults: Orthopedics Palliative care  Subjective: Lying comfortably in bed-denies any chest pain or shortness of breath.  Objective: Vitals: Blood pressure (!) 122/59, pulse 96, temperature 97.8 F (36.6 C), temperature source Oral, resp. rate (!) 31, height 5' 10 (1.778 m), weight 65.8 kg, SpO2 98%.   Exam: Gen Exam:Alert awake-not in any distress HEENT:atraumatic, normocephalic Chest: B/L clear to auscultation anteriorly CVS:S1S2 regular Abdomen:soft non tender, non distended Extremities:no edema Neurology: Non focal Skin: no rash  Pertinent Labs/Radiology:    Latest Ref Rng & Units 04/15/2024    2:32 AM 04/14/2024    5:38 AM 04/13/2024    5:47 AM  CBC  WBC 4.0 - 10.5 K/uL 9.7     Hemoglobin 13.0 - 17.0 g/dL 4.7  5.4  5.9   Hematocrit 39.0 - 52.0 % 15.0  17.1   18.8   Platelets 150 - 400 K/uL 219       Lab Results  Component Value Date   NA 145 04/15/2024   K 4.2 04/15/2024   CL 111 04/15/2024   CO2 26 04/15/2024      Assessment/Plan: Diabetic foot infection with wet gangrene of right foot S/p right BKA by Dr. Harden antibiotics were stopped 1/19  Multifactorial anemia Secondary to anemia of CKD, acute illness, blood loss from recent surgery Jehovah's Witness-refused PRBC transfusion Unfortunately-Hb continued to worsen in spite of Aranesp /IV iron /vitamin B12/folic acid  and minimization of blood draws-patient/family was aware of life-threatening and life disabling consequences-he became more symptomatic as his anemia worsened. After extensive discussion by prior physician/palliative care team-transition to full comfort measures on 1/26.  Nausea/vomiting-poor appetite Unfortunately-ongoing even prior to this hospitalization but worsened during this admission-abdominal x-rays were negative for SBO-but did show some stool burden-placed on MiraLAX /laxatives-without any significant improvement-per prior notes-this is thought to be now secondary to uremia.  DKA History of DM-2 Previously on insulin  infusion-subsequently transition to SQ insulin -currently comfort measures in effect.  Acute metabolic encephalopathy Secondary to diabetic foot/DKA/uremia-suspicion for some amount of cognitive issues as well. Supportive care.  HTN No longer on any antihypertensives-in fact blood pressure was low-requiring addition of midodrine .  History of PAD Supportive care  History of lung cancer with nonspecific right-sided x-ray changes. Supportive care  Goals of care Extensive discussion done by prior physician/palliative care team-due to continued decline-poor oral intake-persistent nausea/vomiting-made DNR-and subsequently transition to full comfort measures.  Inpatient death anticipated  Pressure Ulcer: Agree with assessment as outlined  below. Wound 04/14/24 0400 Pressure Injury Sacrum Mid;Left Deep Tissue Pressure Injury - Purple or maroon localized area of discolored intact skin or blood-filled blister due to damage of underlying soft tissue from pressure and/or shear. (Active)     Code status:   Code Status: Do not attempt resuscitation (DNR) - Comfort care   DVT Prophylaxis:***      Family Communication: None at bedside   Disposition Plan: Status is: Inpatient {Inpatient:23812}   Planned Discharge Destination:{Plan; Disposition:26390}   Diet: Diet Order             Diet regular Room service appropriate? Yes; Fluid consistency: Thin  Diet effective now                     Antimicrobial agents: Anti-infectives (From admission, onward)    Start     Dose/Rate Route Frequency Ordered Stop   04/05/24 1500  vancomycin  (VANCOCIN ) IVPB 1000 mg/200 mL premix  Status:  Discontinued        1,000 mg 200 mL/hr over 60 Minutes Intravenous Every 48 hours 04/03/24 1458 04/04/24 1059   04/05/24 0100  vancomycin  (VANCOCIN ) IVPB 1000 mg/200 mL premix  Status:  Discontinued        1,000 mg 200 mL/hr over 60 Minutes Intravenous Every 36 hours 04/04/24 1059 04/04/24 1349   04/04/24 1400  linezolid  (ZYVOX ) tablet 600 mg        600 mg Oral Every 12 hours 04/04/24 1349 04/08/24 1821   04/03/24 2000  metroNIDAZOLE  (FLAGYL ) IVPB 500 mg        500 mg 100 mL/hr over 60 Minutes Intravenous Every 12 hours 04/03/24 1448 04/08/24 2108   04/03/24 1800  ceFEPIme  (MAXIPIME ) 2 g in sodium chloride  0.9 % 100 mL IVPB        2 g 200 mL/hr over 30 Minutes Intravenous Every 24 hours 04/03/24 1454 04/08/24 1859   04/03/24 1145  piperacillin -tazobactam (ZOSYN ) IVPB 3.375 g        3.375 g 100 mL/hr over 30 Minutes Intravenous  Once 04/03/24 1132 04/03/24 1226   04/03/24 1130  vancomycin  (VANCOCIN ) IVPB 1000 mg/200 mL premix  Status:  Discontinued        1,000 mg 200 mL/hr over 60 Minutes Intravenous  Once 04/03/24 1121 04/03/24  1122   04/03/24 1130  vancomycin  (VANCOREADY) IVPB 1500 mg/300 mL        1,500 mg 150 mL/hr over 120 Minutes Intravenous  Once 04/03/24 1122 04/03/24 1456        MEDICATIONS: Scheduled Meds:  antiseptic oral rinse  15 mL Topical BID   bisacodyl   10 mg Rectal Once   LORazepam   0.5 mg Intravenous Q6H   metoCLOPramide  (REGLAN ) injection  5 mg Intravenous Q6H   nystatin   5 mL Mouth/Throat QID   prochlorperazine   10 mg Intravenous Q6H   tamsulosin   0.4 mg Oral Daily   Continuous Infusions: PRN Meds:.acetaminophen  **OR** acetaminophen , artificial tears, chlorproMAZINE  (THORAZINE ) injection, diphenhydrAMINE , feeding supplement (GLUCERNA SHAKE), glycopyrrolate  **OR** glycopyrrolate  **OR** glycopyrrolate , haloperidol  **OR** haloperidol  **OR** haloperidol  lactate, HYDROmorphone  (DILAUDID ) injection, LORazepam  **OR** LORazepam  **OR** LORazepam , metoCLOPramide  (REGLAN ) injection, ondansetron  **OR** ondansetron  (ZOFRAN ) IV, oxyCODONE , phenol   I have personally reviewed following labs and imaging studies  LABORATORY DATA: CBC: Recent Labs  Lab 04/10/24 0308 04/11/24 0232 04/12/24 0744 04/13/24 0547 04/14/24 0538 04/15/24 0232  WBC 11.3*  --   --   --   --  9.7  NEUTROABS 10.0*  --   --   --   --   --  HGB 5.8* 5.3* 5.2* 5.9* 5.4* 4.7*  HCT 18.7* 16.7* 16.3* 18.8* 17.1* 15.0*  MCV 100.0  --   --   --   --  106.4*  PLT 235  --   --   --   --  219    Basic Metabolic Panel: Recent Labs  Lab 04/15/24 0232  NA 145  K 4.2  CL 111  CO2 26  GLUCOSE 258*  BUN 61*  CREATININE 2.45*  CALCIUM 8.9    GFR: Estimated Creatinine Clearance: 24.6 mL/min (A) (by C-G formula based on SCr of 2.45 mg/dL (H)).  Liver Function Tests: No results for input(s): AST, ALT, ALKPHOS, BILITOT, PROT, ALBUMIN in the last 168 hours. No results for input(s): LIPASE, AMYLASE in the last 168 hours. No results for input(s): AMMONIA in the last 168 hours.  Coagulation Profile: No  results for input(s): INR, PROTIME in the last 168 hours.  Cardiac Enzymes: No results for input(s): CKTOTAL, CKMB, CKMBINDEX, TROPONINI in the last 168 hours.  BNP (last 3 results) No results for input(s): PROBNP in the last 8760 hours.  Lipid Profile: No results for input(s): CHOL, HDL, LDLCALC, TRIG, CHOLHDL, LDLDIRECT in the last 72 hours.  Thyroid  Function Tests: No results for input(s): TSH, T4TOTAL, FREET4, T3FREE, THYROIDAB in the last 72 hours.  Anemia Panel: No results for input(s): VITAMINB12, FOLATE, FERRITIN, TIBC, IRON , RETICCTPCT in the last 72 hours.  Urine analysis:    Component Value Date/Time   COLORURINE YELLOW 04/03/2024 1534   APPEARANCEUR HAZY (A) 04/03/2024 1534   LABSPEC 1.018 04/03/2024 1534   PHURINE 5.0 04/03/2024 1534   GLUCOSEU >=500 (A) 04/03/2024 1534   HGBUR SMALL (A) 04/03/2024 1534   BILIRUBINUR NEGATIVE 04/03/2024 1534   KETONESUR 5 (A) 04/03/2024 1534   PROTEINUR 100 (A) 04/03/2024 1534   NITRITE NEGATIVE 04/03/2024 1534   LEUKOCYTESUR NEGATIVE 04/03/2024 1534    Sepsis Labs: Lactic Acid, Venous    Component Value Date/Time   LATICACIDVEN 1.9 04/03/2024 1205    MICROBIOLOGY: No results found for this or any previous visit (from the past 240 hours).  RADIOLOGY STUDIES/RESULTS: No results found.   LOS: 13 days   Donalda Applebaum, MD  [facility]    To contact the attending provider between 7A-7P or the covering provider during after hours 7P-7A, please log into the web site [email or web address] and access using universal Maribel password for that web site. If you do not have the password, please call the hospital operator.  04/16/2024, 8:35 PM    "

## 2024-04-16 NOTE — Plan of Care (Signed)
" °  Problem: Metabolic: Goal: Ability to maintain appropriate glucose levels will improve Outcome: Progressing   Problem: Fluid Volume: Goal: Ability to achieve a balanced intake and output will improve Outcome: Progressing   Problem: Education: Goal: Knowledge of General Education information will improve Description: Including pain rating scale, medication(s)/side effects and non-pharmacologic comfort measures Outcome: Progressing   "

## 2024-04-16 NOTE — Progress Notes (Signed)
 "                                                                                                                                                                                                         Daily Progress Note   Patient Name: Gene Fletcher       Date: 04/16/2024 DOB: 1950/09/13  Age: 74 y.o. MRN#: [id number] Attending Physician: Sherlon Brayton RAMAN, MD Primary Care Physician: [name], MD Admit Date: 04/03/2024  Reason for Consultation/Follow-up: Non pain symptom management, Pain control, Psychosocial/spiritual support, and Terminal Care  Objective: I have reviewed medical records including: EPIC notes: TOC, hospitalist, nursing.  MAR: Comfort medications per MAR.  As needed medications administered in the last 24 hours-Reglan  x 2.  Use of as needed nausea medications have significantly decreased since yesterday. Intake/output: last documented BM 1/18  Subjective: Received report from primary RN - no acute concerns. Per RN, patient lost IV access this morning.   Went to visit patient at bedside -daughter/Bernel and brother/[name] present along with primary RN administering medications.  Patient is lying in bed intermittently awake/asleep. No signs or non-verbal gestures of pain or discomfort noted. No respiratory distress, increased work of breathing, or secretions noted.  Patient states I am feeling good.  Patient reports improved nausea today.  Family feel patient's nausea is consistent.  Patient/family deny bowel movement.  Patient is more calm today.  He denies pain.  Emotional support provided to patient and family.  We discussed replacing IV, which they prefer to continue to administer current comfort regimen.  Per family, Reglan  does better than zofran  for patient.  We discussed scheduling Reglan  every 6 hours in between the scheduled Compazine  -family felt that this would help and are agreeable.  Will also order another one-time dose of bisacodyl   suppository, though with improvement of nausea with antiemetics nausea/vomiting is likely more uremic and not constipation related.  Patient's oral intake has also been poor, may not have as frequent bowel movements.   Family question if patient will continue to receive wound care.  Education provided the palliative wound care is an important part of comfort care and wound care orders are still active.  Family also are hopeful for patient to feel the sun -discussed rolling the bed closer to the window so he can see outside and feel the sun.  All questions and concerns addressed. Encouraged to call with questions and/or concerns. PMT card previously provided.  Length of Stay: 13  Current Medications: Scheduled Meds:   antiseptic oral rinse  15 mL Topical BID  atropine   1 drop Both Eyes Daily   LORazepam   0.5 mg Intravenous Q6H   nystatin   5 mL Mouth/Throat QID   prednisoLONE  acetate  1 drop Both Eyes QID   prochlorperazine   10 mg Intravenous Q6H   tamsulosin   0.4 mg Oral Daily    Continuous Infusions:   PRN Meds: acetaminophen  **OR** acetaminophen , artificial tears, chlorproMAZINE  (THORAZINE ) injection, diphenhydrAMINE , feeding supplement (GLUCERNA SHAKE), glycopyrrolate  **OR** glycopyrrolate  **OR** glycopyrrolate , haloperidol  **OR** haloperidol  **OR** haloperidol  lactate, HYDROmorphone  (DILAUDID ) injection, LORazepam  **OR** LORazepam  **OR** LORazepam , metoCLOPramide  (REGLAN ) injection, ondansetron  **OR** ondansetron  (ZOFRAN ) IV, oxyCODONE , phenol  Physical Exam Vitals and nursing note reviewed.  Constitutional:      General: He is not in acute distress.    Appearance: He is ill-appearing.  Pulmonary:     Effort: Pulmonary effort is normal. No respiratory distress.  Skin:    General: Skin is warm and dry.  Neurological:     Mental Status: He is alert.     Motor: Weakness present.  Psychiatric:        Behavior: Behavior is cooperative.             Vital Signs: BP (!) 122/59  (BP Location: Left Arm)   Pulse 96   Temp 97.8 F (36.6 C) (Oral)   Resp (!) 31   Ht 5' 10 (1.778 m)   Wt 65.8 kg   SpO2 98%   BMI 20.81 kg/m  SpO2: SpO2: 98 % O2 Device: O2 Device: Room Air O2 Flow Rate: O2 Flow Rate (L/min): 2 L/min  Intake/output summary:  Intake/Output Summary (Last 24 hours) at 04/16/2024 9092 Last data filed at 04/16/2024 0500 Gross per 24 hour  Intake --  Output 1850 ml  Net -1850 ml   LBM: Last BM Date : 04/12/24 Baseline Weight: Weight: 65.8 kg Most recent weight: Weight: 65.8 kg       Palliative Assessment/Data: PPS 10-20%      Patient Active Problem List   Diagnosis Date Noted   Gangrene of right foot (HCC) 04/03/2024   No transfusions per religious beliefs (Jehovahs Witnesses) 04/03/2024   Diabetic foot infection (HCC) 04/03/2024   DKA (diabetic ketoacidosis) (HCC) 04/03/2024   Malignant neoplasm of lower lobe of right lung (HCC) 11/07/2023   Malignant 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) 10/16/2023   Lung nodules 10/04/2023   Adenopathy 10/04/2023   Insulin  long-term use (HCC) 07/04/2022   Leg edema 07/04/2022   Chronic kidney disease, stage 4 (severe) (HCC) 07/04/2022   Noncompliance with treatment 02/12/2018   Anemia 07/06/2015   Diabetic renal disease (HCC) 12/23/2014   Encounter for general adult medical examination without abnormal findings 12/16/2014   Diabetic retinopathy associated with type 2 diabetes mellitus (HCC) 12/18/2013   Type 2 diabetes mellitus without complications (HCC) 09/24/2009   Hyperlipidemia 09/24/2009   Hyperglycemia due to type 2 diabetes mellitus (HCC) 09/24/2009   Essential hypertension 09/24/2009   Encounter for other general examination 09/24/2009    Palliative Care Assessment & Plan   Patient Profile: 74 y.o. male  with past medical history of hypertension, hyperlipidemia, diabetes, CKD 4, lung cancer status post radiation, anemia was admitted on 04/03/2024 with diabetic foot  infection/gangrene/?  Osteomyelitis, DKA, anemia refusing blood products as a Jehovah's Witness.  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  Patient's hospital course was complicated by severe anemia.  All nontransfusion measures were maximized and patient continues to decline; as a Jehovah's Witness he declines blood products.  Prior  and throughout hospitalization patient has also struggled with significant nausea and vomiting, which is likely related to uremia.   Noted TOC has been in contact with patient's APS worker -they indicate family is still patient's surrogate decision makers.  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)   DKA (diabetic ketoacidosis) (HCC)   Terminal care  Recommendations/Plan: Continue full comfort measures Continue DNR/DNI as previously documented Anticipate hospital death-family not interested in hospice transfer Continue palliative wound care Continue current comfort focused medication regimen as noted below - changes noted in bold PMT will continue to follow and support holistically  Symptom Management Dilaudid  IV PRN pain/dyspnea/increased work of breathing/RR>25 Tylenol  PRN pain/fever Biotin twice daily Benadryl  PRN itching Robinul  PRN secretions Haldol  PRN agitation/delirium Continue ativan  every 6 hours; PRN doses for breakthrough anxiety/seizure/sleep/distress Zofran  PRN breakthrough nausea/vomiting Continue Compazine  every 6 hours Scheduled reglan  q6h in between scheduled Compazine ; continue PRN doses for breakthrough nausea/vomiting Liquifilm Tears PRN dry eye Another Bisacodyl   suppository once  Goals of Care and Additional Recommendations: Limitations on Scope of Treatment: Full Comfort Care  Code Status:    Code Status Orders  (From admission, onward)           Start     Ordered   04/15/24 2111  Do not attempt resuscitation (DNR) - Comfort care  (Code Status)  Continuous       Question Answer Comment  If patient has no pulse and is not breathing Do Not Attempt Resuscitation   In Pre-Arrest Conditions (Patient Is Breathing and Has a Pulse) Provide comfort measures. Relieve any mechanical airway obstruction. Avoid transfer unless required for comfort.   Consent: Discussion documented in EHR or advanced directives reviewed      04/15/24 2110           Code Status History     Date Active Date Inactive Code Status Order ID Comments User Context   04/15/2024 1656 04/15/2024 2110 Limited: Do not attempt resuscitation (DNR) -DNR-LIMITED -Do Not Intubate/DNI  [id number]  Claudene Jeoffrey HERO, NP Inpatient   04/15/2024 0759 04/15/2024 1656 Limited: Do not attempt resuscitation (DNR) -DNR-LIMITED -Do Not Intubate/DNI  [id number]  Sherlon Brayton RAMAN, MD Inpatient   04/03/2024 1448 04/15/2024 0759 Full Code [id number]  Seena Marsa NOVAK, MD ED       Prognosis:  Hours - Days  Discharge Planning: Anticipated Hospital Death  Care plan was discussed with primary RN, Dr. Sherlon, patient, patient's family  Thank you for allowing the Palliative Medicine Team to assist in the care of this patient.  Billing based on MDM: High  Problems Addressed: One acute or chronic illness or injury that poses a threat to life or bodily function  Risks: Parenteral controlled substances     Ryan Ogborn HERO Claudene, NP  Please contact Palliative Medicine Team phone at [phone] for questions and concerns.       "

## 2024-04-16 NOTE — Progress Notes (Signed)
 "                                                                                                                                                                                                                                                                                PROGRESS NOTE     Patient Demographics:    Gene Fletcher, is a 74 y.o. male, DOB - [birth year], FMW:[id number]  Outpatient Primary MD for the patient is [name], MD    LOS - 13  Admit date - 04/03/2024    Chief Complaint  Patient presents with   Foot Pain       Brief Narrative (HPI from H&P)     74 y.o. male with a history of hypertension, hyperlipidemia, diabetes mellitus type 2, CKD stage IV, lung cancer s/p radiation, chronic anemia.  Patient presented secondary to a left foot wound with evidence of infection and dry gangrene. Empiric antibiotics started. 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  Patient has stabilized on antibiotics, but patient remains with significant anemia.  As well with REMIC symptoms including poor appetite, nausea and vomiting, patient is Jehovah's Witness, who requested no blood product transfusion, and nontransfusion management, unfortunately continues to have significant worsening symptomatic anemia despite nontransfusion management with high-dose Aranesp , B12, folic acid  and iron , he continues to decline, discussed with daughter at bedside today, and daughter by phone, at this point he is DNR, and they were inquiring about comfort care measures so palliative medicine were consulted.  Patient was transitioned to full comfort measures.    Subjective:   Patient denies any pain today, reports his nausea and vomiting has improved, but upon discussing with staff it still remains significant.     Assessment  & Plan :   Diabetic foot infection, wet gangrene of the right foot  - Patient with wet gangrene of the second, third, fourth and fifth digits of right  foot.  - Orthopedic input Dr. 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 -Treated with broad-spectrum antibiotic, stop date 04/08/2024  Anemia Chronic blood loss anemia Anemia of acute illness Anemia of chronic blood loss -Multifactorial anemia, due  to anemia of chronic kidney disease, anemia of chronic blood loss as well in the setting of recent surgery. 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 iron , B12 and folic acid . - Minimize blood draws. - Hold aspirin , continue with SCDs for DVT prophylaxis, no pharmacologic prophylaxis. - On p.o. iron .   - Have discussed with patient/brother at bedside, patient is at increased risk for multiple complications from his profound anemia including acute ischemic event including stroke or  limb ischemia or worsening renal function or heart disease, has been on bedrest, trying to minimize his activity, with his significant anemia, but managed to get out of bed to chair for couple days. - Worsening hemoglobin at 4.7   Nausea/vomiting Failure to thrive/poor appetite Uremia -. Patient with significant nausea/vomiting, as discussed with patient/brother at bedside, this has been ongoing even before admission to the hospital, so it is very likely related to his uremia, for now we will continue with as needed nausea meds.   -Continue with as needed nausea meds for now -Abdominal x-ray with no acute finding, concerning for some mild constipation, started on MiraLAX . -These findings most likely related to uremia as they have been present even before his hospitalization   DKA patient with DM type II  -DKA likely precipitated by foot gangrene,. -initially was on insulin  drip, currently transitioned to subcu insulin ,   Lab Results  Component Value Date   HGBA1C 9.2 (H) 04/03/2024   CBG (last 3)  Recent Labs    04/15/24 0747  04/15/24 1139 04/15/24 1554  GLUCAP 170* 146* 215*     Leukocytosis   -Secondary to foot infection, resolved completely, off antibiotics 04/08/2024   Primary hypertension  - blood pressure low due to anemia on 04/06/2024, blood pressure medications held, as needed hydralazine  only, midodrine  added for now.   History of lung cancer with nonspecific right-sided chest x-ray changes.     Memory impairment  -Concern from family. Recommend outpatient neuropsych testing/evaluation.   Mild hypernatremia, CKD stage IV, hypokalemia   Moderate bilateral PAD.  Glycemic control, check lipid panel tomorrow, will place on aspirin , statin if needed, outpatient follow-up with vascular surgery.  Urinary retention.    Acute metabolic/uremic encephalopathy - With mild cognitive decline as an outpatient, but during hospital stay more confused, recently more lethargic less interactive, this is most likely in the setting of sepsis on presentation, anemia and uremia   Goals of care -Discussed with brother at bedside, and daughter by phone, at this point family agreeable to DNR, no heroics, they understand overall poor prognosis as he continues to decline, with poor oral intake due to uremia and CKD, not a dialysis candidate, likely has poor functional and cognitive status, malnutrition with poor oral intake, palliative medicine input greatly appreciated, patient currently is full comfort measures, he is on scheduled/as needed medication for his pain, nausea and vomiting.       Condition -   Guarded  Family Communication  : discussed with brother and daughter at bedside  Code Status : DNR/comfort  Consults  : orthopedic Dr. Harden, palliative medicine  PUD Prophylaxis : PPI   Procedures  :     ABI.  Moderate bilateral disease discussed with Dr. Harden.      Disposition Plan  :    Status is: Inpatient   DVT Prophylaxis  :        Lab Results  Component  Value Date   PLT 219 04/15/2024     Diet :  Diet Order             Diet regular Room service appropriate? Yes; Fluid consistency: Thin  Diet effective now                    Inpatient Medications  Scheduled Meds:  antiseptic oral rinse  15 mL Topical BID   bisacodyl   10 mg Rectal Once   LORazepam   0.5 mg Intravenous Q6H   metoCLOPramide  (REGLAN ) injection  5 mg Intravenous Q6H   nystatin   5 mL Mouth/Throat QID   prochlorperazine   10 mg Intravenous Q6H   tamsulosin   0.4 mg Oral Daily   Continuous Infusions:   PRN Meds:.acetaminophen  **OR** acetaminophen , artificial tears, chlorproMAZINE  (THORAZINE ) injection, diphenhydrAMINE , feeding supplement (GLUCERNA SHAKE), glycopyrrolate  **OR** glycopyrrolate  **OR** glycopyrrolate , haloperidol  **OR** haloperidol  **OR** haloperidol  lactate, HYDROmorphone  (DILAUDID ) injection, LORazepam  **OR** LORazepam  **OR** LORazepam , metoCLOPramide  (REGLAN ) injection, ondansetron  **OR** ondansetron  (ZOFRAN ) IV, oxyCODONE , phenol   Objective:   Vitals:   04/15/24 0400 04/15/24 0748 04/15/24 1139 04/16/24 0817  BP: 115/60 (!) 110/58 (!) 130/59 (!) 122/59  Pulse: 89 91 96 96  Resp: 18 14 (!) 31   Temp:  (!) 97.3 F (36.3 C) (!) 97.5 F (36.4 C) 97.8 F (36.6 C)  TempSrc:  Temporal Oral Oral  SpO2: 99% 100% 98%   Weight:      Height:        Wt Readings from Last 3 Encounters:  04/05/24 65.8 kg  04/02/24 65.8 kg  03/26/24 69.9 kg     Intake/Output Summary (Last 24 hours) at 04/16/2024 1229 Last data filed at 04/16/2024 0500 Gross per 24 hour  Intake --  Output 1200 ml  Net -1200 ml     Physical Exam  Awake this morning, impaired judgment and insight, appears comfortable, with significant hiccups  Mild tachypneic  Abdomen nontender  Right BKA with wound VAC, left foot with no gangrene or cyanosis, connected to SCDs.   Data Review:    Recent Labs  Lab 04/10/24 0308 04/11/24 0232 04/12/24 0744 04/13/24 0547 04/14/24 0538 04/15/24 0232  WBC 11.3*  --    --   --   --  9.7  HGB 5.8* 5.3* 5.2* 5.9* 5.4* 4.7*  HCT 18.7* 16.7* 16.3* 18.8* 17.1* 15.0*  PLT 235  --   --   --   --  219  MCV 100.0  --   --   --   --  106.4*  MCH 31.0  --   --   --   --  33.3  MCHC 31.0  --   --   --   --  31.3  RDW 12.9  --   --   --   --  18.0*  LYMPHSABS 0.3*  --   --   --   --   --   MONOABS 0.8  --   --   --   --   --   EOSABS 0.1  --   --   --   --   --   BASOSABS 0.0  --   --   --   --   --     Recent Labs  Lab 04/15/24 0232  NA 145  K 4.2  CL 111  CO2 26  ANIONGAP 8  GLUCOSE 258*  BUN 61*  CREATININE 2.45*  CALCIUM 8.9      Recent Labs  Lab  04/15/24 0232  CALCIUM 8.9    --------------------------------------------------------------------------------------------------------------- Lab Results  Component Value Date   CHOL 108 04/06/2024   HDL 35 (L) 04/06/2024   LDLCALC 62 04/06/2024   TRIG 59 04/06/2024   CHOLHDL 3.1 04/06/2024    Lab Results  Component Value Date   HGBA1C 9.2 (H) 04/03/2024   No results for input(s): TSH, T4TOTAL, FREET4, T3FREE, THYROIDAB in the last 72 hours. No results for input(s): VITAMINB12, FOLATE, FERRITIN, TIBC, IRON , RETICCTPCT in the last 72 hours.  ------------------------------------------------------------------------------------------------------------------ Cardiac Enzymes No results for input(s): CKMB, TROPONINI, MYOGLOBIN in the last 168 hours.  Invalid input(s): CK  Micro Results No results found for this or any previous visit (from the past 240 hours).   Radiology Report No results found.     Signature  -   Brayton Lye M.D on 04/16/2024 at 12:29 PM   -  To page go to [email or web address]   "

## 2024-04-17 NOTE — Plan of Care (Signed)
" °  Problem: Education: Goal: Ability to describe self-care measures that may prevent or decrease complications (Diabetes Survival Skills Education) will improve Outcome: Progressing   Problem: Nutritional: Goal: Maintenance of adequate nutrition will improve Outcome: Not Progressing   "

## 2024-04-17 NOTE — Progress Notes (Signed)
 "                                                                                                                                                                                                         Daily Progress Note   Patient Name: Gene Fletcher       Date: 04/17/2024 DOB: [birth year]  Age: 74 y.o. MRN#: [id number] Attending Physician: Raenelle Donalda HERO, MD Primary Care Physician: [name], MD Admit Date: 04/03/2024  Reason for Consultation/Follow-up: Non pain symptom management, Pain control, Psychosocial/spiritual support, and Terminal Care  Objective: I have reviewed medical records including: EPIC notes: Hospitalist, nursing, TOC. MAR: Comfort medications per MAR.  Continues on scheduled Ativan  every 6 hours, Compazine  every 6 hours, and Reglan  every 6 hours.  As needed medications administered in the last 24 hours-Thorazine  x 1, oxycodone  x 1.  Subjective: Received report from primary RN -no acute concerns.  RN reports patient has been sleeping most of the day.  Improved nausea/vomiting.  Went to visit patient at bedside-nephew/Magellan present.  Patient was lying in bed asleep -did not attempt to wake to preserve comfort. No signs or non-verbal gestures of pain or discomfort noted. No respiratory distress, increased work of breathing, or secretions noted.   Emotional support provided to family.  Therapeutic listening provided as he reflects on how family are switching out to support patient at this time to ensure he's not alone.  No concerns today.  Length of Stay: 14  Current Medications: Scheduled Meds:   antiseptic oral rinse  15 mL Topical BID   bisacodyl   10 mg Rectal Once   LORazepam   0.5 mg Intravenous Q6H   metoCLOPramide  (REGLAN ) injection  5 mg Intravenous Q6H   nystatin   5 mL Mouth/Throat QID   prochlorperazine   10 mg Intravenous Q6H   tamsulosin   0.4 mg Oral Daily    Continuous Infusions:   PRN Meds: acetaminophen  **OR** acetaminophen ,  artificial tears, chlorproMAZINE  (THORAZINE ) injection, diphenhydrAMINE , feeding supplement (GLUCERNA SHAKE), glycopyrrolate  **OR** glycopyrrolate  **OR** glycopyrrolate , haloperidol  **OR** haloperidol  **OR** haloperidol  lactate, HYDROmorphone  (DILAUDID ) injection, LORazepam  **OR** LORazepam  **OR** LORazepam , metoCLOPramide  (REGLAN ) injection, ondansetron  **OR** ondansetron  (ZOFRAN ) IV, oxyCODONE , phenol  Physical Exam Vitals and nursing note reviewed.  Constitutional:      General: He is not in acute distress.    Appearance: He is ill-appearing.  Pulmonary:     Effort: Pulmonary effort is normal. No respiratory distress.  Skin:    General: Skin is warm and dry.  Neurological:     Comments: sleepy  Vital Signs: BP 112/62 (BP Location: Left Arm)   Pulse 90   Temp 97.8 F (36.6 C) (Oral)   Resp 18   Ht 5' 10 (1.778 m)   Wt 65.8 kg   SpO2 98%   BMI 20.81 kg/m  SpO2: SpO2: 98 % O2 Device: O2 Device: Room Air O2 Flow Rate: O2 Flow Rate (L/min): 2 L/min  Intake/output summary:  Intake/Output Summary (Last 24 hours) at 04/17/2024 1028 Last data filed at 04/17/2024 0530 Gross per 24 hour  Intake --  Output 2000 ml  Net -2000 ml   LBM: Last BM Date : 04/12/24 Baseline Weight: Weight: 65.8 kg Most recent weight: Weight: 65.8 kg       Palliative Assessment/Data: PPS 10%      Patient Active Problem List   Diagnosis Date Noted   Gangrene of right foot (HCC) 04/03/2024   No transfusions per religious beliefs ([employer]es) 04/03/2024   Diabetic foot infection (HCC) 04/03/2024   DKA (diabetic ketoacidosis) (HCC) 04/03/2024   Malignant neoplasm of lower lobe of right lung (HCC) 11/07/2023   Malignant 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) 10/16/2023   Lung nodules 10/04/2023   Adenopathy 10/04/2023   Insulin  long-term use (HCC) 07/04/2022   Leg edema 07/04/2022   Chronic kidney disease, stage 4 (severe) (HCC) 07/04/2022   Noncompliance with treatment  02/12/2018   Anemia 07/06/2015   Diabetic renal disease (HCC) 12/23/2014   Encounter for general adult medical examination without abnormal findings 12/16/2014   Diabetic retinopathy associated with type 2 diabetes mellitus (HCC) 12/18/2013   Type 2 diabetes mellitus without complications (HCC) 09/24/2009   Hyperlipidemia 09/24/2009   Hyperglycemia due to type 2 diabetes mellitus (HCC) 09/24/2009   Essential hypertension 09/24/2009   Encounter for other general examination 09/24/2009    Palliative Care Assessment & Plan   Patient Profile: 74 y.o. male  with past medical history of hypertension, hyperlipidemia, diabetes, CKD 4, lung cancer status post radiation, anemia was admitted on 04/03/2024 with diabetic foot infection/gangrene/?  Osteomyelitis, DKA, anemia refusing blood products as a Jehov[employer]'s Witness.  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  Patient's hospital course was complicated by severe anemia.  All nontransfusion measures were maximized and patient continues to decline; as a Jehov[employer]'s Witness he declines blood products.  Prior and throughout hospitalization patient has also struggled with significant nausea and vomiting, which is likely related to uremia.   Noted TOC has been in contact with patient's APS worker -they indicate family is still patient's surrogate decision makers.  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)   DKA (diabetic ketoacidosis) (HCC)   Terminal Care  Recommendations/Plan: Continue full comfort measures Continue DNR/DNI as previously documented Anticipate hospital death -family not  interested in hospice transfer Continue palliative wound care Continue current comfort focused medication regimen as noted below - no changes PMT will continue to follow and support holistically  Symptom Management Dilaudid  IV PRN pain/dyspnea/increased work of breathing/RR>25 Tylenol  PRN pain/fever Biotin twice daily Benadryl  PRN itching Robinul  PRN secretions Haldol  PRN agitation/delirium Continue ativan  every 6 hours; PRN doses for breakthrough anxiety/seizure/sleep/distress Zofran  PRN breakthrough nausea/vomiting  Continue Compazine  every 6 hours Continue scheduled reglan  q6h in between scheduled Compazine ; continue PRN doses for breakthrough nausea/vomiting Liquifilm Tears PRN dry eye   Goals of Care and Additional Recommendations: Limitations on Scope of Treatment: Full Comfort Care  Code Status:    Code Status Orders  (From admission, onward)           Start     Ordered   04/15/24 2111  Do not attempt resuscitation (DNR) - Comfort care  (Code Status)  Continuous       Question Answer Comment  If patient has no pulse and is not breathing Do Not Attempt Resuscitation   In Pre-Arrest Conditions (Patient Is Breathing and Has a Pulse) Provide comfort measures. Relieve any mechanical airway obstruction. Avoid transfer unless required for comfort.   Consent: Discussion documented in EHR or advanced directives reviewed      04/15/24 2110           Code Status History     Date Active Date Inactive Code Status Order ID Comments User Context   04/15/2024 1656 04/15/2024 2110 Limited: Do not attempt resuscitation (DNR) -DNR-LIMITED -Do Not Intubate/DNI  [id number]  Claudene Jeoffrey HERO, NP Inpatient   04/15/2024 0759 04/15/2024 1656 Limited: Do not attempt resuscitation (DNR) -DNR-LIMITED -Do Not Intubate/DNI  [id number]  Sherlon Brayton RAMAN, MD Inpatient   04/03/2024 1448 04/15/2024 0759 Full Code [id number]  Seena Marsa NOVAK, MD ED       Prognosis:  Days - <1  week  Discharge Planning: Anticipated Hospital Death  Care plan was discussed with primary RN, patient's nephew, Dr. Raenelle  Thank you for allowing the Palliative Medicine Team to assist in the care of this patient.  Billing based on MDM: High  Problems Addressed: One acute or chronic illness or injury that poses a threat to life or bodily function  Amount and/or Complexity of Data: Category 1:Review of prior external note(s) from each unique source, Review of the result(s) of each unique test, and Assessment requiring an independent historian(s) and Category 3:Discussion of management or test interpretation with external physician/other qualified health care professional/appropriate source (not separately reported)  Risks: Parenteral controlled substances      Pilar Corrales HERO Claudene, NP  Please contact Palliative Medicine Team phone at [phone] for questions and concerns.       "

## 2024-04-18 NOTE — Progress Notes (Addendum)
 "                                                                                                                                                                                                         Daily Progress Note   Patient Name: Gene Fletcher       Date: 04/18/2024 DOB: 1951-03-14  Age: 74 y.o. MRN#: [id number] Attending Physician: Raenelle Donalda HERO, MD Primary Care Physician: [name], MD Admit Date: 04/03/2024  Reason for Consultation/Follow-up: Non pain symptom management, Pain control, Psychosocial/spiritual support, and Terminal Care  Objective: I have reviewed medical records including: EPIC notes: Nursing, hospitalist. MAR: Comfort medications per [facility].  As needed medications administered in the last 24 hours-oxycodone  x 1  Subjective: Received report from primary RN -no acute concerns.   Went to visit patient at bedside-brother/Gene Fletcher present in person and sister/Gene Fletcher present via speaker phone.  Patient was lying in bed intermittently awake/asleep. No signs or non-verbal gestures of pain or discomfort noted. No respiratory distress, increased work of breathing, or secretions noted.  He denies pain or nausea.  Per family patient was taking bites and sips yesterday; however, he is not interested in food or drink today. Education provided on the natural trajectory at EOL.  Emotional support provided to family.  We discussed the possibility of a hospice transfer.  Family continue to express wanting patient to remain in house for end-of-life care -they are comfortable here I do not want to move him.  Encouraged consideration of inpatient hospice facility as they also can provide a comfortable environment.  Discussed hospice facility options and if they choose to pursue transfer, would be most interested in [facility].  Encouraged family to take a tour of [facility] to see if that facility would be one they were interested in.  Family also expressed that it was  difficult for them to visit  all the way over here in the [facility] tower.  We discussed transferring patient to [facility] for easier access to patient's room -family are agreeable and appreciative.  Family expressed appreciation for PMT assistance.  All questions and concerns addressed. Encouraged to call with questions and/or concerns. PMT card previously provided.  Length of Stay: 15  Current Medications: Scheduled Meds:   antiseptic oral rinse  15 mL Topical BID   bisacodyl   10 mg Rectal Once   LORazepam   0.5 mg Intravenous Q6H   metoCLOPramide  (REGLAN ) injection  5 mg Intravenous Q6H   nystatin   5 mL Mouth/Throat QID   prochlorperazine   10 mg Intravenous Q6H  tamsulosin   0.4 mg Oral Daily    Continuous Infusions:   PRN Meds: acetaminophen  **OR** acetaminophen , artificial tears, chlorproMAZINE  (THORAZINE ) injection, diphenhydrAMINE , feeding supplement (GLUCERNA SHAKE), glycopyrrolate  **OR** glycopyrrolate  **OR** glycopyrrolate , haloperidol  **OR** haloperidol  **OR** haloperidol  lactate, HYDROmorphone  (DILAUDID ) injection, LORazepam  **OR** LORazepam  **OR** LORazepam , metoCLOPramide  (REGLAN ) injection, ondansetron  **OR** ondansetron  (ZOFRAN ) IV, oxyCODONE , phenol  Physical Exam Vitals and nursing note reviewed.  Constitutional:      General: He is not in acute distress.    Appearance: He is ill-appearing.  Pulmonary:     Effort: Pulmonary effort is normal. No respiratory distress.  Skin:    General: Skin is warm and dry.  Neurological:     Comments: sleepy             Vital Signs: BP 113/64 (BP Location: Left Arm)   Pulse 96   Temp 98 F (36.7 C) (Oral)   Resp 18   Ht 5' 10 (1.778 m)   Wt 65.8 kg   SpO2 98%   BMI 20.81 kg/m  SpO2: SpO2: 98 % O2 Device: O2 Device: Room Air O2 Flow Rate: O2 Flow Rate (L/min): 2 L/min  Intake/output summary:  Intake/Output Summary (Last 24 hours) at 04/18/2024 9176 Last data filed at 04/17/2024 2113 Gross per 24 hour  Intake --   Output 850 ml  Net -850 ml   LBM: Last BM Date : 04/12/24 Baseline Weight: Weight: 65.8 kg Most recent weight: Weight: 65.8 kg       Palliative Assessment/Data: PPS 10-20%      Patient Active Problem List   Diagnosis Date Noted   Gangrene of right foot (HCC) 04/03/2024   No transfusions per religious beliefs (Jehovahs Witnesses) 04/03/2024   Diabetic foot infection (HCC) 04/03/2024   DKA (diabetic ketoacidosis) (HCC) 04/03/2024   Malignant neoplasm of lower lobe of right lung (HCC) 11/07/2023   Malignant 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) 10/16/2023   Lung nodules 10/04/2023   Adenopathy 10/04/2023   Insulin  long-term use (HCC) 07/04/2022   Leg edema 07/04/2022   Chronic kidney disease, stage 4 (severe) (HCC) 07/04/2022   Noncompliance with treatment 02/12/2018   Anemia 07/06/2015   Diabetic renal disease (HCC) 12/23/2014   Encounter for general adult medical examination without abnormal findings 12/16/2014   Diabetic retinopathy associated with type 2 diabetes mellitus (HCC) 12/18/2013   Type 2 diabetes mellitus without complications (HCC) 09/24/2009   Hyperlipidemia 09/24/2009   Hyperglycemia due to type 2 diabetes mellitus (HCC) 09/24/2009   Essential hypertension 09/24/2009   Encounter for other general examination 09/24/2009    Palliative Care Assessment & Plan   Patient Profile: 74 y.o. male  with past medical history of hypertension, hyperlipidemia, diabetes, CKD 4, lung cancer status post radiation, anemia was admitted on 04/03/2024 with diabetic foot infection/gangrene/?  Osteomyelitis, DKA, anemia refusing blood products as a Jehovah's Witness.  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  Patient's hospital course was complicated by severe anemia.  All nontransfusion measures were maximized and patient continues to decline; as a Jehovah's Witness he declines blood products.  Prior and throughout hospitalization patient has also  struggled with significant nausea and vomiting, which is likely related to uremia.   Noted TOC has been in contact with patient's APS worker -they indicate family is still patient's surrogate decision makers.  Assessment: Principal Problem:   Diabetic foot infection (HCC) Active Problems:   Type 2 diabetes mellitus without complications (HCC)   Hyperlipidemia   Essential hypertension  Chronic kidney disease, stage 4 (severe) (HCC)   Anemia   Malignant 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)   Gangrene of right foot (HCC)   No transfusions per religious beliefs [employer])   DKA (diabetic ketoacidosis) (HCC)   Terminal care  Recommendations/Plan: Continue full comfort measures Continue DNR/DNI as previously documented Family prefer hospital death, but are considering hospice facility, [facility] Continue palliative wound care Continue current comfort focused medication regimen as noted below - no changes PMT will continue to follow and support holistically   Symptom Management Dilaudid  IV PRN pain/dyspnea/increased work of breathing/RR>25 Tylenol  PRN pain/fever Biotin twice daily Benadryl  PRN itching Robinul  PRN secretions Haldol  PRN agitation/delirium Continue ativan  every 6 hours; PRN doses for breakthrough anxiety/seizure/sleep/distress Zofran  PRN breakthrough nausea/vomiting Continue Compazine  every 6 hours Continue scheduled reglan  q6h in between scheduled Compazine ; continue PRN doses for breakthrough nausea/vomiting Liquifilm Tears PRN dry eye  Goals of Care and Additional Recommendations: Limitations on Scope of Treatment: Full Comfort Care  Code Status:    Code Status Orders  (From admission, onward)           Start     Ordered   04/15/24 2111  Do not attempt resuscitation (DNR) - Comfort care  (Code Status)  Continuous       Question Answer Comment  If patient has no pulse and is not breathing Do Not Attempt Resuscitation   In  Pre-Arrest Conditions (Patient Is Breathing and Has a Pulse) Provide comfort measures. Relieve any mechanical airway obstruction. Avoid transfer unless required for comfort.   Consent: Discussion documented in EHR or advanced directives reviewed      04/15/24 2110           Code Status History     Date Active Date Inactive Code Status Order ID Comments User Context   04/15/2024 1656 04/15/2024 2110 Limited: Do not attempt resuscitation (DNR) -DNR-LIMITED -Do Not Intubate/DNI  [id number]  Claudene Jeoffrey HERO, NP Inpatient   04/15/2024 0759 04/15/2024 1656 Limited: Do not attempt resuscitation (DNR) -DNR-LIMITED -Do Not Intubate/DNI  [id number]  Sherlon Brayton RAMAN, MD Inpatient   04/03/2024 1448 04/15/2024 0759 Full Code [id number]  Seena Marsa NOVAK, MD ED       Prognosis:  < 2 weeks  Discharge Planning: To Be Determined  Care plan was discussed with primary RN, patient's family, Dr. Raenelle, [facility]  Thank you for allowing the Palliative Medicine Team to assist in the care of this patient.  Billing based on MDM: High  Problems Addressed: One acute or chronic illness or injury that poses a threat to life or bodily function  Risks: Parenteral controlled substances      Sharlyne Koeneman HERO Claudene, NP  Please contact Palliative Medicine Team phone at [phone] for questions and concerns.       "

## 2024-04-18 NOTE — Progress Notes (Addendum)
 "                        PROGRESS NOTE        PATIENT DETAILS Name: Gene Fletcher Age: 74 y.o. Sex: male Date of Birth: [birth year] Admit Date: 04/03/2024 Admitting Physician [name], MD [name], [name], MD  Brief Summary: Patient is a 74 y.o.  male with history of HTN, HLD, DM-2, CKD 4, lung cancer-s/p radiation, chronic anemia-presented with left foot wound infection with wet gangrene-evaluated by orthopedics-underwent left BKA-further hospital course complicated by worsening anemia-failure to thrive syndrome-uremia-patient is a Gene Fletcher refused blood products.  Unfortunately-even with an Aranesp /B12/folic acid /iron -he continued to decline-after discussion with family-he was transition to full comfort measures.  Significant events: 1/14>> admit to [facility]. 1/16>> right BKA 1/26>> full comfort measures.  Significant studies: 1/14>> CXR: Right midlung nodular density. 1/14>> x-ray right foot: Mid and forefoot necrotizing fasciitis-no evidence of osteomyelitis. 1/16>> x-ray abdomen: No bowel obstruction.  Significant microbiology data: None  Procedures: 1/16>> right BKA  Consults: Orthopedics Palliative care  Subjective: Has been barely eating for the past several days-lethargic but awake-already removing his wound VAC as I was rounding this morning.  Objective: Vitals: Blood pressure 113/64, pulse 96, temperature 98 F (36.7 C), temperature source Oral, resp. rate 18, height 5' 10 (1.778 m), weight 65.8 kg, SpO2 98%.   Exam: Frail but awake/alert Not in any distress.  Pertinent Labs/Radiology:    Latest Ref Rng & Units 04/15/2024    2:32 AM 04/14/2024    5:38 AM 04/13/2024    5:47 AM  CBC  WBC 4.0 - 10.5 K/uL 9.7     Hemoglobin 13.0 - 17.0 g/dL 4.7  5.4  5.9   Hematocrit 39.0 - 52.0 % 15.0  17.1  18.8   Platelets 150 - 400 K/uL 219       Lab Results  Component Value Date   NA 145 04/15/2024   K 4.2 04/15/2024   CL 111 04/15/2024    CO2 26 04/15/2024      Assessment/Plan: Diabetic foot infection with wet gangrene of right foot S/p right BKA by Dr. Harden antibiotics were stopped 1/19 Wound VAC to be removed today per orthopedics.  Multifactorial anemia Secondary to anemia of CKD, acute illness, blood loss from recent surgery Jehovah's Witness-refused PRBC transfusion Unfortunately-Hb continued to worsen in spite of Aranesp /IV iron /vitamin B12/folic acid  and minimization of blood draws-patient/family was aware of life-threatening and life disabling consequences-he became more symptomatic as his anemia worsened. After extensive discussion by prior physician/palliative care team-transition to full comfort measures on 1/26.  Nausea/vomiting-poor appetite Unfortunately-ongoing even prior to this hospitalization but worsened during this admission-abdominal x-rays were negative for SBO-but did show some stool burden-placed on MiraLAX /laxatives-without any significant improvement-per prior notes-this is thought to be now secondary to uremia.  DKA History of DM-2 Previously on insulin  infusion-subsequently transition to SQ insulin -currently comfort measures in effect.  Acute metabolic encephalopathy Secondary to diabetic foot/DKA/uremia-suspicion for some amount of cognitive issues as well. Supportive care.  HTN No longer on any antihypertensives-in fact blood pressure was low-requiring addition of midodrine .  History of PAD Supportive care  History of lung cancer with nonspecific right-sided x-ray changes. Supportive care  Goals of care Extensive discussion done by prior physician/palliative care team-due to continued decline-poor oral intake-persistent nausea/vomiting-made DNR-and subsequently transition to full comfort measures.  Although inpatient death anticipated-May need to consider residential hospice.  Palliative care spoke with brother on 1/29-they prefer  inpatient death-palliative care to continue goals of  care/disposition conversations over the next several days.  Pressure Ulcer: Agree with assessment as outlined below. Wound 04/14/24 0400 Pressure Injury Sacrum Mid;Left Deep Tissue Pressure Injury - Purple or maroon localized area of discolored intact skin or blood-filled blister due to damage of underlying soft tissue from pressure and/or shear. (Active)     Code status:   Code Status: Do not attempt resuscitation (DNR) - Comfort care   DVT Prophylaxis:Not needed-comfort care   Family Communication: Brother at bedside   Disposition Plan: Status is: Inpatient Remains inpatient appropriate because: Severity of illness   Planned Discharge Destination:residential hospice vs inpatient death   Diet: Diet Order             Diet regular Room service appropriate? Yes; Fluid consistency: Thin  Diet effective now                     Antimicrobial agents: Anti-infectives (From admission, onward)    Start     Dose/Rate Route Frequency Ordered Stop   04/05/24 1500  vancomycin  (VANCOCIN ) IVPB 1000 mg/200 mL premix  Status:  Discontinued        1,000 mg 200 mL/hr over 60 Minutes Intravenous Every 48 hours 04/03/24 1458 04/04/24 1059   04/05/24 0100  vancomycin  (VANCOCIN ) IVPB 1000 mg/200 mL premix  Status:  Discontinued        1,000 mg 200 mL/hr over 60 Minutes Intravenous Every 36 hours 04/04/24 1059 04/04/24 1349   04/04/24 1400  linezolid  (ZYVOX ) tablet 600 mg        600 mg Oral Every 12 hours 04/04/24 1349 04/08/24 1821   04/03/24 2000  metroNIDAZOLE  (FLAGYL ) IVPB 500 mg        500 mg 100 mL/hr over 60 Minutes Intravenous Every 12 hours 04/03/24 1448 04/08/24 2108   04/03/24 1800  ceFEPIme  (MAXIPIME ) 2 g in sodium chloride  0.9 % 100 mL IVPB        2 g 200 mL/hr over 30 Minutes Intravenous Every 24 hours 04/03/24 1454 04/08/24 1859   04/03/24 1145  piperacillin -tazobactam (ZOSYN ) IVPB 3.375 g        3.375 g 100 mL/hr over 30 Minutes Intravenous  Once 04/03/24 1132  04/03/24 1226   04/03/24 1130  vancomycin  (VANCOCIN ) IVPB 1000 mg/200 mL premix  Status:  Discontinued        1,000 mg 200 mL/hr over 60 Minutes Intravenous  Once 04/03/24 1121 04/03/24 1122   04/03/24 1130  vancomycin  (VANCOREADY) IVPB 1500 mg/300 mL        1,500 mg 150 mL/hr over 120 Minutes Intravenous  Once 04/03/24 1122 04/03/24 1456        MEDICATIONS: Scheduled Meds:  antiseptic oral rinse  15 mL Topical BID   bisacodyl   10 mg Rectal Once   LORazepam   0.5 mg Intravenous Q6H   metoCLOPramide  (REGLAN ) injection  5 mg Intravenous Q6H   nystatin   5 mL Mouth/Throat QID   prochlorperazine   10 mg Intravenous Q6H   tamsulosin   0.4 mg Oral Daily   Continuous Infusions: PRN Meds:.acetaminophen  **OR** acetaminophen , artificial tears, chlorproMAZINE  (THORAZINE ) injection, diphenhydrAMINE , feeding supplement (GLUCERNA SHAKE), glycopyrrolate  **OR** glycopyrrolate  **OR** glycopyrrolate , haloperidol  **OR** haloperidol  **OR** haloperidol  lactate, HYDROmorphone  (DILAUDID ) injection, LORazepam  **OR** LORazepam  **OR** LORazepam , metoCLOPramide  (REGLAN ) injection, ondansetron  **OR** ondansetron  (ZOFRAN ) IV, oxyCODONE , phenol   I have personally reviewed following labs and imaging studies  LABORATORY DATA: CBC: Recent Labs  Lab 04/12/24 0744 04/13/24 0547 04/14/24 0538 04/15/24  0232  WBC  --   --   --  9.7  HGB 5.2* 5.9* 5.4* 4.7*  HCT 16.3* 18.8* 17.1* 15.0*  MCV  --   --   --  106.4*  PLT  --   --   --  219    Basic Metabolic Panel: Recent Labs  Lab 04/15/24 0232  NA 145  K 4.2  CL 111  CO2 26  GLUCOSE 258*  BUN 61*  CREATININE 2.45*  CALCIUM 8.9    GFR: Estimated Creatinine Clearance: 24.6 mL/min (A) (by C-G formula based on SCr of 2.45 mg/dL (H)).  Liver Function Tests: No results for input(s): AST, ALT, ALKPHOS, BILITOT, PROT, ALBUMIN in the last 168 hours. No results for input(s): LIPASE, AMYLASE in the last 168 hours. No results for input(s):  AMMONIA in the last 168 hours.  Coagulation Profile: No results for input(s): INR, PROTIME in the last 168 hours.  Cardiac Enzymes: No results for input(s): CKTOTAL, CKMB, CKMBINDEX, TROPONINI in the last 168 hours.  BNP (last 3 results) No results for input(s): PROBNP in the last 8760 hours.  Lipid Profile: No results for input(s): CHOL, HDL, LDLCALC, TRIG, CHOLHDL, LDLDIRECT in the last 72 hours.  Thyroid  Function Tests: No results for input(s): TSH, T4TOTAL, FREET4, T3FREE, THYROIDAB in the last 72 hours.  Anemia Panel: No results for input(s): VITAMINB12, FOLATE, FERRITIN, TIBC, IRON , RETICCTPCT in the last 72 hours.  Urine analysis:    Component Value Date/Time   COLORURINE YELLOW 04/03/2024 1534   APPEARANCEUR HAZY (A) 04/03/2024 1534   LABSPEC 1.018 04/03/2024 1534   PHURINE 5.0 04/03/2024 1534   GLUCOSEU >=500 (A) 04/03/2024 1534   HGBUR SMALL (A) 04/03/2024 1534   BILIRUBINUR NEGATIVE 04/03/2024 1534   KETONESUR 5 (A) 04/03/2024 1534   PROTEINUR 100 (A) 04/03/2024 1534   NITRITE NEGATIVE 04/03/2024 1534   LEUKOCYTESUR NEGATIVE 04/03/2024 1534    Sepsis Labs: Lactic Acid, Venous    Component Value Date/Time   LATICACIDVEN 1.9 04/03/2024 1205    MICROBIOLOGY: No results found for this or any previous visit (from the past 240 hours).  RADIOLOGY STUDIES/RESULTS: No results found.   LOS: 15 days   Donalda Applebaum, MD  [facility]    To contact the attending provider between 7A-7P or the covering provider during after hours 7P-7A, please log into the web site [email or web address] and access using universal [place] password for that web site. If you do not have the password, please call the hospital operator.  04/18/2024, 11:44 AM    "

## 2024-04-18 NOTE — Plan of Care (Signed)
" °  Problem: Clinical Measurements: Goal: Will remain free from infection Outcome: Progressing   Problem: Nutritional: Goal: Maintenance of adequate nutrition will improve Outcome: Not Progressing   Problem: Nutritional: Goal: Maintenance of adequate nutrition will improve Outcome: Not Progressing   "

## 2024-04-18 NOTE — TOC Progression Note (Signed)
 Transition of Care [facility]) - Progression Note    Patient Details  Name: Gene Fletcher MRN: [id number] Date of Birth: [birth year]  [facility] CM/SW Contact  Inocente GORMAN Kindle, LCSW Phone Number: 04/18/2024, 8:27 AM  Clinical Narrative:    ICM continues to be available if needs arise.    Expected Discharge Plan:  (comfort care) Barriers to Discharge: Continued Medical Work up (comfort care in hospital)               Expected Discharge Plan and Services In-house Referral: Clinical Social Work   Post Acute Care Choice: Skilled Nursing Facility Living arrangements for the past 2 months: Single Family Home                                       Social Drivers of Health (SDOH) Interventions SDOH Screenings   Food Insecurity: No Food Insecurity (04/03/2024)  Housing: Low Risk (04/03/2024)  Transportation Needs: No Transportation Needs (04/03/2024)  Utilities: Not At Risk (04/03/2024)  Depression (PHQ2-9): Low Risk (11/07/2023)  Social Connections: Socially Integrated (04/03/2024)  Tobacco Use: Low Risk (04/05/2024)    Readmission Risk Interventions     No data to display

## 2024-04-19 DIAGNOSIS — L089 Local infection of the skin and subcutaneous tissue, unspecified: Secondary | ICD-10-CM | POA: Diagnosis not present

## 2024-04-19 DIAGNOSIS — I10 Essential (primary) hypertension: Secondary | ICD-10-CM | POA: Diagnosis not present

## 2024-04-19 DIAGNOSIS — I96 Gangrene, not elsewhere classified: Secondary | ICD-10-CM | POA: Diagnosis not present

## 2024-04-19 DIAGNOSIS — C3411 Malignant neoplasm of upper lobe, right bronchus or lung: Secondary | ICD-10-CM | POA: Diagnosis not present

## 2024-04-19 DIAGNOSIS — E782 Mixed hyperlipidemia: Secondary | ICD-10-CM | POA: Diagnosis not present

## 2024-04-19 DIAGNOSIS — E11628 Type 2 diabetes mellitus with other skin complications: Secondary | ICD-10-CM | POA: Diagnosis not present

## 2024-04-19 DIAGNOSIS — N184 Chronic kidney disease, stage 4 (severe): Secondary | ICD-10-CM | POA: Diagnosis not present

## 2024-04-19 NOTE — Plan of Care (Signed)
 " Problem: Education: Goal: Ability to describe self-care measures that may prevent or decrease complications (Diabetes Survival Skills Education) will improve Outcome: Progressing Goal: Individualized Educational Video(s) Outcome: Progressing   Problem: Coping: Goal: Ability to adjust to condition or change in health will improve Outcome: Progressing   Problem: Fluid Volume: Goal: Ability to maintain a balanced intake and output will improve Outcome: Progressing   Problem: Health Behavior/Discharge Planning: Goal: Ability to identify and utilize available resources and services will improve Outcome: Progressing Goal: Ability to manage health-related needs will improve Outcome: Progressing   Problem: Metabolic: Goal: Ability to maintain appropriate glucose levels will improve Outcome: Progressing   Problem: Nutritional: Goal: Maintenance of adequate nutrition will improve Outcome: Progressing Goal: Progress toward achieving an optimal weight will improve Outcome: Progressing   Problem: Skin Integrity: Goal: Risk for impaired skin integrity will decrease Outcome: Progressing   Problem: Tissue Perfusion: Goal: Adequacy of tissue perfusion will improve Outcome: Progressing   Problem: Education: Goal: Ability to describe self-care measures that may prevent or decrease complications (Diabetes Survival Skills Education) will improve Outcome: Progressing Goal: Individualized Educational Video(s) Outcome: Progressing   Problem: Cardiac: Goal: Ability to maintain an adequate cardiac output will improve Outcome: Progressing   Problem: Health Behavior/Discharge Planning: Goal: Ability to identify and utilize available resources and services will improve Outcome: Progressing Goal: Ability to manage health-related needs will improve Outcome: Progressing   Problem: Fluid Volume: Goal: Ability to achieve a balanced intake and output will improve Outcome: Progressing    Problem: Metabolic: Goal: Ability to maintain appropriate glucose levels will improve Outcome: Progressing   Problem: Nutritional: Goal: Maintenance of adequate nutrition will improve Outcome: Progressing Goal: Maintenance of adequate weight for body size and type will improve Outcome: Progressing   Problem: Respiratory: Goal: Will regain and/or maintain adequate ventilation Outcome: Progressing   Problem: Urinary Elimination: Goal: Ability to achieve and maintain adequate renal perfusion and functioning will improve Outcome: Progressing   Problem: Education: Goal: Knowledge of General Education information will improve Description: Including pain rating scale, medication(s)/side effects and non-pharmacologic comfort measures Outcome: Progressing   Problem: Health Behavior/Discharge Planning: Goal: Ability to manage health-related needs will improve Outcome: Progressing   Problem: Clinical Measurements: Goal: Ability to maintain clinical measurements within normal limits will improve Outcome: Progressing Goal: Will remain free from infection Outcome: Progressing Goal: Diagnostic test results will improve Outcome: Progressing Goal: Respiratory complications will improve Outcome: Progressing Goal: Cardiovascular complication will be avoided Outcome: Progressing   Problem: Activity: Goal: Risk for activity intolerance will decrease Outcome: Progressing   Problem: Nutrition: Goal: Adequate nutrition will be maintained Outcome: Progressing   Problem: Coping: Goal: Level of anxiety will decrease Outcome: Progressing   Problem: Elimination: Goal: Will not experience complications related to bowel motility Outcome: Progressing Goal: Will not experience complications related to urinary retention Outcome: Progressing   Problem: Pain Managment: Goal: General experience of comfort will improve and/or be controlled Outcome: Progressing   Problem: Safety: Goal:  Ability to remain free from injury will improve Outcome: Progressing   Problem: Skin Integrity: Goal: Risk for impaired skin integrity will decrease Outcome: Progressing   Problem: Education: Goal: Knowledge of the prescribed therapeutic regimen will improve Outcome: Progressing   Problem: Bowel/Gastric: Goal: Gastrointestinal status for postoperative course will improve Outcome: Progressing   Problem: Cardiac: Goal: Ability to maintain an adequate cardiac output Outcome: Progressing Goal: Will show no evidence of cardiac arrhythmias Outcome: Progressing   Problem: Nutritional: Goal: Will attain and maintain optimal nutritional status Outcome:  Progressing   Problem: Neurological: Goal: Will regain or maintain usual level of consciousness Outcome: Progressing   Problem: Clinical Measurements: Goal: Ability to maintain clinical measurements within normal limits Outcome: Progressing Goal: Postoperative complications will be avoided or minimized Outcome: Progressing   "

## 2024-04-19 NOTE — Progress Notes (Addendum)
 "                                                                                                                                                                                                         Daily Progress Note   Patient Name: Gene Fletcher       Date: 04/19/2024 DOB: 1950-10-19  Age: 74 y.o. MRN#: [id number] Attending Physician: Davia Nydia POUR, MD Primary Care Physician: [name], MD Admit Date: 04/03/2024  Reason for Consultation/Follow-up: Non pain symptom management, Pain control, Psychosocial/spiritual support, and Terminal Care  Objective: I have reviewed medical records including: EPIC notes: Nursing, hospitalist. MAR: Comfort medications per [facility].  As needed medications administered in the last 24 hours- haldol  2 mg x1. Continues on scheduled ativan , reglan ,  and compazine   Subjective: Called to bedside by family.   Initially saw patient - he has no complaints, no pain, no shortness of breath. He is slightly drowsy.  Met outside the room with patient's daughter and brother - daughter is visiting from [place] and brother is from [place].   They are wondering how to move forward. We review patient's current status. Still waking up and interactive though family does report lessening interaction and more confused. We also review that he is still maintaining some PO intake though it is small - had a bowl of grits this morning.   They are very interested in knowing what his labs are - we review this isnt something we typically check once we are focusing on patient's comfort as it doesn't change their care. They understand this but feel knowing what his labs are would help them make decisions about traveling. We review that while this can give us  some information it may not necessarily help give clarity for their traveling decisions. They understand but would like labs checked today just for the information. Agreed to order CBC and BMP and then meet again later  today.  Briefly discussed hospice facility - they want lab work before making decision.   Discussed with Dr. Davia.   Checked back with family throughout the day - unfortunately labs have not been drawn yet. Discussed a provider could follow up with them tomorrow and review.   Patient joined in room by multiple family members participating in life review.   Emotional support provided to family.   Family expressed appreciation for PMT assistance.  All questions and concerns addressed. Encouraged to call with questions and/or concerns. PMT card previously provided.  Length of Stay: 16  Current Medications: Scheduled Meds:   antiseptic oral rinse  15 mL Topical BID   bisacodyl   10 mg Rectal Once   LORazepam   0.5 mg Intravenous Q6H   metoCLOPramide  (REGLAN ) injection  5 mg Intravenous Q6H   nystatin   5 mL Mouth/Throat QID   prochlorperazine   10 mg Intravenous Q6H   tamsulosin   0.4 mg Oral Daily    Continuous Infusions:   PRN Meds: acetaminophen  **OR** acetaminophen , artificial tears, chlorproMAZINE  (THORAZINE ) injection, diphenhydrAMINE , feeding supplement (GLUCERNA SHAKE), glycopyrrolate  **OR** glycopyrrolate  **OR** glycopyrrolate , haloperidol  **OR** haloperidol  **OR** haloperidol  lactate, HYDROmorphone  (DILAUDID ) injection, LORazepam  **OR** LORazepam  **OR** LORazepam , metoCLOPramide  (REGLAN ) injection, ondansetron  **OR** ondansetron  (ZOFRAN ) IV, oxyCODONE , phenol  Physical Exam Vitals and nursing note reviewed.  Constitutional:      General: He is not in acute distress.    Appearance: He is ill-appearing.  Pulmonary:     Effort: Pulmonary effort is normal. No respiratory distress.  Skin:    General: Skin is warm and dry.  Neurological:     Comments: sleepy             Vital Signs: BP 131/74   Pulse 88   Temp 98.3 F (36.8 C) (Axillary)   Resp 17   Ht 5' 10 (1.778 m)   Wt 65.8 kg   SpO2 100%   BMI 20.81 kg/m  SpO2: SpO2: 100 % O2 Device: O2 Device: Room Air O2  Flow Rate: O2 Flow Rate (L/min): 2 L/min  Intake/output summary:  Intake/Output Summary (Last 24 hours) at 04/19/2024 1304 Last data filed at 04/19/2024 9196 Gross per 24 hour  Intake 60 ml  Output 850 ml  Net -790 ml   LBM: Last BM Date : 04/15/24 Baseline Weight: Weight: 65.8 kg Most recent weight: Weight: 65.8 kg       Palliative Assessment/Data: PPS 10-20%      Patient Active Problem List   Diagnosis Date Noted   Gangrene of right foot (HCC) 04/03/2024   No transfusions per religious beliefs (Jehovahs Witnesses) 04/03/2024   Diabetic foot infection (HCC) 04/03/2024   DKA (diabetic ketoacidosis) (HCC) 04/03/2024   Malignant neoplasm of lower lobe of right lung (HCC) 11/07/2023   Malignant 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) 10/16/2023   Lung nodules 10/04/2023   Adenopathy 10/04/2023   Insulin  long-term use (HCC) 07/04/2022   Leg edema 07/04/2022   Chronic kidney disease, stage 4 (severe) (HCC) 07/04/2022   Noncompliance with treatment 02/12/2018   Anemia 07/06/2015   Diabetic renal disease (HCC) 12/23/2014   Encounter for general adult medical examination without abnormal findings 12/16/2014   Diabetic retinopathy associated with type 2 diabetes mellitus (HCC) 12/18/2013   Type 2 diabetes mellitus without complications (HCC) 09/24/2009   Hyperlipidemia 09/24/2009   Hyperglycemia due to type 2 diabetes mellitus (HCC) 09/24/2009   Essential hypertension 09/24/2009   Encounter for other general examination 09/24/2009    Palliative Care Assessment & Plan   Patient Profile: 74 y.o. male  with past medical history of hypertension, hyperlipidemia, diabetes, CKD 4, lung cancer status post radiation, anemia was admitted on 04/03/2024 with diabetic foot infection/gangrene/?  Osteomyelitis, DKA, anemia refusing blood products as a Jehovah's Witness.  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  Patient's hospital course was complicated by severe  anemia.  All nontransfusion measures were maximized and patient continues to decline; as a Jehovah's Witness he declines blood products.  Prior and throughout hospitalization patient has also struggled with significant nausea and vomiting, which is likely related to uremia.   Noted TOC has  been in contact with patient's APS worker -they indicate family is still patient's surrogate decision makers.  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)   DKA (diabetic ketoacidosis) (HCC)   Terminal care  Recommendations/Plan: Continue full comfort measures Continue DNR/DNI as previously documented Family prefer hospital death, but are considering hospice facility, [facility] Continue palliative wound care Continue current comfort focused medication regimen as noted below - no changes PMT will continue to follow and support holistically Add BMP and CBC per family's request   Symptom Management Dilaudid  IV PRN pain/dyspnea/increased work of breathing/RR>25 Tylenol  PRN pain/fever Biotin twice daily Benadryl  PRN itching Robinul  PRN secretions Haldol  PRN agitation/delirium Continue ativan  every 6 hours; PRN doses for breakthrough anxiety/seizure/sleep/distress Zofran  PRN breakthrough nausea/vomiting Continue Compazine  every 6 hours Continue scheduled reglan  q6h in between scheduled Compazine ; continue PRN doses for breakthrough nausea/vomiting Liquifilm Tears PRN dry eye  Goals of Care and Additional Recommendations: Limitations on Scope of Treatment: Full Comfort Care  Code Status:    Code Status Orders  (From admission, onward)            Start     Ordered   04/15/24 2111  Do not attempt resuscitation (DNR) - Comfort care  (Code Status)  Continuous       Question Answer Comment  If patient has no pulse and is not breathing Do Not Attempt Resuscitation   In Pre-Arrest Conditions (Patient Is Breathing and Has a Pulse) Provide comfort measures. Relieve any mechanical airway obstruction. Avoid transfer unless required for comfort.   Consent: Discussion documented in EHR or advanced directives reviewed      04/15/24 2110           Code Status History     Date Active Date Inactive Code Status Order ID Comments User Context   04/15/2024 1656 04/15/2024 2110 Limited: Do not attempt resuscitation (DNR) -DNR-LIMITED -Do Not Intubate/DNI  [id number]  Claudene Jeoffrey HERO, NP Inpatient   04/15/2024 0759 04/15/2024 1656 Limited: Do not attempt resuscitation (DNR) -DNR-LIMITED -Do Not Intubate/DNI  [id number]  Sherlon Brayton RAMAN, MD Inpatient   04/03/2024 1448 04/15/2024 0759 Full Code [id number]  Seena Marsa NOVAK, MD ED       Prognosis:  < 2 weeks  Discharge Planning: To Be Determined  Care plan was discussed with patient's family, Dr Davia  Thank you for allowing the Palliative Medicine Team to assist in the care of this patient.  I personally spent a total of 45 minutes in the care of the patient today including preparing to see the patient, performing a medically appropriate exam/evaluation, counseling and educating, referring and communicating with other health care professionals, documenting clinical information in the EHR, and coordinating care.       Tobey Jama Nat Tina, NP  Please contact Palliative Medicine Team phone at [phone] for questions and concerns.       "

## 2024-04-20 DIAGNOSIS — I96 Gangrene, not elsewhere classified: Secondary | ICD-10-CM | POA: Diagnosis not present

## 2024-04-20 DIAGNOSIS — L089 Local infection of the skin and subcutaneous tissue, unspecified: Secondary | ICD-10-CM | POA: Diagnosis not present

## 2024-04-20 DIAGNOSIS — E11628 Type 2 diabetes mellitus with other skin complications: Secondary | ICD-10-CM | POA: Diagnosis not present

## 2024-04-20 LAB — CBC
HCT: 17.6 % — ABNORMAL LOW (ref 39.0–52.0)
Hemoglobin: 5.5 g/dL — CL (ref 13.0–17.0)
MCH: 33.3 pg (ref 26.0–34.0)
MCHC: 31.3 g/dL (ref 30.0–36.0)
MCV: 106.7 fL — ABNORMAL HIGH (ref 80.0–100.0)
Platelets: 137 10*3/uL — ABNORMAL LOW (ref 150–400)
RBC: 1.65 MIL/uL — ABNORMAL LOW (ref 4.22–5.81)
RDW: 17.4 % — ABNORMAL HIGH (ref 11.5–15.5)
WBC: 2.3 10*3/uL — ABNORMAL LOW (ref 4.0–10.5)
nRBC: 0 % (ref 0.0–0.2)

## 2024-04-20 LAB — BASIC METABOLIC PANEL WITH GFR
Anion gap: 7 (ref 5–15)
BUN: 25 mg/dL — ABNORMAL HIGH (ref 8–23)
CO2: 27 mmol/L (ref 22–32)
Calcium: 8.3 mg/dL — ABNORMAL LOW (ref 8.9–10.3)
Chloride: 109 mmol/L (ref 98–111)
Creatinine, Ser: 1.66 mg/dL — ABNORMAL HIGH (ref 0.61–1.24)
GFR, Estimated: 43 mL/min — ABNORMAL LOW
Glucose, Bld: 235 mg/dL — ABNORMAL HIGH (ref 70–99)
Potassium: 3.5 mmol/L (ref 3.5–5.1)
Sodium: 143 mmol/L (ref 135–145)

## 2024-04-20 NOTE — Progress Notes (Signed)
 "          Gene Fletcher                                                                              Adewale Pucillo, is a 74 y.o. male, DOB - [birth year], FMW:[id number] Admit date - 04/03/2024    Outpatient Primary MD for the patient is [name], MD  LOS - 17  days  Chief Complaint  Patient presents with   Foot Pain       Brief summary   Patient is a 74 year old male with hypertension, HLD, DM type II, CKD 4, lung cancer-s/p radiation, chronic anemia presented with left foot wound infection with wet gangrene.  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  Further hospital course was complicated by worsening anemia, failure to thrive syndrome,  uremia. 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 Unfortunately, even with an Aranesp /B12/folic acid /iron , he continued to decline.  Palliative medicine was consulted, after GOC discussion with family on 1/26, he was transitioned to full comfort measures.    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 Continue comfort care goals   Multifactorial anemia -Secondary to anemia of CKD, acute illness, blood loss from recent surgery -Jehovah's Witness-refused PRBC transfusion -Unfortunately, hemoglobin continued to worsen in spite of Aranesp /IV iron /vitamin B12/folic acid  and minimization of blood draws -patient/family was aware of life-threatening and life disabling consequences.  Patient became more symptomatic as his anemia worsened. -Palliative medicine was consulted, after goals of care extensive discussion, patient was transitioned to full comfort measures on 1/26.   - Labs reviewed with the son, hemoglobin 5.5, creatinine better 1.6   Nausea/vomiting-poor appetite - ongoing even prior to this hospitalization but worsened during this admission --abdominal x-rays were  negative for SBO but did show some stool burden, placed on MiraLAX /laxatives-without any significant improvement --this is thought to be now secondary to uremia.   DKA History of DM-2 Previously on insulin  infusion, subsequently transition to SQ insulin  - Continue comfort measures   Acute metabolic encephalopathy Secondary to diabetic foot/DKA/uremia-suspicion for some amount of cognitive issues as well. Supportive care.   HTN No longer on any antihypertensives -BP was low requiring the addition of midodrine  during hospitalization   History of PAD Supportive care   History of lung cancer with nonspecific right-sided x-ray changes. Supportive care   Goals of care -Family has been struggling with the idea of residential hospice.  Palliative medicine following closely and ongoing goals of care discussions.  Family is from out of town and, son requesting for labs to be drawn today to know where in the process .      Pressure Ulcer: Agree with assessment as outlined below. Wound 04/14/24 0400 Pressure Injury Sacrum Mid;Left Deep Tissue Pressure Injury - Purple or maroon localized area of discolored intact skin or blood-filled blister due to damage of underlying soft tissue from pressure and/or shear. (Active)   Estimated body mass index is 20.81 kg/m as calculated from the following:   Height as of this encounter: 5' 10 (  1.778 m).   Weight as of this encounter: 65.8 kg.  Code Status: DNR comfort care DVT Prophylaxis:     Level of Care: Level of care: Palliative Care Family Communication: Updated patient's son at the bedside Disposition Plan:      Remains inpatient appropriate: TBD   Procedures:  Right BKA 1/16  Consultants:   Palliative medicine Orthopedics  Antimicrobials:   Anti-infectives (From admission, onward)    Start     Dose/Rate Route Frequency Ordered Stop   04/05/24 1500  vancomycin  (VANCOCIN ) IVPB 1000 mg/200 mL premix  Status:  Discontinued         1,000 mg 200 mL/hr over 60 Minutes Intravenous Every 48 hours 04/03/24 1458 04/04/24 1059   04/05/24 0100  vancomycin  (VANCOCIN ) IVPB 1000 mg/200 mL premix  Status:  Discontinued        1,000 mg 200 mL/hr over 60 Minutes Intravenous Every 36 hours 04/04/24 1059 04/04/24 1349   04/04/24 1400  linezolid  (ZYVOX ) tablet 600 mg        600 mg Oral Every 12 hours 04/04/24 1349 04/08/24 1821   04/03/24 2000  metroNIDAZOLE  (FLAGYL ) IVPB 500 mg        500 mg 100 mL/hr over 60 Minutes Intravenous Every 12 hours 04/03/24 1448 04/08/24 2108   04/03/24 1800  ceFEPIme  (MAXIPIME ) 2 g in sodium chloride  0.9 % 100 mL IVPB        2 g 200 mL/hr over 30 Minutes Intravenous Every 24 hours 04/03/24 1454 04/08/24 1859   04/03/24 1145  piperacillin -tazobactam (ZOSYN ) IVPB 3.375 g        3.375 g 100 mL/hr over 30 Minutes Intravenous  Once 04/03/24 1132 04/03/24 1226   04/03/24 1130  vancomycin  (VANCOCIN ) IVPB 1000 mg/200 mL premix  Status:  Discontinued        1,000 mg 200 mL/hr over 60 Minutes Intravenous  Once 04/03/24 1121 04/03/24 1122   04/03/24 1130  vancomycin  (VANCOREADY) IVPB 1500 mg/300 mL        1,500 mg 150 mL/hr over 120 Minutes Intravenous  Once 04/03/24 1122 04/03/24 1456          Medications  antiseptic oral rinse  15 mL Topical BID   bisacodyl   10 mg Rectal Once   LORazepam   0.5 mg Intravenous Q6H   metoCLOPramide  (REGLAN ) injection  5 mg Intravenous Q6H   nystatin   5 mL Mouth/Throat QID   prochlorperazine   10 mg Intravenous Q6H   tamsulosin   0.4 mg Oral Daily      Subjective:   Gene Fletcher was seen and examined today.  Appears comfortable, alert and responding.  No pain.  Son at the bedside.    Objective:   Vitals:   04/18/24 1322 04/18/24 2247 04/19/24 1113 04/20/24 0648  BP: (!) 113/59 128/64 131/74 138/70  Pulse: (!) 103 92 88 78  Resp: 16 17    Temp: 97.6 F (36.4 C) 98.4 F (36.9 C) 98.3 F (36.8 C) 98 F (36.7 C)  TempSrc:  Oral Axillary Oral  SpO2: 100%  100% 100% 100%  Weight:      Height:        Intake/Output Summary (Last 24 hours) at 04/20/2024 1216 Last data filed at 04/20/2024 0653 Gross per 24 hour  Intake 240 ml  Output 1050 ml  Net -810 ml     Wt Readings from Last 3 Encounters:  04/05/24 65.8 kg  04/02/24 65.8 kg  03/26/24 69.9 kg   Physical Exam General: Alert and  awake, frail, ill-appearing, comfortable, Cardiovascular: S1 S2 clear, RRR.  Respiratory: CTAB, no wheezing Gastrointestinal: Soft, nontender, nondistended, NBS Ext: no pedal edema Psych: Normal affect    Data Reviewed:  I have personally reviewed following labs    CBC Lab Results  Component Value Date   WBC 2.3 (L) 04/20/2024   RBC 1.65 (L) 04/20/2024   HGB 5.5 (LL) 04/20/2024   HCT 17.6 (L) 04/20/2024   MCV 106.7 (H) 04/20/2024   MCH 33.3 04/20/2024   PLT 137 (L) 04/20/2024   MCHC 31.3 04/20/2024   RDW 17.4 (H) 04/20/2024   LYMPHSABS 0.3 (L) 04/10/2024   MONOABS 0.8 04/10/2024   EOSABS 0.1 04/10/2024   BASOSABS 0.0 04/10/2024     Last metabolic panel Lab Results  Component Value Date   NA 143 04/20/2024   K 3.5 04/20/2024   CL 109 04/20/2024   CO2 27 04/20/2024   BUN 25 (H) 04/20/2024   CREATININE 1.66 (H) 04/20/2024   GLUCOSE 235 (H) 04/20/2024   GFRNONAA 43 (L) 04/20/2024   GFRAA 46 (L) 06/16/2015   CALCIUM 8.3 (L) 04/20/2024   PROT 7.4 04/03/2024   ALBUMIN 3.2 (L) 04/03/2024   BILITOT 0.5 04/03/2024   ALKPHOS 149 (H) 04/03/2024   AST 16 04/03/2024   ALT 24 04/03/2024   ANIONGAP 7 04/20/2024    CBG (last 3)  No results for input(s): GLUCAP in the last 72 hours.    Coagulation Profile: No results for input(s): INR, PROTIME in the last 168 hours.   Radiology Studies: I have personally reviewed the imaging studies  No results found.     Nydia Distance M.D. Gene Fletcher 04/20/2024, 12:16 PM  Available via Epic secure chat 7am-7pm After 7 pm, please refer to night coverage provider listed on  amion.    "

## 2024-04-20 NOTE — Plan of Care (Signed)
  Problem: Pain Managment: Goal: General experience of comfort will improve and/or be controlled Outcome: Progressing   Problem: Safety: Goal: Ability to remain free from injury will improve Outcome: Progressing

## 2024-04-21 DIAGNOSIS — I96 Gangrene, not elsewhere classified: Secondary | ICD-10-CM | POA: Diagnosis not present

## 2024-04-21 DIAGNOSIS — L089 Local infection of the skin and subcutaneous tissue, unspecified: Secondary | ICD-10-CM | POA: Diagnosis not present

## 2024-04-21 DIAGNOSIS — E11628 Type 2 diabetes mellitus with other skin complications: Secondary | ICD-10-CM | POA: Diagnosis not present

## 2024-04-21 DIAGNOSIS — N184 Chronic kidney disease, stage 4 (severe): Secondary | ICD-10-CM | POA: Diagnosis not present

## 2024-04-21 NOTE — Progress Notes (Signed)
 "          [facility]                                                                              Gene Fletcher, is a 74 y.o. male, DOB - 1950-09-30, FMW:[id number] Admit date - 04/03/2024    Outpatient Primary MD for the patient is Gene Hamilton, MD  LOS - 18  days  Chief Complaint  Patient presents with   Foot Pain       Brief summary   Patient is a 74 year old male with hypertension, HLD, DM type II, CKD 4, lung cancer-s/p radiation, chronic anemia presented with left foot wound infection with wet gangrene.  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  Further hospital course was complicated by worsening anemia, failure to thrive syndrome,  uremia. 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 Unfortunately, even with an Aranesp /B12/folic acid /iron , he continued to decline.  Palliative medicine was consulted, after GOC discussion with family on 1/26, he was transitioned to full comfort measures.    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 Continue comfort care goals   Multifactorial anemia -Secondary to anemia of CKD, acute illness, blood loss from recent surgery -Jehovah's Witness-refused PRBC transfusion -Unfortunately, hemoglobin continued to worsen in spite of Aranesp /IV iron /vitamin B12/folic acid  and minimization of blood draws -patient/family was aware of life-threatening and life disabling consequences.  Patient became more symptomatic as his anemia worsened. -Palliative medicine was consulted, after goals of care extensive discussion, patient was transitioned to full comfort measures on 1/26.   - Labs reviewed with the son, hemoglobin 5.5, creatinine better 1.6   Nausea/vomiting-poor appetite - ongoing even prior to this hospitalization but worsened during this admission --abdominal x-rays were  negative for SBO but did show some stool burden, placed on MiraLAX /laxatives-without any significant improvement --this is thought to be now secondary to uremia.   DKA History of DM-2 Previously on insulin  infusion, subsequently transition to SQ insulin  - Continue comfort measures   Acute metabolic encephalopathy Secondary to diabetic foot/DKA/uremia-suspicion for some amount of cognitive issues as well. Supportive care.   HTN No longer on any antihypertensives -BP was low requiring the addition of midodrine  during hospitalization   History of PAD Supportive care   History of lung cancer with nonspecific right-sided x-ray changes. Supportive care   Goals of care -Family thinking about residential hospice.  Palliative medicine following closely and ongoing goals of care discussions.  -Extensive discussion today regarding end-of-life and residential hospice with patient's daughter and brother at the bedside.   Pressure Ulcer: Agree with assessment as outlined below. Wound 04/14/24 0400 Pressure Injury Sacrum Mid;Left Deep Tissue Pressure Injury - Purple or maroon localized area of discolored intact skin or blood-filled blister due to damage of underlying soft tissue from pressure and/or shear. (Active)   Estimated body mass index is 20.81 kg/m as calculated from the following:   Height as of this encounter: 5' 10 (1.778 m).   Weight as of this encounter: 65.8 kg.  Code Status:  DNR comfort care DVT Prophylaxis:     Level of Care: Level of care: Palliative Care Family Communication: Updated patient's daughter and brother at the bedside. Disposition Plan:      Remains inpatient appropriate: TBD, family considering residential hospice, [facility]   Procedures:  Right BKA 1/16  Consultants:   Palliative medicine Orthopedics  Antimicrobials:   Anti-infectives (From admission, onward)    Start     Dose/Rate Route Frequency Ordered Stop   04/05/24 1500  vancomycin   (VANCOCIN ) IVPB 1000 mg/200 mL premix  Status:  Discontinued        1,000 mg 200 mL/hr over 60 Minutes Intravenous Every 48 hours 04/03/24 1458 04/04/24 1059   04/05/24 0100  vancomycin  (VANCOCIN ) IVPB 1000 mg/200 mL premix  Status:  Discontinued        1,000 mg 200 mL/hr over 60 Minutes Intravenous Every 36 hours 04/04/24 1059 04/04/24 1349   04/04/24 1400  linezolid  (ZYVOX ) tablet 600 mg        600 mg Oral Every 12 hours 04/04/24 1349 04/08/24 1821   04/03/24 2000  metroNIDAZOLE  (FLAGYL ) IVPB 500 mg        500 mg 100 mL/hr over 60 Minutes Intravenous Every 12 hours 04/03/24 1448 04/08/24 2108   04/03/24 1800  ceFEPIme  (MAXIPIME ) 2 g in sodium chloride  0.9 % 100 mL IVPB        2 g 200 mL/hr over 30 Minutes Intravenous Every 24 hours 04/03/24 1454 04/08/24 1859   04/03/24 1145  piperacillin -tazobactam (ZOSYN ) IVPB 3.375 g        3.375 g 100 mL/hr over 30 Minutes Intravenous  Once 04/03/24 1132 04/03/24 1226   04/03/24 1130  vancomycin  (VANCOCIN ) IVPB 1000 mg/200 mL premix  Status:  Discontinued        1,000 mg 200 mL/hr over 60 Minutes Intravenous  Once 04/03/24 1121 04/03/24 1122   04/03/24 1130  vancomycin  (VANCOREADY) IVPB 1500 mg/300 mL        1,500 mg 150 mL/hr over 120 Minutes Intravenous  Once 04/03/24 1122 04/03/24 1456          Medications  antiseptic oral rinse  15 mL Topical BID   bisacodyl   10 mg Rectal Once   LORazepam   0.5 mg Intravenous Q6H   metoCLOPramide  (REGLAN ) injection  5 mg Intravenous Q6H   nystatin   5 mL Mouth/Throat QID   prochlorperazine   10 mg Intravenous Q6H   tamsulosin   0.4 mg Oral Daily      Subjective:   Gene Fletcher was seen and examined today.  Somewhat somnolent today but arousable and responds, recognizes his daughter and the brother at the bedside.  Appears comfortable.   Objective:   Vitals:   04/18/24 2247 04/19/24 1113 04/20/24 0648 04/21/24 0551  BP: 128/64 131/74 138/70 115/74  Pulse: 92 88 78 81  Resp: 17     Temp:  98.4 F (36.9 C) 98.3 F (36.8 C) 98 F (36.7 C) 98.5 F (36.9 C)  TempSrc: Oral Axillary Oral Oral  SpO2: 100% 100% 100% 100%  Weight:      Height:        Intake/Output Summary (Last 24 hours) at 04/21/2024 1308 Last data filed at 04/21/2024 0552 Gross per 24 hour  Intake --  Output 650 ml  Net -650 ml     Wt Readings from Last 3 Encounters:  04/05/24 65.8 kg  04/02/24 65.8 kg  03/26/24 69.9 kg   Physical Exam General: Alert but easily arousable, ill-appearing, comfortable  Cardiovascular: S1 S2 clear, RRR.  Respiratory: CTAB, no wheezing Gastrointestinal: Soft, nontender, nondistended, NBS Ext: no pedal edema bilaterally Psych: Somnolent   Data Reviewed:  I have personally reviewed following labs    CBC Lab Results  Component Value Date   WBC 2.3 (L) 04/20/2024   RBC 1.65 (L) 04/20/2024   HGB 5.5 (LL) 04/20/2024   HCT 17.6 (L) 04/20/2024   MCV 106.7 (H) 04/20/2024   MCH 33.3 04/20/2024   PLT 137 (L) 04/20/2024   MCHC 31.3 04/20/2024   RDW 17.4 (H) 04/20/2024   LYMPHSABS 0.3 (L) 04/10/2024   MONOABS 0.8 04/10/2024   EOSABS 0.1 04/10/2024   BASOSABS 0.0 04/10/2024     Last metabolic panel Lab Results  Component Value Date   NA 143 04/20/2024   K 3.5 04/20/2024   CL 109 04/20/2024   CO2 27 04/20/2024   BUN 25 (H) 04/20/2024   CREATININE 1.66 (H) 04/20/2024   GLUCOSE 235 (H) 04/20/2024   GFRNONAA 43 (L) 04/20/2024   GFRAA 46 (L) 06/16/2015   CALCIUM 8.3 (L) 04/20/2024   PROT 7.4 04/03/2024   ALBUMIN 3.2 (L) 04/03/2024   BILITOT 0.5 04/03/2024   ALKPHOS 149 (H) 04/03/2024   AST 16 04/03/2024   ALT 24 04/03/2024   ANIONGAP 7 04/20/2024    CBG (last 3)  No results for input(s): GLUCAP in the last 72 hours.    Coagulation Profile: No results for input(s): INR, PROTIME in the last 168 hours.   Radiology Studies: I have personally reviewed the imaging studies  No results found.     Nydia Distance M.D. [facility] 04/21/2024,  1:08 PM  Available via Epic secure chat 7am-7pm After 7 pm, please refer to night coverage provider listed on amion.    "

## 2024-04-21 NOTE — Plan of Care (Signed)
   Problem: Coping: Goal: Level of anxiety will decrease Outcome: Progressing   Problem: Pain Managment: Goal: General experience of comfort will improve and/or be controlled Outcome: Progressing

## 2024-04-21 NOTE — Plan of Care (Signed)
 " Problem: Education: Goal: Ability to describe self-care measures that may prevent or decrease complications (Diabetes Survival Skills Education) will improve Outcome: Progressing Goal: Individualized Educational Video(s) Outcome: Progressing   Problem: Coping: Goal: Ability to adjust to condition or change in health will improve Outcome: Progressing   Problem: Fluid Volume: Goal: Ability to maintain a balanced intake and output will improve Outcome: Progressing   Problem: Health Behavior/Discharge Planning: Goal: Ability to identify and utilize available resources and services will improve Outcome: Progressing Goal: Ability to manage health-related needs will improve Outcome: Progressing   Problem: Metabolic: Goal: Ability to maintain appropriate glucose levels will improve Outcome: Progressing   Problem: Nutritional: Goal: Maintenance of adequate nutrition will improve Outcome: Progressing Goal: Progress toward achieving an optimal weight will improve Outcome: Progressing   Problem: Skin Integrity: Goal: Risk for impaired skin integrity will decrease Outcome: Progressing   Problem: Tissue Perfusion: Goal: Adequacy of tissue perfusion will improve Outcome: Progressing   Problem: Education: Goal: Ability to describe self-care measures that may prevent or decrease complications (Diabetes Survival Skills Education) will improve Outcome: Progressing Goal: Individualized Educational Video(s) Outcome: Progressing   Problem: Cardiac: Goal: Ability to maintain an adequate cardiac output will improve Outcome: Progressing   Problem: Health Behavior/Discharge Planning: Goal: Ability to identify and utilize available resources and services will improve Outcome: Progressing Goal: Ability to manage health-related needs will improve Outcome: Progressing   Problem: Fluid Volume: Goal: Ability to achieve a balanced intake and output will improve Outcome: Progressing    Problem: Metabolic: Goal: Ability to maintain appropriate glucose levels will improve Outcome: Progressing   Problem: Nutritional: Goal: Maintenance of adequate nutrition will improve Outcome: Progressing Goal: Maintenance of adequate weight for body size and type will improve Outcome: Progressing   Problem: Respiratory: Goal: Will regain and/or maintain adequate ventilation Outcome: Progressing   Problem: Urinary Elimination: Goal: Ability to achieve and maintain adequate renal perfusion and functioning will improve Outcome: Progressing   Problem: Education: Goal: Knowledge of General Education information will improve Description: Including pain rating scale, medication(s)/side effects and non-pharmacologic comfort measures Outcome: Progressing   Problem: Health Behavior/Discharge Planning: Goal: Ability to manage health-related needs will improve Outcome: Progressing   Problem: Clinical Measurements: Goal: Ability to maintain clinical measurements within normal limits will improve Outcome: Progressing Goal: Will remain free from infection Outcome: Progressing Goal: Diagnostic test results will improve Outcome: Progressing Goal: Respiratory complications will improve Outcome: Progressing Goal: Cardiovascular complication will be avoided Outcome: Progressing   Problem: Activity: Goal: Risk for activity intolerance will decrease Outcome: Progressing   Problem: Nutrition: Goal: Adequate nutrition will be maintained Outcome: Progressing   Problem: Coping: Goal: Level of anxiety will decrease Outcome: Progressing   Problem: Elimination: Goal: Will not experience complications related to bowel motility Outcome: Progressing Goal: Will not experience complications related to urinary retention Outcome: Progressing   Problem: Pain Managment: Goal: General experience of comfort will improve and/or be controlled Outcome: Progressing   Problem: Safety: Goal:  Ability to remain free from injury will improve Outcome: Progressing   Problem: Skin Integrity: Goal: Risk for impaired skin integrity will decrease Outcome: Progressing   Problem: Education: Goal: Knowledge of the prescribed therapeutic regimen will improve Outcome: Progressing   Problem: Bowel/Gastric: Goal: Gastrointestinal status for postoperative course will improve Outcome: Progressing   Problem: Cardiac: Goal: Ability to maintain an adequate cardiac output Outcome: Progressing Goal: Will show no evidence of cardiac arrhythmias Outcome: Progressing   Problem: Nutritional: Goal: Will attain and maintain optimal nutritional status Outcome:  Progressing   Problem: Neurological: Goal: Will regain or maintain usual level of consciousness Outcome: Progressing   Problem: Clinical Measurements: Goal: Ability to maintain clinical measurements within normal limits Outcome: Progressing Goal: Postoperative complications will be avoided or minimized Outcome: Progressing   "

## 2024-04-22 DIAGNOSIS — N184 Chronic kidney disease, stage 4 (severe): Secondary | ICD-10-CM | POA: Diagnosis not present

## 2024-04-22 DIAGNOSIS — L089 Local infection of the skin and subcutaneous tissue, unspecified: Secondary | ICD-10-CM | POA: Diagnosis not present

## 2024-04-22 DIAGNOSIS — E11628 Type 2 diabetes mellitus with other skin complications: Secondary | ICD-10-CM | POA: Diagnosis not present

## 2024-04-22 DIAGNOSIS — I96 Gangrene, not elsewhere classified: Secondary | ICD-10-CM | POA: Diagnosis not present

## 2024-04-22 NOTE — TOC Progression Note (Addendum)
 Transition of Care (TOC) - Progression Note   Received secure chat to discuss residual hospice. Patient's brother at bedside.  Federico is on her way to [place] to pick up her mother who has dementia . NCM talked to patient's  brother gave him my direct cell , he will give it to Bernel and ask her to cal NCM . We discussed the benefits of residual hospice . Await call from [facility]   1612 Daughter Bernel called NCM back. Discussed residential hospice, offered choice. Bernel prefers [facility] , referral to [place] with [facility]  Patient Details  Name: VERTIS SCHEIB MRN: [id number] Date of Birth: 1950/12/13  Transition of Care [facility]) CM/SW Contact  Shonte Beutler, Powell Jansky, RN Phone Number: 04/22/2024, 12:32 PM  Clinical Narrative:       Expected Discharge Plan:  (comfort care) Barriers to Discharge: Continued Medical Work up (comfort care in hospital)               Expected Discharge Plan and Services In-house Referral: Clinical Social Work   Post Acute Care Choice: Skilled Nursing Facility Living arrangements for the past 2 months: Single Family Home                                       Social Drivers of Health (SDOH) Interventions SDOH Screenings   Food Insecurity: No Food Insecurity (04/03/2024)  Housing: Low Risk (04/03/2024)  Transportation Needs: No Transportation Needs (04/03/2024)  Utilities: Not At Risk (04/03/2024)  Depression (PHQ2-9): Low Risk (11/07/2023)  Social Connections: Socially Integrated (04/03/2024)  Tobacco Use: Low Risk (04/05/2024)    Readmission Risk Interventions     No data to display

## 2024-04-23 DIAGNOSIS — E782 Mixed hyperlipidemia: Secondary | ICD-10-CM

## 2024-04-23 DIAGNOSIS — I96 Gangrene, not elsewhere classified: Secondary | ICD-10-CM | POA: Diagnosis not present

## 2024-04-23 DIAGNOSIS — I10 Essential (primary) hypertension: Secondary | ICD-10-CM | POA: Diagnosis not present

## 2024-04-23 DIAGNOSIS — N184 Chronic kidney disease, stage 4 (severe): Secondary | ICD-10-CM | POA: Diagnosis not present

## 2024-04-23 DIAGNOSIS — E11628 Type 2 diabetes mellitus with other skin complications: Secondary | ICD-10-CM | POA: Diagnosis not present

## 2024-04-23 NOTE — Consult Note (Signed)
" °  CLINICAL SUPPORT TEAM - WOUND OSTOMY AND CONTINENCE TEAM  CONSULTATION SERVICES   WOC Nurse-Inpatient Note  WOC Nurse wound follow up Wound type: Buttock; Sacrum DTPI with epidermal sloughing; evolving Measurement: wide area of involvement with center full-thickness of about 3x2x0.25 cm Wound bed: maroon, non-viable tissue, pink with some subcutaneous fat showing in the center Drainage (amount, consistency, odor) malodor, serosanguinous fluid Periwound: atrophic tissue, dehydrated crusty tissue, due to darkly pigmented skin unable to determine if erythema is present, may continue to deepen, wider area may also begin to shoe epidermal sloughing, no bone exposure noted, could continue to evolve to a stage 3 PI, spoke to family at the bedside and explained DTPI vs other PI staging and that image was taken.  Dressing procedure/placement/frequency: QD  Continue with current conservative treatment Cleanse sacral wound with Vashe, do not rinse. Apply Xeroform gauze (TI#[id number]) to wound bed daily and secure with silicone foam.    Buttock 04/23/24     WOC will follow patient and see within 10 days. Provider note indicated possible hospice benefit, coordinated care with primary nurse via Secure Chat; new dressing will need to be applied.  Please reconsult if wound worsens in condition and notify provider.   Sherrilyn Hals MSN RN CWOCN WOC [facility]  [phone] (Available from 7-3 pm Mon-Friday)      "

## 2024-04-23 NOTE — Progress Notes (Signed)
 "          [facility]                                                                              Kimi Kroft, is a 74 y.o. male, DOB - 04-Sep-1950, FMW:[id number] Admit date - 04/03/2024    Outpatient Primary MD for the patient is [name], MD  LOS - 20  days  Chief Complaint  Patient presents with   Foot Pain       Brief summary   Patient is a 74 year old male with hypertension, HLD, DM type II, CKD 4, lung cancer-s/p radiation, chronic anemia presented with left foot wound infection with wet gangrene.  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  Further hospital course was complicated by worsening anemia, failure to thrive syndrome,  uremia. 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 Unfortunately, even with an Aranesp /B12/folic acid /iron , he continued to decline.  Palliative medicine was consulted, after GOC discussion with family on 1/26, he was transitioned to full comfort measures.    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 Continue comfort care goals   Multifactorial anemia -Secondary to anemia of CKD, acute illness, blood loss from recent surgery -Jehovah's Witness-refused PRBC transfusion -Unfortunately, hemoglobin continued to worsen in spite of Aranesp /IV iron /vitamin B12/folic acid  and minimization of blood draws -patient/family was aware of life-threatening and life disabling consequences.  Patient became more symptomatic as his anemia worsened. -Palliative medicine was consulted, after GOC discussion, transitioned to full comfort measures on 1/26.   - Labs 1/31 reviewed with family, Hb 5.5, creatinine better 1.6   Nausea/vomiting-poor appetite - ongoing even prior to this hospitalization but worsened during this admission --abdominal x-rays were negative for SBO but did show some  stool burden, placed on MiraLAX /laxatives-without any significant improvement --this is thought to be now secondary to uremia.   DKA History of DM-2 Previously on insulin  infusion, subsequently transition to SQ insulin  - Continue comfort measures   Acute metabolic encephalopathy Secondary to diabetic foot/DKA/uremia-suspicion for some amount of cognitive issues as well. Supportive care.   HTN No longer on any antihypertensives -BP was low requiring the addition of midodrine  during hospitalization   History of PAD Supportive care   History of lung cancer with nonspecific right-sided x-ray changes. Supportive care   Goals of care - Family considering residential hospice, palliative medicine following.     Pressure Ulcer: Agree with assessment as outlined below. Wound 04/14/24 0400 Pressure Injury Sacrum Mid;Left Deep Tissue Pressure Injury - Purple or maroon localized area of discolored intact skin or blood-filled blister due to damage of underlying soft tissue from pressure and/or shear. (Active)   Estimated body mass index is 20.81 kg/m as calculated from the following:   Height as of this encounter: 5' 10 (1.778 m).   Weight as of this encounter: 65.8 kg.  Code Status: DNR comfort care DVT Prophylaxis:     Level of Care: Level of care: Palliative Care Family Communication: Updated patient's brother at the bedside Disposition Plan:  Remains inpatient appropriate: TBD, family considering residential hospice, [facility]   Procedures:  Right BKA 1/16  Consultants:   Palliative medicine Orthopedics  Antimicrobials:   Anti-infectives (From admission, onward)    Start     Dose/Rate Route Frequency Ordered Stop   04/05/24 1500  vancomycin  (VANCOCIN ) IVPB 1000 mg/200 mL premix  Status:  Discontinued        1,000 mg 200 mL/hr over 60 Minutes Intravenous Every 48 hours 04/03/24 1458 04/04/24 1059   04/05/24 0100  vancomycin  (VANCOCIN ) IVPB 1000 mg/200 mL premix   Status:  Discontinued        1,000 mg 200 mL/hr over 60 Minutes Intravenous Every 36 hours 04/04/24 1059 04/04/24 1349   04/04/24 1400  linezolid  (ZYVOX ) tablet 600 mg        600 mg Oral Every 12 hours 04/04/24 1349 04/08/24 1821   04/03/24 2000  metroNIDAZOLE  (FLAGYL ) IVPB 500 mg        500 mg 100 mL/hr over 60 Minutes Intravenous Every 12 hours 04/03/24 1448 04/08/24 2108   04/03/24 1800  ceFEPIme  (MAXIPIME ) 2 g in sodium chloride  0.9 % 100 mL IVPB        2 g 200 mL/hr over 30 Minutes Intravenous Every 24 hours 04/03/24 1454 04/08/24 1859   04/03/24 1145  piperacillin -tazobactam (ZOSYN ) IVPB 3.375 g        3.375 g 100 mL/hr over 30 Minutes Intravenous  Once 04/03/24 1132 04/03/24 1226   04/03/24 1130  vancomycin  (VANCOCIN ) IVPB 1000 mg/200 mL premix  Status:  Discontinued        1,000 mg 200 mL/hr over 60 Minutes Intravenous  Once 04/03/24 1121 04/03/24 1122   04/03/24 1130  vancomycin  (VANCOREADY) IVPB 1500 mg/300 mL        1,500 mg 150 mL/hr over 120 Minutes Intravenous  Once 04/03/24 1122 04/03/24 1456          Medications  antiseptic oral rinse  15 mL Topical BID   bisacodyl   10 mg Rectal Once   LORazepam   0.5 mg Intravenous Q6H   nystatin   5 mL Mouth/Throat QID   prochlorperazine   10 mg Intravenous Q6H   tamsulosin   0.4 mg Oral Daily      Subjective:   Perri Lamagna was seen and examined today.  Much more alert and awake, conversing and eating with the help of the brother at the bedside.  Appears comfortable   Objective:   Vitals:   04/19/24 1113 04/20/24 0648 04/21/24 0551 04/23/24 0514  BP: 131/74 138/70 115/74 108/64  Pulse: 88 78 81 95  Resp:    18  Temp: 98.3 F (36.8 C) 98 F (36.7 C) 98.5 F (36.9 C) (!) 97.4 F (36.3 C)  TempSrc: Axillary Oral Oral Oral  SpO2: 100% 100% 100% 100%  Weight:      Height:        Intake/Output Summary (Last 24 hours) at 04/23/2024 1421 Last data filed at 04/23/2024 1000 Gross per 24 hour  Intake 480 ml  Output  575 ml  Net -95 ml     Wt Readings from Last 3 Encounters:  04/05/24 65.8 kg  04/02/24 65.8 kg  03/26/24 69.9 kg   Physical Exam General: Alert and oriented NAD, frail and ill-appearing but appears comfortable Cardiovascular: S1 S2 clear, RRR.  Respiratory: CTAB, no wheezing Gastrointestinal: Soft, nontender, nondistended, NBS Ext: no pedal edema bilaterally Psych: Normal affect, pleasant  Data Reviewed:  I have personally reviewed following labs  CBC Lab Results  Component Value Date   WBC 2.3 (L) 04/20/2024   RBC 1.65 (L) 04/20/2024   HGB 5.5 (LL) 04/20/2024   HCT 17.6 (L) 04/20/2024   MCV 106.7 (H) 04/20/2024   MCH 33.3 04/20/2024   PLT 137 (L) 04/20/2024   MCHC 31.3 04/20/2024   RDW 17.4 (H) 04/20/2024   LYMPHSABS 0.3 (L) 04/10/2024   MONOABS 0.8 04/10/2024   EOSABS 0.1 04/10/2024   BASOSABS 0.0 04/10/2024     Last metabolic panel Lab Results  Component Value Date   NA 143 04/20/2024   K 3.5 04/20/2024   CL 109 04/20/2024   CO2 27 04/20/2024   BUN 25 (H) 04/20/2024   CREATININE 1.66 (H) 04/20/2024   GLUCOSE 235 (H) 04/20/2024   GFRNONAA 43 (L) 04/20/2024   GFRAA 46 (L) 06/16/2015   CALCIUM 8.3 (L) 04/20/2024   PROT 7.4 04/03/2024   ALBUMIN 3.2 (L) 04/03/2024   BILITOT 0.5 04/03/2024   ALKPHOS 149 (H) 04/03/2024   AST 16 04/03/2024   ALT 24 04/03/2024   ANIONGAP 7 04/20/2024    CBG (last 3)  No results for input(s): GLUCAP in the last 72 hours.    Coagulation Profile: No results for input(s): INR, PROTIME in the last 168 hours.   Radiology Studies: I have personally reviewed the imaging studies  No results found.     Nydia Distance M.D. [facility] 04/23/2024, 2:21 PM  Available via Epic secure chat 7am-7pm After 7 pm, please refer to night coverage provider listed on amion.    "

## 2024-04-23 NOTE — Progress Notes (Signed)
 [id number] [facility] Liaison Note  Received request from Care Manager for family interest in [facility].  Chart reviewed and spoke with family to acknowledge referral.  Hospice eligibility confirmed.  Unfortunately, [facility] is not able to offer a room today.  Family and Care Manager aware hospital liaison will follow up tomorrow or sooner if a room becomes available. Please do not hesitate to call with questions.  Thank you Inocente Jacobs RN BSN [facility] Liaison [phone]

## 2024-04-24 NOTE — Progress Notes (Signed)
 "          [facility]                                                                              Gene Fletcher, is a 74 y.o. male, DOB - Mar 29, 1950, FMW:[id number] Admit date - 04/03/2024    Outpatient Primary MD for the patient is Gene Hamilton, MD  LOS - 21  days  Chief Complaint  Patient presents with   Foot Pain       Brief summary   Patient is a 74 year old male with hypertension, HLD, DM type II, CKD 4, lung cancer-s/p radiation, chronic anemia presented with left foot wound infection with wet gangrene.  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  Further hospital course was complicated by worsening anemia, failure to thrive syndrome,  uremia. 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 Unfortunately, even with an Aranesp /B12/folic acid /iron , he continued to decline.  Palliative medicine was consulted, after GOC discussion with family on 1/26, he was transitioned to full comfort measures.    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 Continue comfort care goals   Multifactorial anemia -Secondary to anemia of CKD, acute illness, blood loss from recent surgery -Jehovah's Witness-refused PRBC transfusion -Unfortunately, hemoglobin continued to worsen in spite of Aranesp /IV iron /vitamin B12/folic acid  and minimization of blood draws -patient/family was aware of life-threatening and life disabling consequences.  Patient became more symptomatic as his anemia worsened. -Palliative medicine was consulted, after GOC discussion, transitioned to full comfort measures on 1/26.   - Labs 1/31 reviewed with family, Hb 5.5, creatinine better 1.6   Nausea/vomiting-poor appetite - ongoing even prior to this hospitalization but worsened during this admission --abdominal x-rays were negative for SBO but did show some  stool burden, placed on MiraLAX /laxatives-without any significant improvement --this is thought to be now secondary to uremia.   DKA History of DM-2 Previously on insulin  infusion, subsequently transition to SQ insulin  - Continue comfort measures   Acute metabolic encephalopathy Secondary to diabetic foot/DKA/uremia-suspicion for some amount of cognitive issues as well. Supportive care.   HTN No longer on any antihypertensives -BP was low requiring the addition of midodrine  during hospitalization   History of PAD Supportive care   History of lung cancer with nonspecific right-sided x-ray changes. Supportive care   Goals of care - Family considering residential hospice, palliative medicine following.     Pressure Ulcer: Agree with assessment as outlined below. Wound 04/14/24 0400 Pressure Injury Sacrum Mid;Left Deep Tissue Pressure Injury - Purple or maroon localized area of discolored intact skin or blood-filled blister due to damage of underlying soft tissue from pressure and/or shear. (Active)   Estimated body mass index is 20.81 kg/m as calculated from the following:   Height as of this encounter: 5' 10 (1.778 m).   Weight as of this encounter: 65.8 kg.  Code Status: DNR comfort care DVT Prophylaxis:     Level of Care: Level of care: Palliative Care Family Communication: Updated patient's brother at the bedside Disposition Plan:  Remains inpatient appropriate: TBD, family considering residential hospice, [facility]   Procedures:  Right BKA 1/16  Consultants:   Palliative medicine Orthopedics  Antimicrobials:   Anti-infectives (From admission, onward)    Start     Dose/Rate Route Frequency Ordered Stop   04/05/24 1500  vancomycin  (VANCOCIN ) IVPB 1000 mg/200 mL premix  Status:  Discontinued        1,000 mg 200 mL/hr over 60 Minutes Intravenous Every 48 hours 04/03/24 1458 04/04/24 1059   04/05/24 0100  vancomycin  (VANCOCIN ) IVPB 1000 mg/200 mL premix   Status:  Discontinued        1,000 mg 200 mL/hr over 60 Minutes Intravenous Every 36 hours 04/04/24 1059 04/04/24 1349   04/04/24 1400  linezolid  (ZYVOX ) tablet 600 mg        600 mg Oral Every 12 hours 04/04/24 1349 04/08/24 1821   04/03/24 2000  metroNIDAZOLE  (FLAGYL ) IVPB 500 mg        500 mg 100 mL/hr over 60 Minutes Intravenous Every 12 hours 04/03/24 1448 04/08/24 2108   04/03/24 1800  ceFEPIme  (MAXIPIME ) 2 g in sodium chloride  0.9 % 100 mL IVPB        2 g 200 mL/hr over 30 Minutes Intravenous Every 24 hours 04/03/24 1454 04/08/24 1859   04/03/24 1145  piperacillin -tazobactam (ZOSYN ) IVPB 3.375 g        3.375 g 100 mL/hr over 30 Minutes Intravenous  Once 04/03/24 1132 04/03/24 1226   04/03/24 1130  vancomycin  (VANCOCIN ) IVPB 1000 mg/200 mL premix  Status:  Discontinued        1,000 mg 200 mL/hr over 60 Minutes Intravenous  Once 04/03/24 1121 04/03/24 1122   04/03/24 1130  vancomycin  (VANCOREADY) IVPB 1500 mg/300 mL        1,500 mg 150 mL/hr over 120 Minutes Intravenous  Once 04/03/24 1122 04/03/24 1456          Medications  antiseptic oral rinse  15 mL Topical BID   bisacodyl   10 mg Rectal Once   LORazepam   0.5 mg Intravenous Q6H   nystatin   5 mL Mouth/Throat QID   prochlorperazine   10 mg Intravenous Q6H   tamsulosin   0.4 mg Oral Daily      Subjective:   Gene Fletcher was seen and examined today.  Today somewhat somnolent but easily arousable, appears comfortable.  Brother at the bedside.    Objective:   Vitals:   04/21/24 0551 04/23/24 0514 04/24/24 0517 04/24/24 0900  BP: 115/74 108/64 115/65 107/62  Pulse: 81 95 76 83  Resp:  18 18 16   Temp: 98.5 F (36.9 C) (!) 97.4 F (36.3 C) (!) 97.4 F (36.3 C) 98 F (36.7 C)  TempSrc: Oral Oral Oral Oral  SpO2: 100% 100% 100% 100%  Weight:      Height:        Intake/Output Summary (Last 24 hours) at 04/24/2024 1259 Last data filed at 04/24/2024 0951 Gross per 24 hour  Intake 180 ml  Output 850 ml  Net  -670 ml     Wt Readings from Last 3 Encounters:  04/05/24 65.8 kg  04/02/24 65.8 kg  03/26/24 69.9 kg    Physical Exam General: Somnolent but arousable, comfortable Cardiovascular: S1 S2 clear, RRR.  Respiratory: CTAB, no wheezing Gastrointestinal: Soft, nontender, nondistended, NBS Ext: no pedal edema bilaterally Psych: Somnolent but arousable   Data Reviewed:  I have personally reviewed following labs    CBC Lab Results  Component Value Date  WBC 2.3 (L) 04/20/2024   RBC 1.65 (L) 04/20/2024   HGB 5.5 (LL) 04/20/2024   HCT 17.6 (L) 04/20/2024   MCV 106.7 (H) 04/20/2024   MCH 33.3 04/20/2024   PLT 137 (L) 04/20/2024   MCHC 31.3 04/20/2024   RDW 17.4 (H) 04/20/2024   LYMPHSABS 0.3 (L) 04/10/2024   MONOABS 0.8 04/10/2024   EOSABS 0.1 04/10/2024   BASOSABS 0.0 04/10/2024     Last metabolic panel Lab Results  Component Value Date   NA 143 04/20/2024   K 3.5 04/20/2024   CL 109 04/20/2024   CO2 27 04/20/2024   BUN 25 (H) 04/20/2024   CREATININE 1.66 (H) 04/20/2024   GLUCOSE 235 (H) 04/20/2024   GFRNONAA 43 (L) 04/20/2024   GFRAA 46 (L) 06/16/2015   CALCIUM 8.3 (L) 04/20/2024   PROT 7.4 04/03/2024   ALBUMIN 3.2 (L) 04/03/2024   BILITOT 0.5 04/03/2024   ALKPHOS 149 (H) 04/03/2024   AST 16 04/03/2024   ALT 24 04/03/2024   ANIONGAP 7 04/20/2024    CBG (last 3)  No results for input(s): GLUCAP in the last 72 hours.    Coagulation Profile: No results for input(s): INR, PROTIME in the last 168 hours.   Radiology Studies: I have personally reviewed the imaging studies  No results found.     Nydia Distance M.D. [facility] 04/24/2024, 12:59 PM  Available via Epic secure chat 7am-7pm After 7 pm, please refer to night coverage provider listed on amion.    "

## 2024-04-25 NOTE — Progress Notes (Signed)
 [facility] [phone] [facility]  Hospice hospital liaison note   Received previous request for family interest in hospice inpatient unit.    At this time patient does not meet criteria for hospice inpatient unit.    He is appropriate for hospice services in the home or LTC facility and we will follow to assess for decline and inpatient hospice appropriateness.    Thank you for the opportunity to participate in this patient's care.    Eleanor Nail, LPN Hospice hospital liaison [phone]

## 2024-04-25 NOTE — Progress Notes (Signed)
 "          [facility]                                                                              Gene Fletcher, is a 74 y.o. male, DOB - 06-22-1950, FMW:[id number] Admit date - 04/03/2024    Outpatient Primary MD for the patient is [name], MD  LOS - 22  days  Chief Complaint  Patient presents with   Foot Pain       Brief summary   Patient is a 74 year old male with hypertension, HLD, DM type II, CKD 4, lung cancer-s/p radiation, chronic anemia presented with left foot wound infection with wet gangrene.  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  Further hospital course was complicated by worsening anemia, failure to thrive syndrome,  uremia. 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 Unfortunately, even with an Aranesp /B12/folic acid /iron , he continued to decline.  Palliative medicine was consulted, after GOC discussion with family on 1/26, he was transitioned to full comfort measures.    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 Continue comfort care goals   Multifactorial anemia -Secondary to anemia of CKD, acute illness, blood loss from recent surgery -Jehovah's Witness-refused PRBC transfusion -Unfortunately, hemoglobin continued to worsen in spite of Aranesp /IV iron /vitamin B12/folic acid  and minimization of blood draws -patient/family was aware of life-threatening and life disabling consequences.  Patient became more symptomatic as his anemia worsened. -Palliative medicine was consulted, after GOC discussion, transitioned to full comfort measures on 1/26.   - Labs 1/31 reviewed with family, Hb 5.5, creatinine 1.6   Nausea/vomiting-poor appetite - ongoing even prior to this hospitalization but worsened during this admission --abdominal x-rays were negative for SBO but did show some stool  burden, placed on MiraLAX /laxatives-without any significant improvement -- thought to be now secondary to uremia.   DKA History of DM-2 Previously on insulin  infusion, subsequently transition to SQ insulin  - Continue comfort measures   Acute metabolic encephalopathy Secondary to diabetic foot/DKA/uremia-suspicion for some amount of cognitive issues as well. Supportive care.   HTN No longer on any antihypertensives -BP was low requiring the addition of midodrine  during hospitalization   History of PAD Supportive care   History of lung cancer with nonspecific right-sided x-ray changes. Supportive care   Goals of care - Family considering residential hospice, palliative medicine following.     Pressure Ulcer: Agree with assessment as outlined below. Wound 04/14/24 0400 Pressure Injury Sacrum Mid;Left Deep Tissue Pressure Injury - Purple or maroon localized area of discolored intact skin or blood-filled blister due to damage of underlying soft tissue from pressure and/or shear. (Active)   Estimated body mass index is 20.81 kg/m as calculated from the following:   Height as of this encounter: 5' 10 (1.778 m).   Weight as of this encounter: 65.8 kg.  Code Status: DNR comfort care DVT Prophylaxis:     Level of Care: Level of care: Palliative Care Family Communication: Updated patient's brother at the bedside Disposition Plan:  Remains inpatient appropriate: TBD.  I was notified that patient does not meet criteria for residential hospice, brother wants to discuss with palliative and TOC about options.   Procedures:  Right BKA 1/16  Consultants:   Palliative medicine Orthopedics  Antimicrobials:   Anti-infectives (From admission, onward)    Start     Dose/Rate Route Frequency Ordered Stop   04/05/24 1500  vancomycin  (VANCOCIN ) IVPB 1000 mg/200 mL premix  Status:  Discontinued        1,000 mg 200 mL/hr over 60 Minutes Intravenous Every 48 hours 04/03/24 1458  04/04/24 1059   04/05/24 0100  vancomycin  (VANCOCIN ) IVPB 1000 mg/200 mL premix  Status:  Discontinued        1,000 mg 200 mL/hr over 60 Minutes Intravenous Every 36 hours 04/04/24 1059 04/04/24 1349   04/04/24 1400  linezolid  (ZYVOX ) tablet 600 mg        600 mg Oral Every 12 hours 04/04/24 1349 04/08/24 1821   04/03/24 2000  metroNIDAZOLE  (FLAGYL ) IVPB 500 mg        500 mg 100 mL/hr over 60 Minutes Intravenous Every 12 hours 04/03/24 1448 04/08/24 2108   04/03/24 1800  ceFEPIme  (MAXIPIME ) 2 g in sodium chloride  0.9 % 100 mL IVPB        2 g 200 mL/hr over 30 Minutes Intravenous Every 24 hours 04/03/24 1454 04/08/24 1859   04/03/24 1145  piperacillin -tazobactam (ZOSYN ) IVPB 3.375 g        3.375 g 100 mL/hr over 30 Minutes Intravenous  Once 04/03/24 1132 04/03/24 1226   04/03/24 1130  vancomycin  (VANCOCIN ) IVPB 1000 mg/200 mL premix  Status:  Discontinued        1,000 mg 200 mL/hr over 60 Minutes Intravenous  Once 04/03/24 1121 04/03/24 1122   04/03/24 1130  vancomycin  (VANCOREADY) IVPB 1500 mg/300 mL        1,500 mg 150 mL/hr over 120 Minutes Intravenous  Once 04/03/24 1122 04/03/24 1456          Medications  antiseptic oral rinse  15 mL Topical BID   bisacodyl   10 mg Rectal Once   LORazepam   0.5 mg Intravenous Q6H   nystatin   5 mL Mouth/Throat QID   prochlorperazine   10 mg Intravenous Q6H   tamsulosin   0.4 mg Oral Daily      Subjective:   Gene Fletcher was seen and examined today.  Somnolent but easily arousable, brother at the bedside.  No acute issues overnight.  Pain controlled.   Objective:   Vitals:   04/23/24 0514 04/24/24 0517 04/24/24 0900 04/25/24 0518  BP: 108/64 115/65 107/62 134/72  Pulse: 95 76 83 (!) 110  Resp: 18 18 16 18   Temp: (!) 97.4 F (36.3 C) (!) 97.4 F (36.3 C) 98 F (36.7 C) 98.1 F (36.7 C)  TempSrc: Oral Oral Oral Oral  SpO2: 100% 100% 100% 100%  Weight:      Height:        Intake/Output Summary (Last 24 hours) at 04/25/2024  1344 Last data filed at 04/25/2024 0500 Gross per 24 hour  Intake 160 ml  Output 1450 ml  Net -1290 ml     Wt Readings from Last 3 Encounters:  04/05/24 65.8 kg  04/02/24 65.8 kg  03/26/24 69.9 kg   Physical Exam General: Somnolent but arousable, appears comfortable, ill-appearing Cardiovascular: S1 S2 clear, RRR.  Respiratory: CTAB, no wheezing Gastrointestinal: Soft, nontender, nondistended, NBS Ext: no pedal edema bilaterally Psych: Frail and ill-appearing, somnolent keeping  his eyes closed but arousable.   Data Reviewed:  I have personally reviewed following labs    CBC Lab Results  Component Value Date   WBC 2.3 (L) 04/20/2024   RBC 1.65 (L) 04/20/2024   HGB 5.5 (LL) 04/20/2024   HCT 17.6 (L) 04/20/2024   MCV 106.7 (H) 04/20/2024   MCH 33.3 04/20/2024   PLT 137 (L) 04/20/2024   MCHC 31.3 04/20/2024   RDW 17.4 (H) 04/20/2024   LYMPHSABS 0.3 (L) 04/10/2024   MONOABS 0.8 04/10/2024   EOSABS 0.1 04/10/2024   BASOSABS 0.0 04/10/2024     Last metabolic panel Lab Results  Component Value Date   NA 143 04/20/2024   K 3.5 04/20/2024   CL 109 04/20/2024   CO2 27 04/20/2024   BUN 25 (H) 04/20/2024   CREATININE 1.66 (H) 04/20/2024   GLUCOSE 235 (H) 04/20/2024   GFRNONAA 43 (L) 04/20/2024   GFRAA 46 (L) 06/16/2015   CALCIUM 8.3 (L) 04/20/2024   PROT 7.4 04/03/2024   ALBUMIN 3.2 (L) 04/03/2024   BILITOT 0.5 04/03/2024   ALKPHOS 149 (H) 04/03/2024   AST 16 04/03/2024   ALT 24 04/03/2024   ANIONGAP 7 04/20/2024    CBG (last 3)  No results for input(s): GLUCAP in the last 72 hours.    Coagulation Profile: No results for input(s): INR, PROTIME in the last 168 hours.   Radiology Studies: I have personally reviewed the imaging studies  No results found.     Nydia Distance M.D. [facility] 04/25/2024, 1:44 PM  Available via Epic secure chat 7am-7pm After 7 pm, please refer to night coverage provider listed on amion.    "

## 2024-04-26 NOTE — Plan of Care (Signed)
 " Problem: Education: Goal: Ability to describe self-care measures that may prevent or decrease complications (Diabetes Survival Skills Education) will improve Outcome: Progressing Goal: Individualized Educational Video(s) Outcome: Progressing   Problem: Coping: Goal: Ability to adjust to condition or change in health will improve Outcome: Progressing   Problem: Fluid Volume: Goal: Ability to maintain a balanced intake and output will improve Outcome: Progressing   Problem: Health Behavior/Discharge Planning: Goal: Ability to identify and utilize available resources and services will improve Outcome: Progressing Goal: Ability to manage health-related needs will improve Outcome: Progressing   Problem: Metabolic: Goal: Ability to maintain appropriate glucose levels will improve Outcome: Progressing   Problem: Nutritional: Goal: Maintenance of adequate nutrition will improve Outcome: Progressing Goal: Progress toward achieving an optimal weight will improve Outcome: Progressing   Problem: Skin Integrity: Goal: Risk for impaired skin integrity will decrease Outcome: Progressing   Problem: Tissue Perfusion: Goal: Adequacy of tissue perfusion will improve Outcome: Progressing   Problem: Education: Goal: Ability to describe self-care measures that may prevent or decrease complications (Diabetes Survival Skills Education) will improve Outcome: Progressing Goal: Individualized Educational Video(s) Outcome: Progressing   Problem: Cardiac: Goal: Ability to maintain an adequate cardiac output will improve Outcome: Progressing   Problem: Health Behavior/Discharge Planning: Goal: Ability to identify and utilize available resources and services will improve Outcome: Progressing Goal: Ability to manage health-related needs will improve Outcome: Progressing   Problem: Fluid Volume: Goal: Ability to achieve a balanced intake and output will improve Outcome: Progressing    Problem: Metabolic: Goal: Ability to maintain appropriate glucose levels will improve Outcome: Progressing   Problem: Nutritional: Goal: Maintenance of adequate nutrition will improve Outcome: Progressing Goal: Maintenance of adequate weight for body size and type will improve Outcome: Progressing   Problem: Respiratory: Goal: Will regain and/or maintain adequate ventilation Outcome: Progressing   Problem: Urinary Elimination: Goal: Ability to achieve and maintain adequate renal perfusion and functioning will improve Outcome: Progressing   Problem: Education: Goal: Knowledge of General Education information will improve Description: Including pain rating scale, medication(s)/side effects and non-pharmacologic comfort measures Outcome: Progressing   Problem: Health Behavior/Discharge Planning: Goal: Ability to manage health-related needs will improve Outcome: Progressing   Problem: Clinical Measurements: Goal: Ability to maintain clinical measurements within normal limits will improve Outcome: Progressing Goal: Will remain free from infection Outcome: Progressing Goal: Diagnostic test results will improve Outcome: Progressing Goal: Respiratory complications will improve Outcome: Progressing Goal: Cardiovascular complication will be avoided Outcome: Progressing   Problem: Activity: Goal: Risk for activity intolerance will decrease Outcome: Progressing   Problem: Nutrition: Goal: Adequate nutrition will be maintained Outcome: Progressing   Problem: Coping: Goal: Level of anxiety will decrease Outcome: Progressing   Problem: Elimination: Goal: Will not experience complications related to bowel motility Outcome: Progressing Goal: Will not experience complications related to urinary retention Outcome: Progressing   Problem: Pain Managment: Goal: General experience of comfort will improve and/or be controlled Outcome: Progressing   Problem: Safety: Goal:  Ability to remain free from injury will improve Outcome: Progressing   Problem: Skin Integrity: Goal: Risk for impaired skin integrity will decrease Outcome: Progressing   Problem: Education: Goal: Knowledge of the prescribed therapeutic regimen will improve Outcome: Progressing   Problem: Bowel/Gastric: Goal: Gastrointestinal status for postoperative course will improve Outcome: Progressing   Problem: Cardiac: Goal: Ability to maintain an adequate cardiac output Outcome: Progressing Goal: Will show no evidence of cardiac arrhythmias Outcome: Progressing   Problem: Nutritional: Goal: Will attain and maintain optimal nutritional status Outcome:  Progressing   Problem: Neurological: Goal: Will regain or maintain usual level of consciousness Outcome: Progressing   Problem: Clinical Measurements: Goal: Ability to maintain clinical measurements within normal limits Outcome: Progressing Goal: Postoperative complications will be avoided or minimized Outcome: Progressing   "

## 2024-04-26 NOTE — Progress Notes (Signed)
 "          [facility]                                                                              Gene Fletcher, is a 74 y.o. male, DOB - May 27, 1950, FMW:[id number] Admit date - 04/03/2024    Outpatient Primary MD for the patient is [name], MD  LOS - 23  days  Chief Complaint  Patient presents with   Foot Pain       Brief summary   Patient is a 74 year old male with hypertension, HLD, DM type II, CKD 4, lung cancer-s/p radiation, chronic anemia presented with left foot wound infection with wet gangrene.  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  Further hospital course was complicated by worsening anemia, failure to thrive syndrome,  uremia. 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 Unfortunately, even with an Aranesp /B12/folic acid /iron , he continued to decline.  Palliative medicine was consulted, after GOC discussion with family on 1/26, he was transitioned to full comfort measures.    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 Continue comfort care goals   Multifactorial anemia -Secondary to anemia of CKD, acute illness, blood loss from recent surgery -Jehovah's Witness-refused PRBC transfusion -Unfortunately, hemoglobin continued to worsen in spite of Aranesp /IV iron /vitamin B12/folic acid  and minimization of blood draws -patient/family was aware of life-threatening and life disabling consequences.  Patient became more symptomatic as his anemia worsened. -Palliative medicine was consulted, after GOC discussion, transitioned to full comfort measures on 1/26.   - Labs 1/31 reviewed with family, Hb 5.5, creatinine 1.6   Nausea/vomiting-poor appetite - ongoing even prior to this hospitalization but worsened during this admission --abdominal x-rays were negative for SBO but did show some stool  burden, placed on MiraLAX /laxatives-without any significant improvement -- thought to be secondary to uremia.   DKA History of DM-2 Previously on insulin  infusion, subsequently transition to SQ insulin  - Continue comfort measures   Acute metabolic encephalopathy Secondary to diabetic foot/DKA/uremia-suspicion for some amount of cognitive issues as well. Supportive care.   HTN No longer on any antihypertensives -BP was low requiring the addition of midodrine  during hospitalization   History of PAD Supportive care   History of lung cancer with nonspecific right-sided x-ray changes. Supportive care   Goals of care - Family considering residential hospice, palliative medicine following.     Pressure Ulcer: Agree with assessment as outlined below. Wound 04/14/24 0400 Pressure Injury Sacrum Mid;Left Deep Tissue Pressure Injury - Purple or maroon localized area of discolored intact skin or blood-filled blister due to damage of underlying soft tissue from pressure and/or shear. (Active)   Estimated body mass index is 20.81 kg/m as calculated from the following:   Height as of this encounter: 5' 10 (1.778 m).   Weight as of this encounter: 65.8 kg.  Code Status: DNR comfort care DVT Prophylaxis:     Level of Care: Level of care: Palliative Care Family Communication: Updated patient's brother at the bedside Disposition Plan:  Remains inpatient appropriate: TBD.  Per [facility], patient does not meet criteria to residential hospice.  Patient's daughter and brother live out of [place] and wife has dementia hence per patient's brother, home is not an option.   Procedures:  Right BKA 1/16  Consultants:   Palliative medicine Orthopedics  Antimicrobials:   Anti-infectives (From admission, onward)    Start     Dose/Rate Route Frequency Ordered Stop   04/05/24 1500  vancomycin  (VANCOCIN ) IVPB 1000 mg/200 mL premix  Status:  Discontinued        1,000 mg 200 mL/hr over 60  Minutes Intravenous Every 48 hours 04/03/24 1458 04/04/24 1059   04/05/24 0100  vancomycin  (VANCOCIN ) IVPB 1000 mg/200 mL premix  Status:  Discontinued        1,000 mg 200 mL/hr over 60 Minutes Intravenous Every 36 hours 04/04/24 1059 04/04/24 1349   04/04/24 1400  linezolid  (ZYVOX ) tablet 600 mg        600 mg Oral Every 12 hours 04/04/24 1349 04/08/24 1821   04/03/24 2000  metroNIDAZOLE  (FLAGYL ) IVPB 500 mg        500 mg 100 mL/hr over 60 Minutes Intravenous Every 12 hours 04/03/24 1448 04/08/24 2108   04/03/24 1800  ceFEPIme  (MAXIPIME ) 2 g in sodium chloride  0.9 % 100 mL IVPB        2 g 200 mL/hr over 30 Minutes Intravenous Every 24 hours 04/03/24 1454 04/08/24 1859   04/03/24 1145  piperacillin -tazobactam (ZOSYN ) IVPB 3.375 g        3.375 g 100 mL/hr over 30 Minutes Intravenous  Once 04/03/24 1132 04/03/24 1226   04/03/24 1130  vancomycin  (VANCOCIN ) IVPB 1000 mg/200 mL premix  Status:  Discontinued        1,000 mg 200 mL/hr over 60 Minutes Intravenous  Once 04/03/24 1121 04/03/24 1122   04/03/24 1130  vancomycin  (VANCOREADY) IVPB 1500 mg/300 mL        1,500 mg 150 mL/hr over 120 Minutes Intravenous  Once 04/03/24 1122 04/03/24 1456          Medications  antiseptic oral rinse  15 mL Topical BID   bisacodyl   10 mg Rectal Once   LORazepam   0.5 mg Intravenous Q6H   prochlorperazine   10 mg Intravenous Q6H   tamsulosin   0.4 mg Oral Daily      Subjective:   Gene Fletcher was seen and examined today.  Alert and awake, no acute complaints.  Brother at the bedside.  Did not sleep well last night.   Objective:   Vitals:   04/24/24 0517 04/24/24 0900 04/25/24 0518 04/26/24 0540  BP: 115/65 107/62 134/72 115/62  Pulse: 76 83 (!) 110 89  Resp: 18 16 18 18   Temp: (!) 97.4 F (36.3 C) 98 F (36.7 C) 98.1 F (36.7 C) 98.8 F (37.1 C)  TempSrc: Oral Oral Oral   SpO2: 100% 100% 100% 100%  Weight:      Height:        Intake/Output Summary (Last 24 hours) at 04/26/2024  1421 Last data filed at 04/26/2024 1130 Gross per 24 hour  Intake 240 ml  Output 2750 ml  Net -2510 ml     Wt Readings from Last 3 Encounters:  04/05/24 65.8 kg  04/02/24 65.8 kg  03/26/24 69.9 kg    Physical Exam General: Alert and awake, NAD, appears comfortable Cardiovascular: S1 S2 clear, RRR.  Respiratory: CTAB, no wheezing Gastrointestinal: Soft, nontender, nondistended, NBS Ext: no pedal edema bilaterally Psych: Alert  and awake, frail and ill-appearing.    Data Reviewed:  I have personally reviewed following labs    CBC Lab Results  Component Value Date   WBC 2.3 (L) 04/20/2024   RBC 1.65 (L) 04/20/2024   HGB 5.5 (LL) 04/20/2024   HCT 17.6 (L) 04/20/2024   MCV 106.7 (H) 04/20/2024   MCH 33.3 04/20/2024   PLT 137 (L) 04/20/2024   MCHC 31.3 04/20/2024   RDW 17.4 (H) 04/20/2024   LYMPHSABS 0.3 (L) 04/10/2024   MONOABS 0.8 04/10/2024   EOSABS 0.1 04/10/2024   BASOSABS 0.0 04/10/2024     Last metabolic panel Lab Results  Component Value Date   NA 143 04/20/2024   K 3.5 04/20/2024   CL 109 04/20/2024   CO2 27 04/20/2024   BUN 25 (H) 04/20/2024   CREATININE 1.66 (H) 04/20/2024   GLUCOSE 235 (H) 04/20/2024   GFRNONAA 43 (L) 04/20/2024   GFRAA 46 (L) 06/16/2015   CALCIUM 8.3 (L) 04/20/2024   PROT 7.4 04/03/2024   ALBUMIN 3.2 (L) 04/03/2024   BILITOT 0.5 04/03/2024   ALKPHOS 149 (H) 04/03/2024   AST 16 04/03/2024   ALT 24 04/03/2024   ANIONGAP 7 04/20/2024    CBG (last 3)  No results for input(s): GLUCAP in the last 72 hours.    Coagulation Profile: No results for input(s): INR, PROTIME in the last 168 hours.   Radiology Studies: I have personally reviewed the imaging studies  No results found.     Nydia Distance M.D. [facility] 04/26/2024, 2:21 PM  Available via Epic secure chat 7am-7pm After 7 pm, please refer to night coverage provider listed on amion.    "

## 2024-04-26 NOTE — Progress Notes (Signed)
 [facility] [phone] [facility]  Hospice hospital liaison note   Received previous request for family interest in hospice inpatient unit.    At this time patient does not meet criteria for hospice inpatient unit.    He is appropriate for hospice services in the home or LTC facility and we will follow to assess for decline and inpatient hospice appropriateness.    Thank you for the opportunity to participate in this patient's care.    Eleanor Nail, LPN Hospice hospital liaison [phone]

## 2024-04-26 NOTE — Progress Notes (Incomplete)
 "                                                                                                                                                                                                         Daily Progress Note   Patient Name: Gene Fletcher       Date: 04/26/2024 DOB: [birth year]  Age: 74 y.o. MRN#: [id number] Attending Physician: Davia Nydia POUR, MD Primary Care Physician: [name], MD Admit Date: 04/03/2024  Reason for Consultation/Follow-up: {Reason for Consult:23484}  Objective: I have reviewed medical records including: EPIC notes: MAR: Patient is receiving these palliative interventions for symptom management with an intent to improve quality of life  Available advanced directives in ACP: Labs personally reviewed:  Subjective: Received report from primary RN - ***  All questions and concerns addressed. Encouraged to call with questions and/or concerns. PMT card provided.  Length of Stay: 23  Current Medications: Scheduled Meds:   antiseptic oral rinse  15 mL Topical BID   bisacodyl   10 mg Rectal Once   LORazepam   0.5 mg Intravenous Q6H   nystatin   5 mL Mouth/Throat QID   prochlorperazine   10 mg Intravenous Q6H   tamsulosin   0.4 mg Oral Daily    Continuous Infusions:   PRN Meds: acetaminophen  **OR** acetaminophen , artificial tears, chlorproMAZINE  (THORAZINE ) injection, diphenhydrAMINE , feeding supplement (GLUCERNA SHAKE), glycopyrrolate  **OR** glycopyrrolate  **OR** glycopyrrolate , haloperidol  **OR** haloperidol  **OR** haloperidol  lactate, HYDROmorphone  (DILAUDID ) injection, LORazepam  **OR** LORazepam  **OR** LORazepam , metoCLOPramide  (REGLAN ) injection, ondansetron  **OR** ondansetron  (ZOFRAN ) IV, oxyCODONE , phenol  Physical Exam          Vital Signs: BP 115/62 (BP Location: Right Arm)   Pulse 89   Temp 98.8 F (37.1 C)   Resp 18   Ht 5' 10 (1.778 m)   Wt 65.8 kg   SpO2 100%   BMI 20.81 kg/m  SpO2: SpO2: 100 % O2 Device: O2 Device: Room  Air O2 Flow Rate: O2 Flow Rate (L/min): 2 L/min  Intake/output summary:  Intake/Output Summary (Last 24 hours) at 04/26/2024 1213 Last data filed at 04/26/2024 0900 Gross per 24 hour  Intake 240 ml  Output 1550 ml  Net -1310 ml   LBM: Last BM Date : 04/15/24 Baseline Weight: Weight: 65.8 kg Most recent weight: Weight: 65.8 kg       Palliative Assessment/Data:      Patient Active Problem List   Diagnosis Date Noted   Gangrene of right foot (HCC) 04/03/2024   No transfusions per religious beliefs (Jehovahs Witnesses) 04/03/2024   Diabetic foot infection (HCC) 04/03/2024  DKA (diabetic ketoacidosis) (HCC) 04/03/2024   Malignant neoplasm of lower lobe of right lung (HCC) 11/07/2023   Malignant 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) 10/16/2023   Lung nodules 10/04/2023   Adenopathy 10/04/2023   Insulin  long-term use (HCC) 07/04/2022   Leg edema 07/04/2022   Chronic kidney disease, stage 4 (severe) (HCC) 07/04/2022   Noncompliance with treatment 02/12/2018   Anemia 07/06/2015   Diabetic renal disease (HCC) 12/23/2014   Encounter for general adult medical examination without abnormal findings 12/16/2014   Diabetic retinopathy associated with type 2 diabetes mellitus (HCC) 12/18/2013   Type 2 diabetes mellitus without complications (HCC) 09/24/2009   Hyperlipidemia 09/24/2009   Hyperglycemia due to type 2 diabetes mellitus (HCC) 09/24/2009   Essential hypertension 09/24/2009   Encounter for other general examination 09/24/2009    Palliative Care Assessment & Plan   Patient Profile: ***  Assessment: Principal Problem:   Diabetic foot infection (HCC) Active Problems:   Type 2 diabetes mellitus without complications (HCC)   Hyperlipidemia   Essential hypertension   Chronic kidney disease, stage 4 (severe) (HCC)   Anemia   Malignant 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)   Gangrene of right foot (HCC)   No transfusions per religious beliefs (Jehovahs  Witnesses)   DKA (diabetic ketoacidosis) (HCC)   Recommendations/Plan: ***  Goals of Care and Additional Recommendations: Limitations on Scope of Treatment: {Recommended Scope and Preferences:21019}  Code Status:    Code Status Orders  (From admission, onward)           Start     Ordered   04/15/24 2111  Do not attempt resuscitation (DNR) - Comfort care  (Code Status)  Continuous       Question Answer Comment  If patient has no pulse and is not breathing Do Not Attempt Resuscitation   In Pre-Arrest Conditions (Patient Is Breathing and Has a Pulse) Provide comfort measures. Relieve any mechanical airway obstruction. Avoid transfer unless required for comfort.   Consent: Discussion documented in EHR or advanced directives reviewed      04/15/24 2110           Code Status History     Date Active Date Inactive Code Status Order ID Comments User Context   04/15/2024 1656 04/15/2024 2110 Limited: Do not attempt resuscitation (DNR) -DNR-LIMITED -Do Not Intubate/DNI  [id number]  Claudene Jeoffrey HERO, NP Inpatient   04/15/2024 0759 04/15/2024 1656 Limited: Do not attempt resuscitation (DNR) -DNR-LIMITED -Do Not Intubate/DNI  [id number]  Sherlon Brayton RAMAN, MD Inpatient   04/03/2024 1448 04/15/2024 0759 Full Code [id number]  Seena Marsa NOVAK, MD ED       Prognosis:  {Palliative Care Prognosis:23504}  Discharge Planning: {Palliative dispostion:23505}  Care plan was discussed with ***  Thank you for allowing the Palliative Medicine Team to assist in the care of this patient.  I personally spent a total of *** minutes in the care of the patient today including {Time Based Coding:210964241}.     Jeoffrey HERO Claudene, NP  Please contact Palliative Medicine Team phone at [phone] for questions and concerns.       "

## 2024-04-26 NOTE — TOC Progression Note (Addendum)
 Transition of Care (TOC) - Progression Note   Received secure chat from Palliative Team to discuss other residential hospice options .  NCM went to bedside , patient's brother present. He called Bernel she is at doctors appointment with her mother , asked NCM to call her back in 30 minutes.   NCM discussed with patient's brother. [facility] in [place] and also [facility] in [place] and [place]. Brother asking if patient can stay at [facility] until [facility] accepts him. NCM explained different levels of care.   1450 NCM called Bernel and left voicemail.   1520 Bernel called back. NCM discussed above. They do have family in [place] who could go visit him if he was in a residential hospice there . She wants tonight to think about it and talk to [facility] with palliative in morning . Secure chatted team   NCM did explain to Bernel if she wants referrals sent to residential hospice in [place] , [facility] can sent referral. [facility] ambulance will transport and file with insurance, if transport is greater then 50 miles she would have to pay upfront.  Bernel will discuss with family this evening    Patient Details  Name: Gene Fletcher MRN: [id number] Date of Birth: June 06, 1950  Transition of Care [facility]) CM/SW Contact  Brennyn Ortlieb, Powell Jansky, RN Phone Number: 04/26/2024, 2:50 PM  Clinical Narrative:       Expected Discharge Plan:  (comfort care) Barriers to Discharge: Continued Medical Work up (comfort care in hospital)               Expected Discharge Plan and Services In-house Referral: Clinical Social Work   Post Acute Care Choice: Skilled Nursing Facility Living arrangements for the past 2 months: Single Family Home                                       Social Drivers of Health (SDOH) Interventions SDOH Screenings   Food Insecurity: No Food Insecurity (04/03/2024)  Housing: Low Risk (04/03/2024)  Transportation Needs: No Transportation Needs  (04/03/2024)  Utilities: Not At Risk (04/03/2024)  Depression (PHQ2-9): Low Risk (11/07/2023)  Social Connections: Socially Integrated (04/03/2024)  Tobacco Use: Low Risk (04/05/2024)    Readmission Risk Interventions     No data to display

## 2024-07-01 ENCOUNTER — Inpatient Hospital Stay: Admitting: Internal Medicine

## 2024-07-01 ENCOUNTER — Inpatient Hospital Stay
